# Patient Record
Sex: Female | Born: 1935 | Race: White | Hispanic: No | Smoking: Never smoker
Health system: Southern US, Community
[De-identification: ages and names within clinical notes are randomized; demographics above are authoritative.]

## PROBLEM LIST (undated history)

## (undated) DIAGNOSIS — T7840XA Allergy, unspecified, initial encounter: Secondary | ICD-10-CM

## (undated) DIAGNOSIS — Z789 Other specified health status: Secondary | ICD-10-CM

## (undated) DIAGNOSIS — D649 Anemia, unspecified: Secondary | ICD-10-CM

## (undated) DIAGNOSIS — I700 Atherosclerosis of aorta: Secondary | ICD-10-CM

## (undated) DIAGNOSIS — I4891 Unspecified atrial fibrillation: Secondary | ICD-10-CM

## (undated) DIAGNOSIS — M109 Gout, unspecified: Secondary | ICD-10-CM

## (undated) DIAGNOSIS — Z972 Presence of dental prosthetic device (complete) (partial): Secondary | ICD-10-CM

## (undated) DIAGNOSIS — I10 Essential (primary) hypertension: Secondary | ICD-10-CM

## (undated) DIAGNOSIS — H269 Unspecified cataract: Secondary | ICD-10-CM

## (undated) DIAGNOSIS — R0989 Other specified symptoms and signs involving the circulatory and respiratory systems: Secondary | ICD-10-CM

## (undated) DIAGNOSIS — Z7901 Long term (current) use of anticoagulants: Secondary | ICD-10-CM

## (undated) DIAGNOSIS — I779 Disorder of arteries and arterioles, unspecified: Secondary | ICD-10-CM

## (undated) DIAGNOSIS — E785 Hyperlipidemia, unspecified: Secondary | ICD-10-CM

## (undated) DIAGNOSIS — M810 Age-related osteoporosis without current pathological fracture: Secondary | ICD-10-CM

## (undated) DIAGNOSIS — N289 Disorder of kidney and ureter, unspecified: Secondary | ICD-10-CM

## (undated) DIAGNOSIS — N184 Chronic kidney disease, stage 4 (severe): Secondary | ICD-10-CM

## (undated) DIAGNOSIS — K449 Diaphragmatic hernia without obstruction or gangrene: Secondary | ICD-10-CM

## (undated) DIAGNOSIS — M48061 Spinal stenosis, lumbar region without neurogenic claudication: Secondary | ICD-10-CM

## (undated) DIAGNOSIS — K219 Gastro-esophageal reflux disease without esophagitis: Secondary | ICD-10-CM

## (undated) DIAGNOSIS — M1990 Unspecified osteoarthritis, unspecified site: Secondary | ICD-10-CM

## (undated) DIAGNOSIS — I2510 Atherosclerotic heart disease of native coronary artery without angina pectoris: Secondary | ICD-10-CM

## (undated) DIAGNOSIS — Z974 Presence of external hearing-aid: Secondary | ICD-10-CM

## (undated) DIAGNOSIS — K52832 Lymphocytic colitis: Secondary | ICD-10-CM

## (undated) DIAGNOSIS — N183 Chronic kidney disease, stage 3 unspecified: Secondary | ICD-10-CM

## (undated) DIAGNOSIS — G459 Transient cerebral ischemic attack, unspecified: Secondary | ICD-10-CM

## (undated) DIAGNOSIS — Z0282 Encounter for adoption services: Secondary | ICD-10-CM

## (undated) DIAGNOSIS — E739 Lactose intolerance, unspecified: Secondary | ICD-10-CM

## (undated) DIAGNOSIS — E538 Deficiency of other specified B group vitamins: Secondary | ICD-10-CM

## (undated) DIAGNOSIS — I209 Angina pectoris, unspecified: Secondary | ICD-10-CM

## (undated) HISTORY — PX: TONSILLECTOMY: SUR1361

## (undated) HISTORY — PX: TONSILLECTOMY AND ADENOIDECTOMY: SUR1326

## (undated) HISTORY — PX: TUBAL LIGATION: SHX77

## (undated) HISTORY — DX: Unspecified atrial fibrillation: I48.91

## (undated) HISTORY — DX: Deficiency of other specified B group vitamins: E53.8

## (undated) HISTORY — DX: Allergy, unspecified, initial encounter: T78.40XA

## (undated) HISTORY — PX: BUNIONECTOMY: SHX129

## (undated) HISTORY — PX: BREAST EXCISIONAL BIOPSY: SUR124

## (undated) HISTORY — PX: ABDOMINAL HYSTERECTOMY: SHX81

## (undated) HISTORY — PX: REPLACEMENT TOTAL KNEE: SUR1224

## (undated) HISTORY — DX: Hyperlipidemia, unspecified: E78.5

## (undated) HISTORY — PX: TOTAL ABDOMINAL HYSTERECTOMY W/ BILATERAL SALPINGOOPHORECTOMY: SHX83

---

## 2004-08-24 HISTORY — PX: KNEE ARTHROSCOPY: SUR90

## 2007-02-09 HISTORY — PX: LEFT HEART CATH AND CORONARY ANGIOGRAPHY: CATH118249

## 2008-02-09 DIAGNOSIS — I5189 Other ill-defined heart diseases: Secondary | ICD-10-CM

## 2008-02-09 HISTORY — PX: LEFT HEART CATH AND CORONARY ANGIOGRAPHY: CATH118249

## 2008-02-09 HISTORY — DX: Other ill-defined heart diseases: I51.89

## 2008-04-05 DIAGNOSIS — M48061 Spinal stenosis, lumbar region without neurogenic claudication: Secondary | ICD-10-CM | POA: Insufficient documentation

## 2008-07-02 HISTORY — PX: COLONOSCOPY: SHX174

## 2008-07-13 DIAGNOSIS — K52832 Lymphocytic colitis: Secondary | ICD-10-CM | POA: Insufficient documentation

## 2010-07-29 DIAGNOSIS — E538 Deficiency of other specified B group vitamins: Secondary | ICD-10-CM | POA: Insufficient documentation

## 2011-09-09 DIAGNOSIS — N184 Chronic kidney disease, stage 4 (severe): Secondary | ICD-10-CM | POA: Insufficient documentation

## 2011-11-23 DIAGNOSIS — Z789 Other specified health status: Secondary | ICD-10-CM | POA: Insufficient documentation

## 2011-12-28 DIAGNOSIS — I2510 Atherosclerotic heart disease of native coronary artery without angina pectoris: Secondary | ICD-10-CM | POA: Insufficient documentation

## 2012-01-06 DIAGNOSIS — E785 Hyperlipidemia, unspecified: Secondary | ICD-10-CM | POA: Insufficient documentation

## 2012-12-12 DIAGNOSIS — M19031 Primary osteoarthritis, right wrist: Secondary | ICD-10-CM | POA: Insufficient documentation

## 2013-09-11 DIAGNOSIS — R0989 Other specified symptoms and signs involving the circulatory and respiratory systems: Secondary | ICD-10-CM | POA: Insufficient documentation

## 2013-09-11 DIAGNOSIS — M79606 Pain in leg, unspecified: Secondary | ICD-10-CM | POA: Insufficient documentation

## 2014-03-07 DIAGNOSIS — I209 Angina pectoris, unspecified: Secondary | ICD-10-CM | POA: Insufficient documentation

## 2014-04-10 DIAGNOSIS — S62009A Unspecified fracture of navicular [scaphoid] bone of unspecified wrist, initial encounter for closed fracture: Secondary | ICD-10-CM | POA: Insufficient documentation

## 2014-04-26 DIAGNOSIS — M1710 Unilateral primary osteoarthritis, unspecified knee: Secondary | ICD-10-CM | POA: Insufficient documentation

## 2014-04-26 HISTORY — PX: TOTAL KNEE ARTHROPLASTY: SHX125

## 2014-04-27 DIAGNOSIS — Z96659 Presence of unspecified artificial knee joint: Secondary | ICD-10-CM

## 2014-05-15 DIAGNOSIS — M810 Age-related osteoporosis without current pathological fracture: Secondary | ICD-10-CM | POA: Insufficient documentation

## 2014-09-30 DIAGNOSIS — K2970 Gastritis, unspecified, without bleeding: Secondary | ICD-10-CM | POA: Insufficient documentation

## 2015-09-23 DIAGNOSIS — J0110 Acute frontal sinusitis, unspecified: Secondary | ICD-10-CM | POA: Insufficient documentation

## 2015-09-23 DIAGNOSIS — R059 Cough, unspecified: Secondary | ICD-10-CM | POA: Insufficient documentation

## 2016-02-28 DIAGNOSIS — I779 Disorder of arteries and arterioles, unspecified: Secondary | ICD-10-CM | POA: Insufficient documentation

## 2017-06-21 DIAGNOSIS — S62102D Fracture of unspecified carpal bone, left wrist, subsequent encounter for fracture with routine healing: Secondary | ICD-10-CM | POA: Insufficient documentation

## 2017-08-18 DIAGNOSIS — R21 Rash and other nonspecific skin eruption: Secondary | ICD-10-CM | POA: Insufficient documentation

## 2017-08-18 DIAGNOSIS — R490 Dysphonia: Secondary | ICD-10-CM | POA: Insufficient documentation

## 2018-04-28 DIAGNOSIS — I872 Venous insufficiency (chronic) (peripheral): Secondary | ICD-10-CM | POA: Insufficient documentation

## 2018-04-28 DIAGNOSIS — J309 Allergic rhinitis, unspecified: Secondary | ICD-10-CM | POA: Insufficient documentation

## 2018-04-28 DIAGNOSIS — K219 Gastro-esophageal reflux disease without esophagitis: Secondary | ICD-10-CM | POA: Insufficient documentation

## 2018-07-24 DIAGNOSIS — G459 Transient cerebral ischemic attack, unspecified: Secondary | ICD-10-CM

## 2018-07-24 HISTORY — DX: Transient cerebral ischemic attack, unspecified: G45.9

## 2018-08-19 ENCOUNTER — Encounter: Payer: Self-pay | Admitting: Intensive Care

## 2018-08-19 ENCOUNTER — Observation Stay: Payer: Medicare Other

## 2018-08-19 ENCOUNTER — Emergency Department: Payer: Medicare Other

## 2018-08-19 ENCOUNTER — Observation Stay
Admission: EM | Admit: 2018-08-19 | Discharge: 2018-08-20 | Disposition: A | Discharge status: Home / Self Care | Payer: Medicare Other | Attending: Internal Medicine | Admitting: Internal Medicine

## 2018-08-19 ENCOUNTER — Other Ambulatory Visit: Payer: Self-pay

## 2018-08-19 DIAGNOSIS — Z7951 Long term (current) use of inhaled steroids: Secondary | ICD-10-CM | POA: Diagnosis not present

## 2018-08-19 DIAGNOSIS — R262 Difficulty in walking, not elsewhere classified: Secondary | ICD-10-CM | POA: Insufficient documentation

## 2018-08-19 DIAGNOSIS — Z7982 Long term (current) use of aspirin: Secondary | ICD-10-CM | POA: Diagnosis not present

## 2018-08-19 DIAGNOSIS — Z66 Do not resuscitate: Secondary | ICD-10-CM | POA: Insufficient documentation

## 2018-08-19 DIAGNOSIS — Z79899 Other long term (current) drug therapy: Secondary | ICD-10-CM | POA: Diagnosis not present

## 2018-08-19 DIAGNOSIS — I10 Essential (primary) hypertension: Secondary | ICD-10-CM | POA: Diagnosis not present

## 2018-08-19 DIAGNOSIS — G459 Transient cerebral ischemic attack, unspecified: Secondary | ICD-10-CM | POA: Diagnosis not present

## 2018-08-19 DIAGNOSIS — N289 Disorder of kidney and ureter, unspecified: Secondary | ICD-10-CM | POA: Diagnosis not present

## 2018-08-19 DIAGNOSIS — I639 Cerebral infarction, unspecified: Secondary | ICD-10-CM

## 2018-08-19 DIAGNOSIS — M6281 Muscle weakness (generalized): Secondary | ICD-10-CM | POA: Insufficient documentation

## 2018-08-19 HISTORY — DX: Essential (primary) hypertension: I10

## 2018-08-19 HISTORY — DX: Disorder of kidney and ureter, unspecified: N28.9

## 2018-08-19 LAB — DIFFERENTIAL
Abs Immature Granulocytes: 0.02 10*3/uL (ref 0.00–0.07)
Basophils Absolute: 0.1 10*3/uL (ref 0.0–0.1)
Basophils Relative: 2 %
EOS ABS: 0.2 10*3/uL (ref 0.0–0.5)
Eosinophils Relative: 3 %
Immature Granulocytes: 0 %
Lymphocytes Relative: 17 %
Lymphs Abs: 1.1 10*3/uL (ref 0.7–4.0)
Monocytes Absolute: 0.6 10*3/uL (ref 0.1–1.0)
Monocytes Relative: 10 %
Neutro Abs: 4.4 10*3/uL (ref 1.7–7.7)
Neutrophils Relative %: 68 %

## 2018-08-19 LAB — COMPREHENSIVE METABOLIC PANEL
ALT: 10 U/L (ref 0–44)
AST: 19 U/L (ref 15–41)
Albumin: 4.1 g/dL (ref 3.5–5.0)
Alkaline Phosphatase: 62 U/L (ref 38–126)
Anion gap: 7 (ref 5–15)
BUN: 32 mg/dL — AB (ref 8–23)
CO2: 24 mmol/L (ref 22–32)
Calcium: 9.3 mg/dL (ref 8.9–10.3)
Chloride: 109 mmol/L (ref 98–111)
Creatinine, Ser: 1.28 mg/dL — ABNORMAL HIGH (ref 0.44–1.00)
GFR calc Af Amer: 44 mL/min — ABNORMAL LOW (ref >60)
GFR calc non Af Amer: 38 mL/min — ABNORMAL LOW (ref >60)
GLUCOSE: 103 mg/dL — AB (ref 70–99)
Potassium: 4.3 mmol/L (ref 3.5–5.1)
Sodium: 140 mmol/L (ref 135–145)
Total Bilirubin: 0.8 mg/dL (ref 0.3–1.2)
Total Protein: 6.7 g/dL (ref 6.5–8.1)

## 2018-08-19 LAB — CBC
HCT: 38.2 % (ref 36.0–46.0)
Hemoglobin: 12.2 g/dL (ref 12.0–15.0)
MCH: 31 pg (ref 26.0–34.0)
MCHC: 31.9 g/dL (ref 30.0–36.0)
MCV: 97.2 fL (ref 80.0–100.0)
PLATELETS: 295 10*3/uL (ref 150–400)
RBC: 3.93 MIL/uL (ref 3.87–5.11)
RDW: 12.9 % (ref 11.5–15.5)
WBC: 6.4 10*3/uL (ref 4.0–10.5)
nRBC: 0 % (ref 0.0–0.2)

## 2018-08-19 LAB — PROTIME-INR
INR: 1.01
Prothrombin Time: 13.2 seconds (ref 11.4–15.2)

## 2018-08-19 LAB — TROPONIN I

## 2018-08-19 LAB — APTT: aPTT: 30 seconds (ref 24–36)

## 2018-08-19 MED ORDER — ACETAMINOPHEN 160 MG/5ML PO SOLN
650.0000 mg | ORAL | Status: DC | PRN
  Filled 2018-08-19: qty 20.3

## 2018-08-19 MED ORDER — ACETAMINOPHEN 325 MG PO TABS
650.0000 mg | ORAL_TABLET | ORAL | Status: DC | PRN
  Administered 2018-08-19: 18:00:00 650 mg via ORAL
  Filled 2018-08-19: qty 2

## 2018-08-19 MED ORDER — ACETAMINOPHEN 325 MG PO TABS: 650.0000 mg | ORAL_TABLET | Freq: Once | ORAL | Status: DC

## 2018-08-19 MED ORDER — ACETAMINOPHEN 650 MG RE SUPP: 650.0000 mg | RECTAL | Status: DC | PRN

## 2018-08-19 MED ORDER — STROKE: EARLY STAGES OF RECOVERY BOOK
Freq: Once | Status: AC
  Administered 2018-08-19: 21:00:00

## 2018-08-19 MED ORDER — ASPIRIN 300 MG RE SUPP: 300.0000 mg | Freq: Every day | RECTAL | Status: DC

## 2018-08-19 MED ORDER — ASPIRIN 325 MG PO TABS
325.0000 mg | ORAL_TABLET | Freq: Every day | ORAL | Status: DC
  Administered 2018-08-19 – 2018-08-20 (×2): 325 mg via ORAL
  Filled 2018-08-19 (×2): qty 1

## 2018-08-19 MED ORDER — ENOXAPARIN SODIUM 30 MG/0.3ML ~~LOC~~ SOLN
30.0000 mg | SUBCUTANEOUS | Status: DC
  Administered 2018-08-19: 21:00:00 30 mg via SUBCUTANEOUS
  Filled 2018-08-19: qty 0.3

## 2018-08-19 NOTE — [facility] Notes (Signed)
Advanced care plan.  Purpose of the Encounter: CODE STATUS  Parties in Attendance: Patient and family  Patient's Decision Capacity: Good  Subjective/Patient's story: Presented to the emergency room because of difficulty getting words out   Objective/Medical story Needs stroke work-up Needs MRI brain, carotid ultrasound echocardiogram Needs neurology evaluation  Goals of care determination:  Advance care directives goals of care and treatment plan discussed Patient does not want CPR, intubation ventilator if the need arises   CODE STATUS: DNR   Time spent discussing advanced care planning: 16 minutes

## 2018-08-19 NOTE — ED Notes (Signed)
Pt up to toilet 

## 2018-08-19 NOTE — ED Provider Notes (Addendum)
[facility] Emergency Department Provider Note  Time seen: 12:00 PM  I have reviewed the triage vital signs and the nursing notes.   HISTORY  Chief Complaint Altered Mental Status    HPI Brittney [name] is a 84 y.o. female with a past medical history of hypertension, presents to the emergency department for difficulty speaking.  According to the patient and family at approximate 9:30 AM this morning patient had acute onset of difficulty speaking and developed a headache.  Patient states she was trying to talk but cannot get words out.  Patient's family states she was speaking incorrectly.  Was speaking very slowly and using the wrong words but the patient could not tell that she was using the wrong words.  Symptoms lasted approximately 30 to 45 minutes and then resolved.  No history of stroke or mini stroke in the past.  Patient takes a baby aspirin every morning as her only anticoagulation.  Patient states she feels back to normal now besides a very mild headache.   Past Medical History:  Diagnosis Date  . Hypertension   . Renal disorder     There are no active problems to display for this patient.   History reviewed. No pertinent surgical history.  Prior to Admission medications   Not on File    Allergies  Allergen Reactions  . Azithromycin   . Baclofen   . Iodine     CKD  . Lisinopril   . Sulfa Antibiotics     History reviewed. No pertinent family history.  Social History Social History   Tobacco Use  . Smoking status: Never Smoker  . Smokeless tobacco: Never Used  Substance Use Topics  . Alcohol use: Not Currently    Frequency: Never  . Drug use: Never    Review of Systems Constitutional: Negative for fever Cardiovascular: Negative for chest pain. Respiratory: Negative for shortness of breath. Gastrointestinal: Negative for abdominal pain, vomiting  Musculoskeletal: Negative for musculoskeletal complaints Skin: Negative for  skin complaints  Neurological: Mild headache All other ROS negative  ____________________________________________   PHYSICAL EXAM:  VITAL SIGNS: ED Triage Vitals  Enc Vitals Group     BP 08/19/18 1044 (!) 195/73     Pulse Rate 08/19/18 1044 63     Resp 08/19/18 1044 18     Temp 08/19/18 1044 (!) 97.4 F (36.3 C)     Temp Source 08/19/18 1044 Oral     SpO2 08/19/18 1044 97 %     Weight 08/19/18 1045 145 lb (65.8 kg)     Height 08/19/18 1045 5' (1.524 m)     Head Circumference --      Peak Flow --      Pain Score 08/19/18 1045 5     Pain Loc --      Pain Edu? --      Excl. in [place]? --    Constitutional: Alert and oriented. Well appearing and in no distress. Eyes: Normal exam ENT   Head: Normocephalic and atraumatic.   Mouth/Throat: Mucous membranes are moist. Cardiovascular: Normal rate, regular rhythm. Respiratory: Normal respiratory effort without tachypnea nor retractions. Breath sounds are clear  Gastrointestinal: Soft and nontender. No distention.   Musculoskeletal: Nontender with normal range of motion in all extremities. Neurologic:  Normal speech and language. No gross focal neurologic deficits.  Equal grip strength bilaterally.  No pronator drift.  5/5 motor in all extremities.  No lower extremity drift.  Clear speech.  Cranial nerves intact. Skin:  Skin is warm, dry and intact.  Psychiatric: Mood and affect are normal.   ____________________________________________    EKG  EKG viewed and interpreted by myself shows a normal sinus rhythm at 64 bpm with a slightly widened QRS, left axis deviation, largely normal intervals, nonspecific ST changes.  ____________________________________________    RADIOLOGY  CT scan of the head is negative  ____________________________________________   INITIAL IMPRESSION / ASSESSMENT AND PLAN / ED COURSE  Pertinent labs & imaging results that were available during my care of the patient were reviewed by me and  considered in my medical decision making (see chart for details).  Patient presents to the emergency department with acute onset of speech difficulty at 9:30 AM today.  Differential would include TIA, CVA, ICH, complex migraine.  Patient CT scan is negative, exam including neurological exam is reassuring.  Highly suspect transient ischemic attack.  NIH stroke scale of 0 currently.  Lab work is been nonrevealing.  Patient will be admitted to the hospital service for continued work-up.  Patient and family agreeable to plan of care.  ____________________________________________   FINAL CLINICAL IMPRESSION(S) / ED DIAGNOSES  TIA   Harvest Dark, MD 08/19/18 1203    Harvest Dark, MD 08/19/18 1204

## 2018-08-19 NOTE — ED Triage Notes (Signed)
Patient reports around 0930 having confusion, aphasia, and daughter reports some slurred speech. Patients only c/o now is slight headache. Speech is clear. No weakness. No facial droop. A&O x4 at this time. HX CKD

## 2018-08-19 NOTE — ED Notes (Signed)
Informed RN Jinny Blossom of being in code red surge and that patient would need to be transported by 1C. RN Jinny Blossom stated they will be down shorlty

## 2018-08-19 NOTE — H&P (Signed)
[place] [facility] NAME: Brittney [name]    MR#:  [id number]  DATE OF BIRTH:  [birth year]  DATE OF ADMISSION:  08/19/2018  PRIMARY CARE PHYSICIAN: Derinda Late, MD   REQUESTING/REFERRING PHYSICIAN:   CHIEF COMPLAINT:   Chief Complaint  Patient presents with  . Altered Mental Status    HISTORY OF PRESENT ILLNESS: Brittney [name]  is a 84 y.o. female with a known history of hypertension presented to the emergency room because of confusion this morning.  Patient around 9:30 AM was confused for a transient period of time.  At that.  Of time she was also not able to express words.  No complaints of any tingling numbness in any part of the body .  Was evaluated with CT head which showed no acute abnormality in the emergency room.  Hospitalist service was consulted for further care.  Mental status back to baseline in the emergency room.  PAST MEDICAL HISTORY:   Past Medical History:  Diagnosis Date  . Hypertension   . Renal disorder     PAST SURGICAL HISTORY: Knee replacement Hysterectomy  SOCIAL HISTORY:  Social History   Tobacco Use  . Smoking status: Never Smoker  . Smokeless tobacco: Never Used  Substance Use Topics  . Alcohol use: Not Currently    Frequency: Never    FAMILY HISTORY: Patient is adopted does not know family history  DRUG ALLERGIES:  Allergies  Allergen Reactions  . Azithromycin   . Baclofen   . Iodine     CKD  . Lisinopril   . Sulfa Antibiotics     REVIEW OF SYSTEMS:   CONSTITUTIONAL: No fever, fatigue or weakness.  EYES: No blurred or double vision.  EARS, NOSE, AND THROAT: No tinnitus or ear pain.  RESPIRATORY: No cough, shortness of breath, wheezing or hemoptysis.  CARDIOVASCULAR: No chest pain, orthopnea, edema.  GASTROINTESTINAL: No nausea, vomiting, diarrhea or abdominal pain.  GENITOURINARY: No dysuria, hematuria.  ENDOCRINE: No polyuria, nocturia,  HEMATOLOGY: No anemia, easy  bruising or bleeding SKIN: No rash or lesion. MUSCULOSKELETAL: No joint pain or arthritis.   NEUROLOGIC: No tingling, numbness, weakness. Had difficulty in expressing words this morning PSYCHIATRY: No anxiety or depression.   MEDICATIONS AT HOME:  Prior to Admission medications   Not on File      PHYSICAL EXAMINATION:   VITAL SIGNS: Blood pressure (!) 175/88, pulse 69, temperature (!) 97.4 F (36.3 C), temperature source Oral, resp. rate 17, height 5' (1.524 m), weight 65.8 kg, SpO2 96 %.  GENERAL:  84 y.o.-year-old patient lying in the bed with no acute distress.  EYES: Pupils equal, round, reactive to light and accommodation. No scleral icterus. Extraocular muscles intact.  HEENT: Head atraumatic, normocephalic. Oropharynx and nasopharynx clear.  NECK:  Supple, no jugular venous distention. No thyroid enlargement, no tenderness.  LUNGS: Normal breath sounds bilaterally, no wheezing, rales,rhonchi or crepitation. No use of accessory muscles of respiration.  CARDIOVASCULAR: S1, S2 normal. No murmurs, rubs, or gallops.  ABDOMEN: Soft, nontender, nondistended. Bowel sounds present. No organomegaly or mass.  EXTREMITIES: No pedal edema, cyanosis, or clubbing.  NEUROLOGIC: Cranial nerves II through XII are intact. Muscle strength 5/5 in all extremities. Sensation intact. Gait not checked.  PSYCHIATRIC: The patient is alert and oriented x 3.  SKIN: No obvious rash, lesion, or ulcer.   LABORATORY PANEL:   CBC Recent Labs  Lab 08/19/18 1057  WBC 6.4  HGB 12.2  HCT 38.2  PLT 295  MCV 97.2  MCH 31.0  MCHC 31.9  RDW 12.9  LYMPHSABS 1.1  MONOABS 0.6  EOSABS 0.2  BASOSABS 0.1   ------------------------------------------------------------------------------------------------------------------  Chemistries  Recent Labs  Lab 08/19/18 1057  NA 140  K 4.3  CL 109  CO2 24  GLUCOSE 103*  BUN 32*  CREATININE 1.28*  CALCIUM 9.3  AST 19  ALT 10  ALKPHOS 62  BILITOT 0.8    ------------------------------------------------------------------------------------------------------------------ estimated creatinine clearance is 26.7 mL/min (A) (by C-G formula based on SCr of 1.28 mg/dL (H)). ------------------------------------------------------------------------------------------------------------------ No results for input(s): TSH, T4TOTAL, T3FREE, THYROIDAB in the last 72 hours.  Invalid input(s): FREET3   Coagulation profile Recent Labs  Lab 08/19/18 1057  INR 1.01   ------------------------------------------------------------------------------------------------------------------- No results for input(s): DDIMER in the last 72 hours. -------------------------------------------------------------------------------------------------------------------  Cardiac Enzymes Recent Labs  Lab 08/19/18 1057  TROPONINI <0.03   ------------------------------------------------------------------------------------------------------------------ Invalid input(s): POCBNP  ---------------------------------------------------------------------------------------------------------------  Urinalysis No results found for: COLORURINE, APPEARANCEUR, LABSPEC, PHURINE, GLUCOSEU, HGBUR, BILIRUBINUR, KETONESUR, PROTEINUR, UROBILINOGEN, NITRITE, LEUKOCYTESUR   RADIOLOGY: Ct Head Wo Contrast  Result Date: 08/19/2018 CLINICAL DATA:  Headache.  Confusion this morning. EXAM: CT HEAD WITHOUT CONTRAST TECHNIQUE: Contiguous axial images were obtained from the base of the skull through the vertex without intravenous contrast. COMPARISON:  None. FINDINGS: Brain: No evidence of acute infarction, hemorrhage, hydrocephalus, extra-axial collection or mass lesion/mass effect. Mild periventricular white matter hypoattenuation is noted consistent with chronic microvascular ischemic change. Vascular: No hyperdense vessel or unexpected calcification. Skull: Normal. Negative for fracture or focal  lesion. Sinuses/Orbits: Globes and orbits are unremarkable. Visualized sinuses and mastoid air cells are clear. Other: None. IMPRESSION: 1. No acute intracranial abnormalities. 2. Mild chronic microvascular ischemic change. Electronically Signed   By: Lajean Manes M.D.   On: 08/19/2018 11:09    EKG: Orders placed or performed during the hospital encounter of 08/19/18  . ED EKG  . ED EKG    IMPRESSION AND PLAN:  84 year old elderly female patient with history of high blood pressure presented to the emergency room with difficulty expressing words for a transient period of time.  This happened this morning.  -Transient ischemic attack Admit patient to observation bed Check MRI brain, MRA brain Check carotid ultrasound echocardiogram Neurology consultation Oral aspirin  -Hypertension In view of stroke allow blood pressure to be around 401 systolic and 85 mmHg or diastolic Monitor blood pressure closely  -Headache PRN Tylenol  -DVT prophylaxis subcu Lovenox daily   All the records are reviewed and case discussed with ED provider. Management plans discussed with the patient, family and they are in agreement.  CODE STATUS:DNR    Code Status Orders  (From admission, onward)         Start     Ordered   08/19/18 1320  Do not attempt resuscitation (DNR)  Continuous    Question Answer Comment  In the event of cardiac or respiratory ARREST Do not call a "code blue"   In the event of cardiac or respiratory ARREST Do not perform Intubation, CPR, defibrillation or ACLS   In the event of cardiac or respiratory ARREST Use medication by any route, position, wound care, and other measures to relive pain and suffering. May use oxygen, suction and manual treatment of airway obstruction as needed for comfort.      08/19/18 1319        Code Status History    This patient has a current code status but no historical code status.  Advance Directive Documentation     Most Recent Value   Type of Advance Directive  Living will  Pre-existing out of facility DNR order (yellow form or pink MOST form)  -  "MOST" Form in Place?  -       TOTAL TIME TAKING CARE OF THIS PATIENT: 53 minutes.    Saundra Shelling M.D on 08/19/2018 at 1:26 PM  Between 7am to 6pm - Pager - [phone]  After 6pm go to [email or web address] - password [email or web address] [facility]  [place] [facility]  Office  [phone]  CC: Primary care physician; Derinda Late, MD

## 2018-08-19 NOTE — ED Notes (Signed)
Attempted to call report, per RN Caryl Pina and Charge the patient has not been assigned to a RN yet and the bed has not yet been approved. Call back in 5 minutes. Will inform RN Charge Levada Dy

## 2018-08-19 NOTE — Consult Note (Signed)
Referring Physician: Pyreddy    Chief Complaint: Difficulty with speech  HPI: Brittney [name] is an 84 y.o. female with a history of HTN who reports that this morning she had the acute onset of difficulty with speech.  She knew what she wanted to say but the correct words would not come out.  Patient developed a headache as well.  Symptoms lasted about 45 minutes and resolved leaving her with the headache.  Initial NIHSS of 0.  Date last known well: Date: 08/19/2018 Time last known well: Time: 09:30 tPA Given: No: Resolution of symptoms  Past Medical History:  Diagnosis Date  . Hypertension   . Renal disorder     History reviewed. No pertinent surgical history.  Family history: Both parents deceased from old age.  Social History:  reports that she has never smoked. She has never used smokeless tobacco. She reports previous alcohol use. She reports that she does not use drugs.  Allergies:  Allergies  Allergen Reactions  . Azithromycin   . Baclofen   . Iodine     CKD  . Lisinopril   . Sulfa Antibiotics     Medications: I have reviewed the patient's current medications. Prior to Admission:  Prior to Admission medications   Medication Sig Start Date End Date Taking? Authorizing Provider  aspirin EC 81 MG tablet Take 81 mg by mouth daily.    Yes [provider]  atenolol (TENORMIN) 25 MG tablet Take 25 mg by mouth every evening. 03/04/18  Yes [provider]  Cholecalciferol (VITAMIN D3) 25 MCG (1000 UT) CAPS Take 1,000 Units by mouth daily.   Yes [provider]  cyanocobalamin (,VITAMIN B-12,) 1000 MCG/ML injection Inject 1,000 mcg into the muscle every 30 (thirty) days.   Yes [provider]  fluticasone (FLONASE) 50 MCG/ACT nasal spray Place 2 sprays into the nose daily.   Yes [provider]  furosemide (LASIX) 20 MG tablet Take 20 mg by mouth daily. 03/04/18  Yes [provider]  loratadine (CLARITIN) 10 MG tablet Take  10 mg by mouth daily.   Yes [provider]  Olopatadine HCl 0.7 % SOLN Apply 1 drop to eye daily.   Yes [provider]  ranitidine (ZANTAC) 150 MG tablet Take 150 mg by mouth 2 (two) times daily.   Yes [provider]  rosuvastatin (CRESTOR) 20 MG tablet Take 20 mg by mouth every evening.  03/04/18  Yes [provider]   ROS: History obtained from the patient  General ROS: negative for - chills, fatigue, fever, night sweats, weight gain or weight loss Psychological ROS: negative for - behavioral disorder, hallucinations, memory difficulties, mood swings or suicidal ideation Ophthalmic ROS: negative for - blurry vision, double vision, eye pain or loss of vision ENT ROS: negative for - epistaxis, nasal discharge, oral lesions, sore throat, tinnitus or vertigo Allergy and Immunology ROS: negative for - hives or itchy/watery eyes Hematological and Lymphatic ROS: negative for - bleeding problems, bruising or swollen lymph nodes Endocrine ROS: negative for - galactorrhea, hair pattern changes, polydipsia/polyuria or temperature intolerance Respiratory ROS: negative for - cough, hemoptysis, shortness of breath or wheezing Cardiovascular ROS: negative for - chest pain, dyspnea on exertion, edema or irregular heartbeat Gastrointestinal ROS: negative for - abdominal pain, diarrhea, hematemesis, nausea/vomiting or stool incontinence Genito-Urinary ROS: negative for - dysuria, hematuria, incontinence or urinary frequency/urgency Musculoskeletal ROS: negative for - joint swelling or muscular weakness Neurological ROS: as noted in HPI Dermatological ROS: negative for rash  and skin lesion changes  Physical Examination: Blood pressure (!) 193/68, pulse (!) 56, temperature (!) 97.4 F (36.3 C), temperature source Oral, resp. rate 10, height 5' (1.524 m), weight 65.8 kg, SpO2 97 %.  HEENT-  Normocephalic, no lesions, without obvious abnormality.  Normal external eye and  conjunctiva.  Normal TM's bilaterally.  Normal auditory canals and external ears. Normal external nose, mucus membranes and septum.  Normal pharynx. Cardiovascular- S1, S2 normal, pulses palpable throughout   Lungs- chest clear, no wheezing, rales, normal symmetric air entry Abdomen- soft, non-tender; bowel sounds normal; no masses,  no organomegaly Extremities- no edema Lymph-no adenopathy palpable Musculoskeletal-no joint tenderness, deformity or swelling Skin-warm and dry, no hyperpigmentation, vitiligo, or suspicious lesions  Neurological Examination   Mental Status: Alert, oriented, thought content appropriate.  Speech fluent without evidence of aphasia.  Able to follow 3 step commands without difficulty. Cranial Nerves: II: Discs flat bilaterally; Visual fields grossly normal, pupils equal, round, reactive to light and accommodation III,IV, VI: ptosis not present, extra-ocular motions intact bilaterally V,VII: smile symmetric, facial light touch sensation normal bilaterally VIII: hearing normal bilaterally IX,X: gag reflex present XI: bilateral shoulder shrug XII: midline tongue extension Motor: Right : Upper extremity   5/5    Left:     Upper extremity   5/5  Lower extremity   5/5     Lower extremity   5/5 Tone and bulk:normal tone throughout; no atrophy noted Sensory: Pinprick and light touch intact throughout, bilaterally Deep Tendon Reflexes: 2+ and symmetric with 1+ AJ's bilaterally Plantars: Right: downgoing   Left: downgoing Cerebellar: Normal finger-to-nose and normal heel-to-shin testing bilaterally Gait: not tested due to safety concerns    Laboratory Studies:  Basic Metabolic Panel: Recent Labs  Lab 08/19/18 1057  NA 140  K 4.3  CL 109  CO2 24  GLUCOSE 103*  BUN 32*  CREATININE 1.28*  CALCIUM 9.3    Liver Function Tests: Recent Labs  Lab 08/19/18 1057  AST 19  ALT 10  ALKPHOS 62  BILITOT 0.8  PROT 6.7  ALBUMIN 4.1   No results for input(s):  LIPASE, AMYLASE in the last 168 hours. No results for input(s): AMMONIA in the last 168 hours.  CBC: Recent Labs  Lab 08/19/18 1057  WBC 6.4  NEUTROABS 4.4  HGB 12.2  HCT 38.2  MCV 97.2  PLT 295    Cardiac Enzymes: Recent Labs  Lab 08/19/18 1057  TROPONINI <0.03    BNP: Invalid input(s): POCBNP  CBG: No results for input(s): GLUCAP in the last 168 hours.  Microbiology: No results found for this or any previous visit.  Coagulation Studies: Recent Labs    08/19/18 1057  LABPROT 13.2  INR 1.01    Urinalysis: No results for input(s): COLORURINE, LABSPEC, PHURINE, GLUCOSEU, HGBUR, BILIRUBINUR, KETONESUR, PROTEINUR, UROBILINOGEN, NITRITE, LEUKOCYTESUR in the last 168 hours.  Invalid input(s): APPERANCEUR  Lipid Panel: No results found for: CHOL, TRIG, HDL, CHOLHDL, VLDL, LDLCALC  HgbA1C: No results found for: HGBA1C  Urine Drug Screen:  No results found for: LABOPIA, COCAINSCRNUR, LABBENZ, AMPHETMU, THCU, LABBARB  Alcohol Level: No results for input(s): ETH in the last 168 hours.  Other results: EKG: sinus rhythm at 64 bpm with premature atrial complexes.  Imaging: Ct Head Wo Contrast  Result Date: 08/19/2018 CLINICAL DATA:  Headache.  Confusion this morning. EXAM: CT HEAD WITHOUT CONTRAST TECHNIQUE: Contiguous axial images were obtained from the base of the skull through the vertex without intravenous contrast. COMPARISON:  None.  FINDINGS: Brain: No evidence of acute infarction, hemorrhage, hydrocephalus, extra-axial collection or mass lesion/mass effect. Mild periventricular white matter hypoattenuation is noted consistent with chronic microvascular ischemic change. Vascular: No hyperdense vessel or unexpected calcification. Skull: Normal. Negative for fracture or focal lesion. Sinuses/Orbits: Globes and orbits are unremarkable. Visualized sinuses and mastoid air cells are clear. Other: None. IMPRESSION: 1. No acute intracranial abnormalities. 2. Mild chronic  microvascular ischemic change. Electronically Signed   By: Lajean Manes M.D.   On: 08/19/2018 11:09    Assessment: 84 y.o. female with history of HTN presenting after an episode of difficulty with speech.  Patient now back to baseline.  On ASA daily.  Head CT reviewed and shows no acute changes.  TIA suspected.  Further work up recommended.    Stroke Risk Factors - hypertension  Plan: 1. HgbA1c, fasting lipid panel 2. MRI, MRA  of the brain without contrast 3. PT consult, OT consult, Speech consult 4. Echocardiogram 5. Carotid dopplers 6. Prophylactic therapy-ASA 81mg  and Plavix 75mg  daily 7. NPO until RN stroke swallow screen 8. Telemetry monitoring 9. Frequent neuro checks 10. Patient with elevated BP at this time.  Recommend permissive BP management at least for 24 hours.  Would attempt to treat HA with Tylenol and improvement in pain may help with BP   Alexis Goodell, MD Neurology [phone] 08/19/2018, 2:22 PM

## 2018-08-19 NOTE — [facility] Notes (Signed)
Anticoagulation monitoring(Lovenox):   84 yo female ordered Lovenox 40 mg Q24h  Filed Weights   08/19/18 1045  Weight: 145 lb (65.8 kg)   BMI    Lab Results  Component Value Date   CREATININE 1.28 (H) 08/19/2018   Estimated Creatinine Clearance: 26.7 mL/min (A) (by C-G formula based on SCr of 1.28 mg/dL (H)). Hemoglobin & Hematocrit     Component Value Date/Time   HGB 12.2 08/19/2018 1057   HCT 38.2 08/19/2018 1057     Per Protocol for Patient with estCrcl < 30 ml/min and BMI < 40, will transition to Lovenox 30 mg Q24h.

## 2018-08-19 NOTE — ED Notes (Signed)
First Nurse Note: Patient alert and oriented X 4, complaining of sudden onset of dizziness and confusion approx. 1 hr. Ago.  No arm drift noted.  Ambulatory without assistance.  Declines WC.

## 2018-08-19 NOTE — ED Notes (Signed)
Patient transported to CT 

## 2018-08-20 ENCOUNTER — Observation Stay
Admit: 2018-08-20 | Discharge: 2018-08-20 | Disposition: A | Discharge status: Home / Self Care | Payer: Medicare Other | Attending: Internal Medicine | Admitting: Internal Medicine

## 2018-08-20 DIAGNOSIS — G459 Transient cerebral ischemic attack, unspecified: Secondary | ICD-10-CM | POA: Diagnosis not present

## 2018-08-20 LAB — ECHOCARDIOGRAM COMPLETE
Height: 60 in
Weight: 2320 oz

## 2018-08-20 LAB — LIPID PANEL
Cholesterol: 129 mg/dL (ref 0–200)
HDL: 60 mg/dL (ref >40)
LDL CALC: 55 mg/dL (ref 0–99)
Total CHOL/HDL Ratio: 2.2 RATIO
Triglycerides: 68 mg/dL (ref <150)
VLDL: 14 mg/dL (ref 0–40)

## 2018-08-20 LAB — HEMOGLOBIN A1C
Hgb A1c MFr Bld: 5.4 % (ref 4.8–5.6)
Mean Plasma Glucose: 108.28 mg/dL

## 2018-08-20 MED ORDER — CLOPIDOGREL BISULFATE 75 MG PO TABS: 75.0000 mg | ORAL_TABLET | Freq: Every day | ORAL | 0 refills | Status: AC

## 2018-08-20 NOTE — Progress Notes (Signed)
Subjective: Patient at baseline.  No new neurological complaints.    Objective: Current vital signs: BP (!) 141/73 (BP Location: Right Arm)   Pulse (!) 59   Temp (!) 97.5 F (36.4 C) (Oral)   Resp 20   Ht 5' (1.524 m)   Wt 65.8 kg   SpO2 98%   BMI 28.32 kg/m  Vital signs in last 24 hours: Temp:  [97.4 F (36.3 C)-98.9 F (37.2 C)] 97.5 F (36.4 C) (12/28 8502) Pulse Rate:  [53-70] 59 (12/28 0923) Resp:  [10-20] 20 (12/28 0923) BP: (141-199)/(49-88) 141/73 (12/28 0923) SpO2:  [93 %-100 %] 98 % (12/28 0923) Weight:  [65.8 kg] 65.8 kg (12/27 1045)  Intake/Output from previous day: 12/27 0701 - 12/28 0700 In: -  Out: 1 [Urine:1] Intake/Output this shift: No intake/output data recorded. Nutritional status:  Diet Order            Diet Heart Room service appropriate? Yes; Fluid consistency: Thin  Diet effective now              Neurologic Exam: Mental Status: Alert, oriented, thought content appropriate.  Speech fluent without evidence of aphasia.  Able to follow 3 step commands without difficulty. Cranial Nerves: II: Discs flat bilaterally; Visual fields grossly normal, pupils equal, round, reactive to light and accommodation III,IV, VI: ptosis not present, extra-ocular motions intact bilaterally V,VII: smile symmetric, facial light touch sensation normal bilaterally VIII: hearing normal bilaterally IX,X: gag reflex present XI: bilateral shoulder shrug XII: midline tongue extension Motor: 5/5 throughout Sensory: Pinprick and light touch intact throughout, bilaterally Gait: normal gait and station   Lab Results: Basic Metabolic Panel: Recent Labs  Lab 08/19/18 1057  NA 140  K 4.3  CL 109  CO2 24  GLUCOSE 103*  BUN 32*  CREATININE 1.28*  CALCIUM 9.3    Liver Function Tests: Recent Labs  Lab 08/19/18 1057  AST 19  ALT 10  ALKPHOS 62  BILITOT 0.8  PROT 6.7  ALBUMIN 4.1   No results for input(s): LIPASE, AMYLASE in the last 168 hours. No  results for input(s): AMMONIA in the last 168 hours.  CBC: Recent Labs  Lab 08/19/18 1057  WBC 6.4  NEUTROABS 4.4  HGB 12.2  HCT 38.2  MCV 97.2  PLT 295    Cardiac Enzymes: Recent Labs  Lab 08/19/18 1057  TROPONINI <0.03    Lipid Panel: Recent Labs  Lab 08/20/18 0343  CHOL 129  TRIG 68  HDL 60  CHOLHDL 2.2  VLDL 14  LDLCALC 55    CBG: No results for input(s): GLUCAP in the last 168 hours.  Microbiology: No results found for this or any previous visit.  Coagulation Studies: Recent Labs    08/19/18 1057  LABPROT 13.2  INR 1.01    Imaging: Ct Head Wo Contrast  Result Date: 08/19/2018 CLINICAL DATA:  Headache.  Confusion this morning. EXAM: CT HEAD WITHOUT CONTRAST TECHNIQUE: Contiguous axial images were obtained from the base of the skull through the vertex without intravenous contrast. COMPARISON:  None. FINDINGS: Brain: No evidence of acute infarction, hemorrhage, hydrocephalus, extra-axial collection or mass lesion/mass effect. Mild periventricular white matter hypoattenuation is noted consistent with chronic microvascular ischemic change. Vascular: No hyperdense vessel or unexpected calcification. Skull: Normal. Negative for fracture or focal lesion. Sinuses/Orbits: Globes and orbits are unremarkable. Visualized sinuses and mastoid air cells are clear. Other: None. IMPRESSION: 1. No acute intracranial abnormalities. 2. Mild chronic microvascular ischemic change. Electronically Signed   By: Shanon Brow  Ormond M.D.   On: 08/19/2018 11:09   Mr Brain Wo Contrast  Result Date: 08/19/2018 CLINICAL DATA:  Initial evaluation for acute speech difficulty. EXAM: MRI HEAD WITHOUT CONTRAST MRA HEAD WITHOUT CONTRAST TECHNIQUE: Multiplanar, multiecho pulse sequences of the brain and surrounding structures were obtained without intravenous contrast. Angiographic images of the head were obtained using MRA technique without contrast. COMPARISON:  Prior CT from earlier the same day.  FINDINGS: MRI HEAD FINDINGS Brain: Age-appropriate cerebral atrophy. Patchy and confluent T2/FLAIR hyperintensity within the periventricular deep white matter both cerebral hemispheres most consistent with chronic microvascular ischemic disease, mild for age. No abnormal foci of restricted diffusion to suggest acute or subacute ischemia. Gray-white matter differentiation maintained. No encephalomalacia to suggest chronic cortical infarction. No evidence for acute or chronic intracranial hemorrhage. No mass lesion, midline shift or mass effect. No hydrocephalus. No extra-axial fluid collection. Pituitary gland normal. Vascular: Major intravascular flow voids maintained. Skull and upper cervical spine: Craniocervical junction normal. Upper cervical spine within normal limits. Bone marrow signal intensity normal. No scalp soft tissue abnormality. Sinuses/Orbits: Patient status post bilateral ocular lens replacement. Paranasal sinuses are clear. No mastoid effusion. Inner ear structures normal. Other: None. MRA HEAD FINDINGS ANTERIOR CIRCULATION: Distal cervical segments of the internal carotid arteries are patent with antegrade flow. Petrous, cavernous, and supraclinoid segments patent without hemodynamically significant stenosis. A1 segments widely patent bilaterally. Patent and normal anterior communicating artery. Anterior cerebral arteries patent to their distal aspects without flow-limiting stenosis. M1 segments patent bilaterally. Normal MCA bifurcations. No proximal M2 occlusion. Distal MCA branches well perfused and symmetric. POSTERIOR CIRCULATION: Vertebral arteries patent to the vertebrobasilar junction without stenosis. Posterior inferior cerebral arteries patent bilaterally. Basilar widely patent to its distal aspect. Superior cerebral arteries patent bilaterally. Both of the posterior cerebral arteries primarily supplied via the basilar and are well perfused to their distal aspects. IMPRESSION: MRI HEAD  IMPRESSION: 1. No acute intracranial infarct or other abnormality. 2. Mild chronic microvascular ischemic disease for age. MRA HEAD IMPRESSION: Negative intracranial MRA. No large vessel occlusion. No hemodynamically significant or correctable stenosis. Electronically Signed   By: Jeannine Boga M.D.   On: 08/19/2018 18:11   US Carotid Bilateral (at [facility] And [facility] Only)  Result Date: 08/19/2018 CLINICAL DATA:  Cerebrovascular accident. EXAM: BILATERAL CAROTID DUPLEX ULTRASOUND TECHNIQUE: Pearline Cables scale imaging, color Doppler and duplex ultrasound were performed of bilateral carotid and vertebral arteries in the neck. COMPARISON:  None. FINDINGS: Criteria: Quantification of carotid stenosis is based on velocity parameters that correlate the residual internal carotid diameter with NASCET-based stenosis levels, using the diameter of the distal internal carotid lumen as the denominator for stenosis measurement. The following velocity measurements were obtained: RIGHT ICA: 75/15 cm/sec CCA: 73/53 cm/sec SYSTOLIC ICA/CCA RATIO:  1.0 ECA: 69 cm/sec LEFT ICA: 137/28 cm/sec CCA: 29/92 cm/sec SYSTOLIC ICA/CCA RATIO:  1.9 ECA: 69 cm/sec RIGHT CAROTID ARTERY: Minimal plaque formation is noted in the right carotid bulb. RIGHT VERTEBRAL ARTERY:  Antegrade flow is noted. LEFT CAROTID ARTERY: Minimal plaque formation is noted in the proximal left internal carotid artery. LEFT VERTEBRAL ARTERY:  Antegrade flow is noted. IMPRESSION: No hemodynamically significant stenosis is noted in either cervical carotid artery. Electronically Signed   By: Marijo Conception, M.D.   On: 08/19/2018 20:51   Mr Jodene Nam Head/brain EQ Cm  Result Date: 08/19/2018 CLINICAL DATA:  Initial evaluation for acute speech difficulty. EXAM: MRI HEAD WITHOUT CONTRAST MRA HEAD WITHOUT CONTRAST TECHNIQUE: Multiplanar, multiecho pulse sequences of the brain and  surrounding structures were obtained without intravenous contrast. Angiographic images of the head were  obtained using MRA technique without contrast. COMPARISON:  Prior CT from earlier the same day. FINDINGS: MRI HEAD FINDINGS Brain: Age-appropriate cerebral atrophy. Patchy and confluent T2/FLAIR hyperintensity within the periventricular deep white matter both cerebral hemispheres most consistent with chronic microvascular ischemic disease, mild for age. No abnormal foci of restricted diffusion to suggest acute or subacute ischemia. Gray-white matter differentiation maintained. No encephalomalacia to suggest chronic cortical infarction. No evidence for acute or chronic intracranial hemorrhage. No mass lesion, midline shift or mass effect. No hydrocephalus. No extra-axial fluid collection. Pituitary gland normal. Vascular: Major intravascular flow voids maintained. Skull and upper cervical spine: Craniocervical junction normal. Upper cervical spine within normal limits. Bone marrow signal intensity normal. No scalp soft tissue abnormality. Sinuses/Orbits: Patient status post bilateral ocular lens replacement. Paranasal sinuses are clear. No mastoid effusion. Inner ear structures normal. Other: None. MRA HEAD FINDINGS ANTERIOR CIRCULATION: Distal cervical segments of the internal carotid arteries are patent with antegrade flow. Petrous, cavernous, and supraclinoid segments patent without hemodynamically significant stenosis. A1 segments widely patent bilaterally. Patent and normal anterior communicating artery. Anterior cerebral arteries patent to their distal aspects without flow-limiting stenosis. M1 segments patent bilaterally. Normal MCA bifurcations. No proximal M2 occlusion. Distal MCA branches well perfused and symmetric. POSTERIOR CIRCULATION: Vertebral arteries patent to the vertebrobasilar junction without stenosis. Posterior inferior cerebral arteries patent bilaterally. Basilar widely patent to its distal aspect. Superior cerebral arteries patent bilaterally. Both of the posterior cerebral arteries  primarily supplied via the basilar and are well perfused to their distal aspects. IMPRESSION: MRI HEAD IMPRESSION: 1. No acute intracranial infarct or other abnormality. 2. Mild chronic microvascular ischemic disease for age. MRA HEAD IMPRESSION: Negative intracranial MRA. No large vessel occlusion. No hemodynamically significant or correctable stenosis. Electronically Signed   By: Jeannine Boga M.D.   On: 08/19/2018 18:11    Medications:  I have reviewed the patient's current medications. Scheduled: . acetaminophen  650 mg Oral Once  . aspirin  300 mg Rectal Daily   Or  . aspirin  325 mg Oral Daily  . enoxaparin (LOVENOX) injection  30 mg Subcutaneous Q24H    Assessment/Plan: No new neurological complaints.  MRI of the brain reviewed and shows no acute changes.  Suspect TIA as etiology for presentation.   Carotid dopplers show no evidence of hemodynamically significant stenosis.  Echocardiogram pending.  A1c 5.4, LDL 55.  Patient on a statin at home.    Recommendations: 1.  ASA 81mg  and Plavix 75mg  daily. 2.  Patient to remain on statin 3.  If echocardiogram is unremarkable patient may follow up with neurology on an outpatient basis.    LOS: 0 days   Alexis Goodell, MD Neurology [phone] 08/20/2018  10:29 AM

## 2018-08-20 NOTE — Progress Notes (Signed)
Oral and written AVS instructions given with rx for plavix. ECHO completed. Ready for discharge home. Awaiting family to pick her up to take her home.

## 2018-08-20 NOTE — Progress Notes (Signed)
OT Screen  Patient Details Name: Brittney [name] MRN: [id number] DOB: 26-Nov-1929   Cancelled Treatment:    Reason Eval/Treat Not Completed: OT screened, no needs identified, will sign off  Pt report she is back to baseline - walked in hallway earlier with PT - no LOB and denies any vision changes, numbness and no pain.  No weakness or coordination issues in bilateral UE. Pt lives at [facility] and takes care of husband . Bathrooms are modified and handicapped -and she cooks.   Rosalyn Gess OTR/L,CLT 08/20/2018, 12:51 PM

## 2018-08-20 NOTE — Evaluation (Signed)
Physical Therapy Evaluation Patient Details Name: Brittney [name] MRN: [id number] DOB: [birth year] Today's Date: 08/20/2018   History of Present Illness  84 y/o female here with TIA work up, had ~45 minutes of word finding difficulty with apparent return to baseline, MRI negative.  Clinical Impression  Pt did well with mobility and ambulation, she showed good balance, safety and confidence and generally is back to her baseline and does foresee any issues with going home.  Overall pt did very well and does not show any issues that would make it difficult for her to get home safely.      Follow Up Recommendations No PT follow up    Equipment Recommendations  None recommended by PT    Recommendations for Other Services       Precautions / Restrictions Precautions Precautions: None Restrictions Weight Bearing Restrictions: No      Mobility  Bed Mobility Overal bed mobility: Independent                Transfers Overall transfer level: Independent Equipment used: None             General transfer comment: Pt was able to rise w/o hesitation, no safety issues  Ambulation/Gait Ambulation/Gait assistance: Independent Gait Distance (Feet): 250 Feet Assistive device: None       General Gait Details: PT walked with consistent speed and cadence, no fatigue or safety issues.    Stairs            Wheelchair Mobility    Modified Rankin (Stroke Patients Only)       Balance Overall balance assessment: Independent                                           Pertinent Vitals/Pain Pain Assessment: No/denies pain    Home Living Family/patient expects to be discharged to:: Private residence Living Arrangements: Spouse/significant other   Type of Home: Independent living facility([facility]) Home Access: Stairs to enter Entrance Stairs-Rails: Right Entrance Stairs-Number of Steps: 2(from garage)          Prior Function Level of  Independence: Independent         Comments: Pt able to be active, independent without AD, no h/o falls.     Hand Dominance        Extremity/Trunk Assessment   Upper Extremity Assessment Upper Extremity Assessment: Overall WFL for tasks assessed    Lower Extremity Assessment Lower Extremity Assessment: Overall WFL for tasks assessed       Communication   Communication: No difficulties  Cognition Arousal/Alertness: Awake/alert Behavior During Therapy: WFL for tasks assessed/performed Overall Cognitive Status: Within Functional Limits for tasks assessed                                        General Comments      Exercises     Assessment/Plan    PT Assessment Patent does not need any further PT services  PT Problem List         PT Treatment Interventions      PT Goals (Current goals can be found in the Care Plan section)  Acute Rehab PT Goals Patient Stated Goal: go home PT Goal Formulation: All assessment and education complete, DC therapy    Frequency  Barriers to discharge        Co-evaluation               AM-PAC PT "6 Clicks" Mobility  Outcome Measure Help needed turning from your back to your side while in a flat bed without using bedrails?: None Help needed moving from lying on your back to sitting on the side of a flat bed without using bedrails?: None Help needed moving to and from a bed to a chair (including a wheelchair)?: None Help needed standing up from a chair using your arms (e.g., wheelchair or bedside chair)?: None Help needed to walk in hospital room?: None Help needed climbing 3-5 steps with a railing? : None 6 Click Score: 24    End of Session Equipment Utilized During Treatment: Gait belt Activity Tolerance: Patient tolerated treatment well Patient left: in chair;with call bell/phone within reach Nurse Communication: Mobility status PT Visit Diagnosis: Muscle weakness (generalized)  (M62.81);Difficulty in walking, not elsewhere classified (R26.2)    Time: 8677-3736 PT Time Calculation (min) (ACUTE ONLY): 15 min   Charges:   PT Evaluation $PT Eval Low Complexity: 1 Low          Kreg Shropshire, DPT 08/20/2018, 12:55 PM

## 2018-08-20 NOTE — Discharge Summary (Signed)
[facility] [place] NAME: Brittney [name]    MR#:  [id number]  DATE OF BIRTH:  [birth year]  DATE OF ADMISSION:  08/19/2018   ADMITTING PHYSICIAN: Saundra Shelling, MD  DATE OF DISCHARGE: 08/20/2018  2:28 PM  PRIMARY CARE PHYSICIAN: Derinda Late, MD   ADMISSION DIAGNOSIS:  TIA (transient ischemic attack) [G45.9] DISCHARGE DIAGNOSIS:  Active Problems:   TIA (transient ischemic attack)  SECONDARY DIAGNOSIS:   Past Medical History:  Diagnosis Date  . Hypertension   . Renal disorder    HOSPITAL COURSE:   Brittney [name] is an 84 year old female who presented to the ED with difficulty speaking.  CT head was negative.  MRI was negative.  Carotid ultrasound without significant stenosis.  Echo was pending at the time of discharge. She was seen by neurology who recommended aspirin and Plavix for presumed TIA.  She was evaluated by physical therapy, who did not feel that she needed any additional PT.  Symptoms completely resolved at the time of discharge.  DISCHARGE CONDITIONS:  Hypertension TIA CONSULTS OBTAINED:  Treatment Team:  Catarina Hartshorn, MD DRUG ALLERGIES:   Allergies  Allergen Reactions  . Azithromycin   . Baclofen   . Iodine     CKD  . Lisinopril   . Sulfa Antibiotics    DISCHARGE MEDICATIONS:   Allergies as of 08/20/2018      Reactions   Azithromycin    Baclofen    Iodine    CKD   Lisinopril    Sulfa Antibiotics       Medication List    TAKE these medications   aspirin EC 81 MG tablet Take 81 mg by mouth daily.   atenolol 25 MG tablet Commonly known as:  TENORMIN Take 25 mg by mouth every evening.   clopidogrel 75 MG tablet Commonly known as:  PLAVIX Take 1 tablet (75 mg total) by mouth daily.   cyanocobalamin 1000 MCG/ML injection Commonly known as:  (VITAMIN B-12) Inject 1,000 mcg into the muscle every 30 (thirty) days.   fluticasone 50 MCG/ACT nasal spray Commonly known as:  FLONASE Place  2 sprays into the nose daily.   furosemide 20 MG tablet Commonly known as:  LASIX Take 20 mg by mouth daily.   loratadine 10 MG tablet Commonly known as:  CLARITIN Take 10 mg by mouth daily.   Olopatadine HCl 0.7 % Soln Apply 1 drop to eye daily.   ranitidine 150 MG tablet Commonly known as:  ZANTAC Take 150 mg by mouth 2 (two) times daily.   rosuvastatin 20 MG tablet Commonly known as:  CRESTOR Take 20 mg by mouth every evening.   Vitamin D3 25 MCG (1000 UT) Caps Take 1,000 Units by mouth daily.        DISCHARGE INSTRUCTIONS:  1.  Follow-up with PCP in 5 days 2.  Follow-up with neurology in 1 to 2 weeks 3.  Neuro recommended that patient take aspirin and Plavix at home 4.  Echo pending at the time of discharge DIET:  Cardiac diet DISCHARGE CONDITION:  Stable ACTIVITY:  Activity as tolerated OXYGEN:  Home Oxygen: No.  Oxygen Delivery: room air DISCHARGE LOCATION:  home   If you experience worsening of your admission symptoms, develop shortness of breath, life threatening emergency, suicidal or homicidal thoughts you must seek medical attention immediately by calling 911 or calling your MD immediately  if symptoms less severe.  You Must read complete instructions/literature along with all the possible adverse  reactions/side effects for all the Medicines you take and that have been prescribed to you. Take any new Medicines after you have completely understood and accpet all the possible adverse reactions/side effects.   Please note  You were cared for by a hospitalist during your hospital stay. If you have any questions about your discharge medications or the care you received while you were in the hospital after you are discharged, you can call the unit and asked to speak with the hospitalist on call if the hospitalist that took care of you is not available. Once you are discharged, your primary care physician will handle any further medical issues. Please note that  NO REFILLS for any discharge medications will be authorized once you are discharged, as it is imperative that you return to your primary care physician (or establish a relationship with a primary care physician if you do not have one) for your aftercare needs so that they can reassess your need for medications and monitor your lab values.    On the day of Discharge:  VITAL SIGNS:  Blood pressure (!) 141/73, pulse (!) 59, temperature (!) 97.5 F (36.4 C), temperature source Oral, resp. rate 20, height 5' (1.524 m), weight 65.8 kg, SpO2 98 %. PHYSICAL EXAMINATION:  GENERAL:  84 y.o.-year-old patient lying in the bed with no acute distress.  EYES: Pupils equal, round, reactive to light and accommodation. No scleral icterus. Extraocular muscles intact.  HEENT: Head atraumatic, normocephalic. Oropharynx and nasopharynx clear.  NECK:  Supple, no jugular venous distention. No thyroid enlargement, no tenderness.  LUNGS: Normal breath sounds bilaterally, no wheezing, rales,rhonchi or crepitation. No use of accessory muscles of respiration.  CARDIOVASCULAR: S1, S2 normal. No murmurs, rubs, or gallops.  ABDOMEN: Soft, non-tender, non-distended. Bowel sounds present. No organomegaly or mass.  EXTREMITIES: No pedal edema, cyanosis, or clubbing.  NEUROLOGIC: Cranial nerves II through XII are intact. Muscle strength 5/5 in all extremities. Sensation intact. Gait not checked.  PSYCHIATRIC: The patient is alert and oriented x 3.  SKIN: No obvious rash, lesion, or ulcer.  DATA REVIEW:   CBC Recent Labs  Lab 08/19/18 1057  WBC 6.4  HGB 12.2  HCT 38.2  PLT 295    Chemistries  Recent Labs  Lab 08/19/18 1057  NA 140  K 4.3  CL 109  CO2 24  GLUCOSE 103*  BUN 32*  CREATININE 1.28*  CALCIUM 9.3  AST 19  ALT 10  ALKPHOS 50  BILITOT 0.8     Microbiology Results  No results found for this or any previous visit.  RADIOLOGY:  Mr Brain Wo Contrast  Result Date: 08/19/2018 CLINICAL DATA:   Initial evaluation for acute speech difficulty. EXAM: MRI HEAD WITHOUT CONTRAST MRA HEAD WITHOUT CONTRAST TECHNIQUE: Multiplanar, multiecho pulse sequences of the brain and surrounding structures were obtained without intravenous contrast. Angiographic images of the head were obtained using MRA technique without contrast. COMPARISON:  Prior CT from earlier the same day. FINDINGS: MRI HEAD FINDINGS Brain: Age-appropriate cerebral atrophy. Patchy and confluent T2/FLAIR hyperintensity within the periventricular deep white matter both cerebral hemispheres most consistent with chronic microvascular ischemic disease, mild for age. No abnormal foci of restricted diffusion to suggest acute or subacute ischemia. Gray-white matter differentiation maintained. No encephalomalacia to suggest chronic cortical infarction. No evidence for acute or chronic intracranial hemorrhage. No mass lesion, midline shift or mass effect. No hydrocephalus. No extra-axial fluid collection. Pituitary gland normal. Vascular: Major intravascular flow voids maintained. Skull and upper cervical spine: Craniocervical  junction normal. Upper cervical spine within normal limits. Bone marrow signal intensity normal. No scalp soft tissue abnormality. Sinuses/Orbits: Patient status post bilateral ocular lens replacement. Paranasal sinuses are clear. No mastoid effusion. Inner ear structures normal. Other: None. MRA HEAD FINDINGS ANTERIOR CIRCULATION: Distal cervical segments of the internal carotid arteries are patent with antegrade flow. Petrous, cavernous, and supraclinoid segments patent without hemodynamically significant stenosis. A1 segments widely patent bilaterally. Patent and normal anterior communicating artery. Anterior cerebral arteries patent to their distal aspects without flow-limiting stenosis. M1 segments patent bilaterally. Normal MCA bifurcations. No proximal M2 occlusion. Distal MCA branches well perfused and symmetric. POSTERIOR  CIRCULATION: Vertebral arteries patent to the vertebrobasilar junction without stenosis. Posterior inferior cerebral arteries patent bilaterally. Basilar widely patent to its distal aspect. Superior cerebral arteries patent bilaterally. Both of the posterior cerebral arteries primarily supplied via the basilar and are well perfused to their distal aspects. IMPRESSION: MRI HEAD IMPRESSION: 1. No acute intracranial infarct or other abnormality. 2. Mild chronic microvascular ischemic disease for age. MRA HEAD IMPRESSION: Negative intracranial MRA. No large vessel occlusion. No hemodynamically significant or correctable stenosis. Electronically Signed   By: Jeannine Boga M.D.   On: 08/19/2018 18:11   US Carotid Bilateral (at [facility] Only)  Result Date: 08/19/2018 CLINICAL DATA:  Cerebrovascular accident. EXAM: BILATERAL CAROTID DUPLEX ULTRASOUND TECHNIQUE: Pearline Cables scale imaging, color Doppler and duplex ultrasound were performed of bilateral carotid and vertebral arteries in the neck. COMPARISON:  None. FINDINGS: Criteria: Quantification of carotid stenosis is based on velocity parameters that correlate the residual internal carotid diameter with NASCET-based stenosis levels, using the diameter of the distal internal carotid lumen as the denominator for stenosis measurement. The following velocity measurements were obtained: RIGHT ICA: 75/15 cm/sec CCA: 16/10 cm/sec SYSTOLIC ICA/CCA RATIO:  1.0 ECA: 69 cm/sec LEFT ICA: 137/28 cm/sec CCA: 96/04 cm/sec SYSTOLIC ICA/CCA RATIO:  1.9 ECA: 69 cm/sec RIGHT CAROTID ARTERY: Minimal plaque formation is noted in the right carotid bulb. RIGHT VERTEBRAL ARTERY:  Antegrade flow is noted. LEFT CAROTID ARTERY: Minimal plaque formation is noted in the proximal left internal carotid artery. LEFT VERTEBRAL ARTERY:  Antegrade flow is noted. IMPRESSION: No hemodynamically significant stenosis is noted in either cervical carotid artery. Electronically Signed   By: Marijo Conception, M.D.   On: 08/19/2018 20:51   Mr Jodene Nam Head/brain VW Cm  Result Date: 08/19/2018 CLINICAL DATA:  Initial evaluation for acute speech difficulty. EXAM: MRI HEAD WITHOUT CONTRAST MRA HEAD WITHOUT CONTRAST TECHNIQUE: Multiplanar, multiecho pulse sequences of the brain and surrounding structures were obtained without intravenous contrast. Angiographic images of the head were obtained using MRA technique without contrast. COMPARISON:  Prior CT from earlier the same day. FINDINGS: MRI HEAD FINDINGS Brain: Age-appropriate cerebral atrophy. Patchy and confluent T2/FLAIR hyperintensity within the periventricular deep white matter both cerebral hemispheres most consistent with chronic microvascular ischemic disease, mild for age. No abnormal foci of restricted diffusion to suggest acute or subacute ischemia. Gray-white matter differentiation maintained. No encephalomalacia to suggest chronic cortical infarction. No evidence for acute or chronic intracranial hemorrhage. No mass lesion, midline shift or mass effect. No hydrocephalus. No extra-axial fluid collection. Pituitary gland normal. Vascular: Major intravascular flow voids maintained. Skull and upper cervical spine: Craniocervical junction normal. Upper cervical spine within normal limits. Bone marrow signal intensity normal. No scalp soft tissue abnormality. Sinuses/Orbits: Patient status post bilateral ocular lens replacement. Paranasal sinuses are clear. No mastoid effusion. Inner ear structures normal. Other: None. MRA HEAD FINDINGS ANTERIOR CIRCULATION:  Distal cervical segments of the internal carotid arteries are patent with antegrade flow. Petrous, cavernous, and supraclinoid segments patent without hemodynamically significant stenosis. A1 segments widely patent bilaterally. Patent and normal anterior communicating artery. Anterior cerebral arteries patent to their distal aspects without flow-limiting stenosis. M1 segments patent bilaterally. Normal MCA  bifurcations. No proximal M2 occlusion. Distal MCA branches well perfused and symmetric. POSTERIOR CIRCULATION: Vertebral arteries patent to the vertebrobasilar junction without stenosis. Posterior inferior cerebral arteries patent bilaterally. Basilar widely patent to its distal aspect. Superior cerebral arteries patent bilaterally. Both of the posterior cerebral arteries primarily supplied via the basilar and are well perfused to their distal aspects. IMPRESSION: MRI HEAD IMPRESSION: 1. No acute intracranial infarct or other abnormality. 2. Mild chronic microvascular ischemic disease for age. MRA HEAD IMPRESSION: Negative intracranial MRA. No large vessel occlusion. No hemodynamically significant or correctable stenosis. Electronically Signed   By: Jeannine Boga M.D.   On: 08/19/2018 18:11     Management plans discussed with the patient, family and they are in agreement.  CODE STATUS: DNR   TOTAL TIME TAKING CARE OF THIS PATIENT: 40 minutes.    Berna Spare  M.D on 08/20/2018 at 5:28 PM  Between 7am to 6pm - Pager - [phone]  After 6pm go to [email or web address] - Proofreader  [facility]  Office  [phone]  CC: Primary care physician; Derinda Late, MD   Note: This dictation was prepared with Dragon dictation along with smaller phrase technology. Any transcriptional errors that result from this process are unintentional.

## 2018-08-20 NOTE — Progress Notes (Signed)
Discharge home to care of family; released to husband and son. Transported in transport chair to private vehicle. PT home referral has been sent.

## 2018-08-20 NOTE — Discharge Instructions (Signed)
It was so nice to meet you during this hospitalization!  You came in the hospital because you were having difficulty speaking. Your MRI did not show a stroke. You were seen by the neurologist, who recommended that you take aspirin 81mg  and plavix 75mg  every day.  Please call the neurology office to schedule a new patient appointment.  Take care, Dr. Brett Albino

## 2018-08-20 NOTE — Care Management Obs Status (Signed)
[facility] NOTIFICATION   Patient Details  Name: Prudy Candy MRN: [id number] Date of Birth: 1930/05/31   Medicare Observation Status Notification Given:  Yes    Emilene Roma A Shametra Cumberland, RN 08/20/2018, 10:34 AM

## 2018-10-31 DIAGNOSIS — I679 Cerebrovascular disease, unspecified: Secondary | ICD-10-CM | POA: Insufficient documentation

## 2019-07-14 ENCOUNTER — Emergency Department: Payer: Medicare Other

## 2019-07-14 ENCOUNTER — Inpatient Hospital Stay
Admission: EM | Admit: 2019-07-14 | Discharge: 2019-07-16 | DRG: 438 | Disposition: A | Discharge status: Home / Self Care | Payer: Medicare Other | Source: Ambulatory Visit | Attending: Internal Medicine | Admitting: Internal Medicine

## 2019-07-14 ENCOUNTER — Other Ambulatory Visit: Payer: Self-pay

## 2019-07-14 ENCOUNTER — Encounter: Payer: Self-pay | Admitting: Emergency Medicine

## 2019-07-14 DIAGNOSIS — Z79899 Other long term (current) drug therapy: Secondary | ICD-10-CM

## 2019-07-14 DIAGNOSIS — G459 Transient cerebral ischemic attack, unspecified: Secondary | ICD-10-CM | POA: Diagnosis present

## 2019-07-14 DIAGNOSIS — Z7982 Long term (current) use of aspirin: Secondary | ICD-10-CM | POA: Diagnosis not present

## 2019-07-14 DIAGNOSIS — Z20828 Contact with and (suspected) exposure to other viral communicable diseases: Secondary | ICD-10-CM | POA: Diagnosis present

## 2019-07-14 DIAGNOSIS — Z882 Allergy status to sulfonamides status: Secondary | ICD-10-CM | POA: Diagnosis not present

## 2019-07-14 DIAGNOSIS — Z7902 Long term (current) use of antithrombotics/antiplatelets: Secondary | ICD-10-CM | POA: Diagnosis not present

## 2019-07-14 DIAGNOSIS — R109 Unspecified abdominal pain: Secondary | ICD-10-CM | POA: Diagnosis not present

## 2019-07-14 DIAGNOSIS — Z881 Allergy status to other antibiotic agents status: Secondary | ICD-10-CM

## 2019-07-14 DIAGNOSIS — K859 Acute pancreatitis without necrosis or infection, unspecified: Secondary | ICD-10-CM | POA: Diagnosis not present

## 2019-07-14 DIAGNOSIS — I5023 Acute on chronic systolic (congestive) heart failure: Secondary | ICD-10-CM | POA: Diagnosis present

## 2019-07-14 DIAGNOSIS — I2510 Atherosclerotic heart disease of native coronary artery without angina pectoris: Secondary | ICD-10-CM | POA: Diagnosis present

## 2019-07-14 DIAGNOSIS — N183 Chronic kidney disease, stage 3 unspecified: Secondary | ICD-10-CM | POA: Diagnosis present

## 2019-07-14 DIAGNOSIS — Z66 Do not resuscitate: Secondary | ICD-10-CM | POA: Diagnosis present

## 2019-07-14 DIAGNOSIS — I130 Hypertensive heart and chronic kidney disease with heart failure and stage 1 through stage 4 chronic kidney disease, or unspecified chronic kidney disease: Secondary | ICD-10-CM | POA: Diagnosis present

## 2019-07-14 DIAGNOSIS — Z8673 Personal history of transient ischemic attack (TIA), and cerebral infarction without residual deficits: Secondary | ICD-10-CM

## 2019-07-14 DIAGNOSIS — N1831 Chronic kidney disease, stage 3a: Secondary | ICD-10-CM | POA: Diagnosis not present

## 2019-07-14 DIAGNOSIS — M109 Gout, unspecified: Secondary | ICD-10-CM | POA: Diagnosis present

## 2019-07-14 DIAGNOSIS — Z888 Allergy status to other drugs, medicaments and biological substances status: Secondary | ICD-10-CM

## 2019-07-14 DIAGNOSIS — K8500 Idiopathic acute pancreatitis without necrosis or infection: Principal | ICD-10-CM | POA: Diagnosis present

## 2019-07-14 DIAGNOSIS — I5021 Acute systolic (congestive) heart failure: Secondary | ICD-10-CM | POA: Clinically undetermined

## 2019-07-14 DIAGNOSIS — I10 Essential (primary) hypertension: Secondary | ICD-10-CM | POA: Diagnosis present

## 2019-07-14 DIAGNOSIS — Z90710 Acquired absence of both cervix and uterus: Secondary | ICD-10-CM | POA: Diagnosis not present

## 2019-07-14 DIAGNOSIS — K853 Drug induced acute pancreatitis without necrosis or infection: Secondary | ICD-10-CM | POA: Diagnosis not present

## 2019-07-14 HISTORY — DX: Transient cerebral ischemic attack, unspecified: G45.9

## 2019-07-14 LAB — LIPASE, BLOOD: Lipase: 342 U/L — ABNORMAL HIGH (ref 11–51)

## 2019-07-14 LAB — COMPREHENSIVE METABOLIC PANEL
ALT: 8 U/L (ref 0–44)
AST: 16 U/L (ref 15–41)
Albumin: 3.8 g/dL (ref 3.5–5.0)
Alkaline Phosphatase: 57 U/L (ref 38–126)
Anion gap: 10 (ref 5–15)
BUN: 34 mg/dL — ABNORMAL HIGH (ref 8–23)
CO2: 24 mmol/L (ref 22–32)
Calcium: 9.1 mg/dL (ref 8.9–10.3)
Chloride: 107 mmol/L (ref 98–111)
Creatinine, Ser: 1.41 mg/dL — ABNORMAL HIGH (ref 0.44–1.00)
GFR calc Af Amer: 39 mL/min — ABNORMAL LOW (ref >60)
GFR calc non Af Amer: 33 mL/min — ABNORMAL LOW (ref >60)
Glucose, Bld: 126 mg/dL — ABNORMAL HIGH (ref 70–99)
Potassium: 4.1 mmol/L (ref 3.5–5.1)
Sodium: 141 mmol/L (ref 135–145)
Total Bilirubin: 1.7 mg/dL — ABNORMAL HIGH (ref 0.3–1.2)
Total Protein: 6.7 g/dL (ref 6.5–8.1)

## 2019-07-14 LAB — URINALYSIS, COMPLETE (UACMP) WITH MICROSCOPIC
Bacteria, UA: NONE SEEN
Bilirubin Urine: NEGATIVE
Glucose, UA: NEGATIVE mg/dL
Hgb urine dipstick: NEGATIVE
Ketones, ur: NEGATIVE mg/dL
Nitrite: NEGATIVE
Protein, ur: 30 mg/dL — AB
Specific Gravity, Urine: 1.027 (ref 1.005–1.030)
pH: 5 (ref 5.0–8.0)

## 2019-07-14 LAB — CBC
HCT: 37.1 % (ref 36.0–46.0)
Hemoglobin: 12.5 g/dL (ref 12.0–15.0)
MCH: 31.3 pg (ref 26.0–34.0)
MCHC: 33.7 g/dL (ref 30.0–36.0)
MCV: 93 fL (ref 80.0–100.0)
Platelets: 325 10*3/uL (ref 150–400)
RBC: 3.99 MIL/uL (ref 3.87–5.11)
RDW: 12.9 % (ref 11.5–15.5)
WBC: 14.4 10*3/uL — ABNORMAL HIGH (ref 4.0–10.5)
nRBC: 0 % (ref 0.0–0.2)

## 2019-07-14 MED ORDER — SODIUM CHLORIDE 0.9% FLUSH: 3.0000 mL | Freq: Once | INTRAVENOUS | Status: DC

## 2019-07-14 MED ORDER — MORPHINE SULFATE (PF) 2 MG/ML IV SOLN: 2.0000 mg | Freq: Once | INTRAVENOUS | Status: DC

## 2019-07-14 MED ORDER — ONDANSETRON HCL 4 MG/2ML IJ SOLN: 4.0000 mg | Freq: Once | INTRAMUSCULAR | Status: DC

## 2019-07-14 MED ORDER — SODIUM CHLORIDE 0.9 % IV SOLN
Freq: Once | INTRAVENOUS | Status: AC
  Administered 2019-07-14: 21:00:00 via INTRAVENOUS

## 2019-07-14 NOTE — ED Notes (Signed)
Pt up to restroom. No complaints at this time, just wanted lights off

## 2019-07-14 NOTE — ED Notes (Signed)
Patient resting quietly with eyes closed in no acute distress.  

## 2019-07-14 NOTE — H&P (Signed)
History and Physical    Brittney [name] [id number] DOB: June 17, 1930 DOA: 07/14/2019  PCP: Derinda Late, MD  Patient coming from: home   Chief Complaint: abdominal pain  HPI: Brittney [name] is a 85 y.o. female with medical history significant for tia, htn, cad (non-obstructive), ckd 3, gout, who presents with above.  Symptoms developed relatively rapidly beginning yesterday. First time experiencing this problem. Epigastric abdominal pain radiating to the back. Moderate in intensity, worse with movement, improved with lying still. Associated w/ nausea and decreased appetite. No vomiting. Normal bowel movements, had one earlier today. No fevers. No cough or sob. No chest pain. No blood in stool. Denies history of alcohol or drug use. Denies history gallstones or pancreatitis. Endorses one episode of "colitis" several years ago, not currently followed by GI. No recent med changes. No known covid contacts.  ED Course: labs, fluids.  Review of Systems: As per HPI otherwise 10 point review of systems negative.    Past Medical History:  Diagnosis Date   Hypertension    Renal disorder    TIA (transient ischemic attack)     Past Surgical History:  Procedure Laterality Date   ABDOMINAL HYSTERECTOMY       reports that she has never smoked. She has never used smokeless tobacco. She reports previous alcohol use. She reports that she does not use drugs.  Allergies  Allergen Reactions   Azithromycin    Baclofen    Iodine     CKD   Lisinopril    Sulfa Antibiotics     No family history on file.  Prior to Admission medications   Medication Sig Start Date End Date Taking? Authorizing Provider  allopurinol (ZYLOPRIM) 100 MG tablet Take 100 mg by mouth daily.   Yes [provider]  clopidogrel (PLAVIX) 75 MG tablet Take 75 mg by mouth daily.   Yes [provider]  aspirin EC 81 MG tablet Take 81 mg by mouth daily.     [provider]  atenolol  (TENORMIN) 25 MG tablet Take 25 mg by mouth every evening. 03/04/18   [provider]  Cholecalciferol (VITAMIN D3) 25 MCG (1000 UT) CAPS Take 1,000 Units by mouth daily.    [provider]  cyanocobalamin (,VITAMIN B-12,) 1000 MCG/ML injection Inject 1,000 mcg into the muscle every 30 (thirty) days.    [provider]  fluticasone (FLONASE) 50 MCG/ACT nasal spray Place 2 sprays into the nose daily.    [provider]  furosemide (LASIX) 20 MG tablet Take 20 mg by mouth daily. 03/04/18   [provider]  loratadine (CLARITIN) 10 MG tablet Take 10 mg by mouth daily.    [provider]  Olopatadine HCl 0.7 % SOLN Apply 1 drop to eye daily.    [provider]  ranitidine (ZANTAC) 150 MG tablet Take 150 mg by mouth 2 (two) times daily.    [provider]  rosuvastatin (CRESTOR) 20 MG tablet Take 20 mg by mouth every evening.  03/04/18   [provider]    Physical Exam: Vitals:   07/14/19 1516 07/14/19 1517  BP: (!) 142/65   Pulse: 76   Resp: 16   Temp: 99.1 F (37.3 C)   TempSrc: Oral   SpO2: 99%   Weight:  70.8 kg  Height:  4\' 11"  (1.499 m)    Constitutional: No acute distress Head: Atraumatic Eyes: Conjunctiva clear ENM: Moist mucous membranes. Normal dentition.  Neck: Supple Respiratory: Clear to auscultation bilaterally, no  wheezing/rales/rhonchi. Normal respiratory effort. No accessory muscle use. . Cardiovascular: Regular rate and rhythm. No murmurs/rubs/gallops. Abdomen: soft, ttp upper quadrants greatest epigastrum, no rebound, equivocal guarding, normoactive bowel sounds Musculoskeletal: No joint deformity upper and lower extremities. Normal ROM, no contractures. Normal muscle tone.  Skin: No rashes, lesions, or ulcers.  Extremities: trace LE peripheral edema. Palpable peripheral pulses. Neurologic: Alert, moving all 4 extremities. Psychiatric: Normal insight and judgement.   Labs on Admission:  I have personally reviewed following labs and imaging studies  CBC: Recent Labs  Lab 07/14/19 1520  WBC 14.4*  HGB 12.5  HCT 37.1  MCV 93.0  PLT [id number]   Basic Metabolic Panel: Recent Labs  Lab 07/14/19 1520  NA 141  K 4.1  CL 107  CO2 24  GLUCOSE 126*  BUN 34*  CREATININE 1.41*  CALCIUM 9.1   GFR: Estimated Creatinine Clearance: 24.1 mL/min (A) (by C-G formula based on SCr of 1.41 mg/dL (H)). Liver Function Tests: Recent Labs  Lab 07/14/19 1520  AST 16  ALT 8  ALKPHOS 57  BILITOT 1.7*  PROT 6.7  ALBUMIN 3.8   Recent Labs  Lab 07/14/19 1520  LIPASE 342*   No results for input(s): AMMONIA in the last 168 hours. Coagulation Profile: No results for input(s): INR, PROTIME in the last 168 hours. Cardiac Enzymes: No results for input(s): CKTOTAL, CKMB, CKMBINDEX, TROPONINI in the last 168 hours. BNP (last 3 results) No results for input(s): PROBNP in the last 8760 hours. HbA1C: No results for input(s): HGBA1C in the last 72 hours. CBG: No results for input(s): GLUCAP in the last 168 hours. Lipid Profile: No results for input(s): CHOL, HDL, LDLCALC, TRIG, CHOLHDL, LDLDIRECT in the last 72 hours. Thyroid Function Tests: No results for input(s): TSH, T4TOTAL, FREET4, T3FREE, THYROIDAB in the last 72 hours. Anemia Panel: No results for input(s): VITAMINB12, FOLATE, FERRITIN, TIBC, IRON, RETICCTPCT in the last 72 hours. Urine analysis:    Component Value Date/Time   COLORURINE YELLOW (A) 07/14/2019 1520   APPEARANCEUR HAZY (A) 07/14/2019 1520   LABSPEC 1.027 07/14/2019 1520   PHURINE 5.0 07/14/2019 1520   GLUCOSEU NEGATIVE 07/14/2019 1520   HGBUR NEGATIVE 07/14/2019 [place] 07/14/2019 [place] 07/14/2019 1520   PROTEINUR 30 (A) 07/14/2019 1520   NITRITE NEGATIVE 07/14/2019 1520   LEUKOCYTESUR LARGE (A) 07/14/2019 1520    Radiological Exams on Admission: Ct Abdomen Pelvis Wo Contrast  Result Date:  07/14/2019 CLINICAL DATA:  Pt to ED via POV c/o "infection in my gut". Pt states that she has seen by her PCP who told her that her white blood count was elevated. Pt states that she is having pain in her abdomen and her back. EXAM: CT ABDOMEN AND PELVIS WITHOUT CONTRAST TECHNIQUE: Multidetector CT imaging of the abdomen and pelvis was performed following the standard protocol without IV contrast. COMPARISON:  Current right upper quadrant ultrasound. FINDINGS: Lower chest: Lung base opacities consistent with atelectasis and/or scarring. No acute findings. Hepatobiliary: 11 mm low-attenuation lesion at the dome of segment 7, consistent with a cyst. No other liver masses or lesions. Liver normal in size and overall attenuation. Normal gallbladder. No bile duct dilation. Pancreas: There are inflammatory changes adjacent to the pancreatic tail, which extend along the left anterior pararenal fascia. Remainder of the pancreas is unremarkable. No pancreatic masses. Spleen: Normal in size without focal abnormality. Adrenals/Urinary Tract: No adrenal masses. Bilateral low-attenuation renal masses. Largest arises from the posterior upper pole of  the left kidney, 6.8 cm in long axis. These are all consistent with cysts. Bilateral renal cortical thinning. No stones. No hydronephrosis. Normal ureters. Normal bladder. Stomach/Bowel: Moderate-sized hiatal hernia. Stomach otherwise unremarkable. Small bowel and colon are normal in caliber. No wall thickening. Inflammatory changes lie adjacent to the upper descending colon, but appear to originate from the pancreatic tail. There are scattered colonic diverticula without inflammation. Normal appendix visualized. Vascular/Lymphatic: Aortic atherosclerosis. No aneurysm. No enlarged lymph nodes. Reproductive: Status post hysterectomy. No adnexal masses. Other: No ascites.  No abdominal wall hernia. Musculoskeletal: Moderate compression deformity of T10 that appears chronic. No  convincing acute fracture. Grade 1 anterolisthesis of L4 on L5 and L5 on S1. There are no bone lesions. IMPRESSION: 1. Inflammatory changes adjacent to the pancreatic tail and along the left anterior pararenal fascia extending to the left pericolic gutter. Findings are consistent with acute pancreatitis. No collection is seen to suggest an abscess or pseudocyst. 2. No other acute abnormality within the abdomen or pelvis. 3. Colonic diverticula without evidence of diverticulitis. 4. Moderate hiatal hernia. 5. Aortic atherosclerosis. 6. Renal cortical thinning and renal cysts. Electronically Signed   By: Lajean Manes M.D.   On: 07/14/2019 21:26   US Abdomen Limited Ruq  Result Date: 07/14/2019 CLINICAL DATA:  Abdominal pain, prior hysterectomy EXAM: ULTRASOUND ABDOMEN LIMITED RIGHT UPPER QUADRANT COMPARISON:  None. FINDINGS: Gallbladder: No gallstones or wall thickening visualized. No sonographic Murphy sign noted by sonographer. Common bile duct: Diameter: 2.3 mm, nondilated Liver: No focal lesion identified. Within normal limits in parenchymal echogenicity. Portal vein is patent on color Doppler imaging with normal direction of blood flow towards the liver. Other: Incidentally noted 1.8 cm right anechoic renal cyst. Right renal echogenicity is diffusely increased as well compatible with patient's history of medical renal disease/CKD III. IMPRESSION: Normal gallbladder and liver. Incidentally noted right renal cyst and diffusely increased right renal echogenicity compatible with history of medical renal disease/CKD. Electronically Signed   By: Lovena Le M.D.   On: 07/14/2019 20:05    EKG: Independently reviewed. LAD  Assessment/Plan Principal Problem:   Pancreatitis Active Problems:   TIA (transient ischemic attack)   CKD (chronic kidney disease) stage 3, GFR 30-59 ml/min   Essential hypertension   Coronary artery disease   Gout   Acute pancreatitis   # Acute pancreatitis - at this time  symptoms mild. Lipase elevated to 342, with CT findings suggestive of acute pancreatitis. No signs abscess or pseudocyst on imaging. No signs gallstone on ct or u/s, and pt denies hx of gallstones, though noted that t bili elevated to 1.7. "never drunk a drop" of alcohol. In no pain at rest, hemodynamically stable. - LR @ 125 - npo except for sips w/ important meds - repeat cmp, lipase in AM, further w/u for retained stone prn - pt declines pain meds for now, would only want if severe pain - zofran prn  # HTN - cont home atenolol and furosemide, hold statin  # ckd 3 - cr 1.41, 1.28 previoiusly - fluids as above  # gout  - hold home allouprinol  # history TIA - hold home aspirin/plavix, question need for dapt  DVT prophylaxis: lovenox Code Status: dnr, confirmed w/ patient  Family Communication: husband fred  Disposition Plan: tbd  Consults called: none  Admission status: med/surg    Desma Maxim MD [facility] Pager [phone]  If 7PM-7AM, please contact night-coverage [email or web address] Password [email or web address]  07/14/2019, 11:03 PM

## 2019-07-14 NOTE — ED Provider Notes (Signed)
[facility] Emergency Department Provider Note  ____________________________________________   First MD Initiated Contact with Patient 07/14/19 2035     (approximate)  I have reviewed the triage vital signs and the nursing notes.   HISTORY  Chief Complaint Abdominal Pain    HPI Brittney [name] is a 85 y.o. female  Here with abdominal pain. Pt reports that over the last 2-3 days, she has had persistently worsening aching, gnawing, epigastric abd pain. Describes it as primarily epigastric but occasionally radiating to her back. She has had nausea but no vomting. No h/o similar pain. Denies any preceding triggers. Does not drink alcohol and denies any dietary changes or high fat meals. No h/o pancreatitis. No recent med changes. She went to her PCP today who sent her here after labs showed elevated WBC. No known fevers. No urinary sx.        Past Medical History:  Diagnosis Date   Hypertension    Renal disorder    TIA (transient ischemic attack)     Patient Active Problem List   Diagnosis Date Noted   TIA (transient ischemic attack) 08/19/2018    Past Surgical History:  Procedure Laterality Date   ABDOMINAL HYSTERECTOMY      Prior to Admission medications   Medication Sig Start Date End Date Taking? Authorizing Provider  aspirin EC 81 MG tablet Take 81 mg by mouth daily.     [provider]  atenolol (TENORMIN) 25 MG tablet Take 25 mg by mouth every evening. 03/04/18   [provider]  Cholecalciferol (VITAMIN D3) 25 MCG (1000 UT) CAPS Take 1,000 Units by mouth daily.    [provider]  cyanocobalamin (,VITAMIN B-12,) 1000 MCG/ML injection Inject 1,000 mcg into the muscle every 30 (thirty) days.    [provider]  fluticasone (FLONASE) 50 MCG/ACT nasal spray Place 2 sprays into the nose daily.    [provider]  furosemide (LASIX) 20 MG tablet Take 20 mg by mouth daily. 03/04/18   [provider]  loratadine (CLARITIN) 10 MG tablet Take 10 mg by mouth daily.    [provider]  Olopatadine HCl 0.7 % SOLN Apply 1 drop to eye daily.    [provider]  ranitidine (ZANTAC) 150 MG tablet Take 150 mg by mouth 2 (two) times daily.    [provider]  rosuvastatin (CRESTOR) 20 MG tablet Take 20 mg by mouth every evening.  03/04/18   [provider]    Allergies Azithromycin, Baclofen, Iodine, Lisinopril, and Sulfa antibiotics  No family history on file.  Social History Social History   Tobacco Use   Smoking status: Never Smoker   Smokeless tobacco: Never Used  Substance Use Topics   Alcohol use: Not Currently    Frequency: Never   Drug use: Never    Review of Systems  Review of Systems  Constitutional: Positive for fatigue. Negative for fever.  HENT: Negative for congestion and sore throat.   Eyes: Negative for visual disturbance.  Respiratory: Negative for cough and shortness of breath.   Cardiovascular: Negative for chest pain.  Gastrointestinal: Positive for abdominal pain and nausea. Negative for diarrhea and vomiting.  Genitourinary: Negative for flank pain.  Musculoskeletal: Negative for back pain and neck pain.  Skin: Negative for rash and wound.  Neurological: Positive for weakness.  All other systems reviewed and are negative.    ____________________________________________  PHYSICAL EXAM:      VITAL SIGNS: ED Triage Vitals  Enc Vitals Group     BP 07/14/19 1516 (!) 142/65     Pulse Rate 07/14/19 1516 76     Resp 07/14/19 1516 16     Temp 07/14/19 1516 99.1 F (37.3 C)     Temp Source 07/14/19 1516 Oral     SpO2 07/14/19 1516 99 %     Weight 07/14/19 1517 156 lb (70.8 kg)     Height 07/14/19 1517 4\' 11"  (1.499 m)     Head Circumference --      Peak Flow --      Pain Score 07/14/19 1516 5     Pain Loc --      Pain Edu? --      Excl. in [place]? --      Physical Exam Vitals signs and nursing note  reviewed.  Constitutional:      General: She is not in acute distress.    Appearance: She is well-developed.  HENT:     Head: Normocephalic and atraumatic.  Eyes:     Conjunctiva/sclera: Conjunctivae normal.  Neck:     Musculoskeletal: Neck supple.  Cardiovascular:     Rate and Rhythm: Normal rate and regular rhythm.     Heart sounds: Normal heart sounds. No murmur. No friction rub.  Pulmonary:     Effort: Pulmonary effort is normal. No respiratory distress.     Breath sounds: Normal breath sounds. No wheezing or rales.  Abdominal:     General: There is no distension.     Palpations: Abdomen is soft.     Tenderness: There is abdominal tenderness in the epigastric area and periumbilical area. There is no guarding or rebound.  Skin:    General: Skin is warm.     Capillary Refill: Capillary refill takes less than 2 seconds.  Neurological:     Mental Status: She is alert and oriented to person, place, and time.     Motor: No abnormal muscle tone.       ____________________________________________   LABS (all labs ordered are listed, but only abnormal results are displayed)  Labs Reviewed  LIPASE, BLOOD - Abnormal; Notable for the following components:      Result Value   Lipase 342 (*)    All other components within normal limits  COMPREHENSIVE METABOLIC PANEL - Abnormal; Notable for the following components:   Glucose, Bld 126 (*)    BUN 34 (*)    Creatinine, Ser 1.41 (*)    Total Bilirubin 1.7 (*)    GFR calc non Af Amer 33 (*)    GFR calc Af Amer 39 (*)    All other components within normal limits  CBC - Abnormal; Notable for the following components:   WBC 14.4 (*)    All other components within normal limits  URINALYSIS, COMPLETE (UACMP) WITH MICROSCOPIC - Abnormal; Notable for the following components:   Color, Urine YELLOW (*)    APPearance HAZY (*)    Protein, ur 30 (*)    Leukocytes,Ua LARGE (*)    Non Squamous Epithelial PRESENT (*)    All other  components within normal limits  SARS CORONAVIRUS 2 (TAT 6-24 HRS)    ____________________________________________  EKG: None ________________________________________  RADIOLOGY All imaging, including plain films, CT scans, and ultrasounds, independently reviewed by me, and interpretations confirmed via formal radiology reads.  ED MD interpretation:   RUQ U/S: Normal CT: Acute pancreatitis, no complications  Official radiology report(s): Ct Abdomen Pelvis Wo Contrast  Result Date: 07/14/2019  CLINICAL DATA:  Pt to ED via POV c/o "infection in my gut". Pt states that she has seen by her PCP who told her that her white blood count was elevated. Pt states that she is having pain in her abdomen and her back. EXAM: CT ABDOMEN AND PELVIS WITHOUT CONTRAST TECHNIQUE: Multidetector CT imaging of the abdomen and pelvis was performed following the standard protocol without IV contrast. COMPARISON:  Current right upper quadrant ultrasound. FINDINGS: Lower chest: Lung base opacities consistent with atelectasis and/or scarring. No acute findings. Hepatobiliary: 11 mm low-attenuation lesion at the dome of segment 7, consistent with a cyst. No other liver masses or lesions. Liver normal in size and overall attenuation. Normal gallbladder. No bile duct dilation. Pancreas: There are inflammatory changes adjacent to the pancreatic tail, which extend along the left anterior pararenal fascia. Remainder of the pancreas is unremarkable. No pancreatic masses. Spleen: Normal in size without focal abnormality. Adrenals/Urinary Tract: No adrenal masses. Bilateral low-attenuation renal masses. Largest arises from the posterior upper pole of the left kidney, 6.8 cm in long axis. These are all consistent with cysts. Bilateral renal cortical thinning. No stones. No hydronephrosis. Normal ureters. Normal bladder. Stomach/Bowel: Moderate-sized hiatal hernia. Stomach otherwise unremarkable. Small bowel and colon are normal in  caliber. No wall thickening. Inflammatory changes lie adjacent to the upper descending colon, but appear to originate from the pancreatic tail. There are scattered colonic diverticula without inflammation. Normal appendix visualized. Vascular/Lymphatic: Aortic atherosclerosis. No aneurysm. No enlarged lymph nodes. Reproductive: Status post hysterectomy. No adnexal masses. Other: No ascites.  No abdominal wall hernia. Musculoskeletal: Moderate compression deformity of T10 that appears chronic. No convincing acute fracture. Grade 1 anterolisthesis of L4 on L5 and L5 on S1. There are no bone lesions. IMPRESSION: 1. Inflammatory changes adjacent to the pancreatic tail and along the left anterior pararenal fascia extending to the left pericolic gutter. Findings are consistent with acute pancreatitis. No collection is seen to suggest an abscess or pseudocyst. 2. No other acute abnormality within the abdomen or pelvis. 3. Colonic diverticula without evidence of diverticulitis. 4. Moderate hiatal hernia. 5. Aortic atherosclerosis. 6. Renal cortical thinning and renal cysts. Electronically Signed   By: Lajean Manes M.D.   On: 07/14/2019 21:26   US Abdomen Limited Ruq  Result Date: 07/14/2019 CLINICAL DATA:  Abdominal pain, prior hysterectomy EXAM: ULTRASOUND ABDOMEN LIMITED RIGHT UPPER QUADRANT COMPARISON:  None. FINDINGS: Gallbladder: No gallstones or wall thickening visualized. No sonographic Murphy sign noted by sonographer. Common bile duct: Diameter: 2.3 mm, nondilated Liver: No focal lesion identified. Within normal limits in parenchymal echogenicity. Portal vein is patent on color Doppler imaging with normal direction of blood flow towards the liver. Other: Incidentally noted 1.8 cm right anechoic renal cyst. Right renal echogenicity is diffusely increased as well compatible with patient's history of medical renal disease/CKD III. IMPRESSION: Normal gallbladder and liver. Incidentally noted right renal cyst and  diffusely increased right renal echogenicity compatible with history of medical renal disease/CKD. Electronically Signed   By: Lovena Le M.D.   On: 07/14/2019 20:05    ____________________________________________  PROCEDURES   Procedure(s) performed (including Critical Care):  Procedures  ____________________________________________  INITIAL IMPRESSION / MDM / [place] / ED COURSE  As part of my medical decision making, I reviewed the following data within the [place] notes reviewed and incorporated, Old chart reviewed, Notes from prior ED visits, and [place] Controlled Substance Database       *Brittney [name] was  evaluated in Emergency Department on 07/14/2019 for the symptoms described in the history of present illness. She was evaluated in the context of the global COVID-19 pandemic, which necessitated consideration that the patient might be at risk for infection with the SARS-CoV-2 virus that causes COVID-19. Institutional protocols and algorithms that pertain to the evaluation of patients at risk for COVID-19 are in a state of rapid change based on information released by regulatory bodies including the CDC and federal and state organizations. These policies and algorithms were followed during the patient's care in the ED.  Some ED evaluations and interventions may be delayed as a result of limited staffing during the pandemic.*     Medical Decision Making:  85 yo F here with abdominal pain. Labs show mild likely reactive leukocytosis, and elevated lipase c/w pancreatitis. CT scan shows no complications but confirms acute pancreatitis. Unclear trigger - denies EtOH use, no gallstones. No high risk medications. She does report history of "colitis" for which she was on steroids in the past, raising question of autoimmune condition. Admit to medicine.   ____________________________________________  FINAL CLINICAL IMPRESSION(S) / ED  DIAGNOSES  Final diagnoses:  Idiopathic acute pancreatitis without infection or necrosis     MEDICATIONS GIVEN DURING THIS VISIT:  Medications  sodium chloride flush (NS) 0.9 % injection 3 mL (has no administration in time range)  ondansetron (ZOFRAN) injection 4 mg (has no administration in time range)  morphine 2 MG/ML injection 2 mg (has no administration in time range)  0.9 %  sodium chloride infusion ( Intravenous New Bag/Given 07/14/19 2104)     ED Discharge Orders    None       Note:  This document was prepared using Dragon voice recognition software and may include unintentional dictation errors.   Duffy Bruce, MD 07/14/19 2219

## 2019-07-14 NOTE — ED Triage Notes (Signed)
Pt to ED via POV c/o "infection in my gut". Pt states that she has seen by her PCP who told her that her white blood count was elevated. Pt states that she is having pain in her abdomen and her back. Pt is in NAD.

## 2019-07-15 DIAGNOSIS — K853 Drug induced acute pancreatitis without necrosis or infection: Secondary | ICD-10-CM

## 2019-07-15 DIAGNOSIS — I5021 Acute systolic (congestive) heart failure: Secondary | ICD-10-CM | POA: Clinically undetermined

## 2019-07-15 DIAGNOSIS — N1831 Chronic kidney disease, stage 3a: Secondary | ICD-10-CM

## 2019-07-15 DIAGNOSIS — I10 Essential (primary) hypertension: Secondary | ICD-10-CM

## 2019-07-15 DIAGNOSIS — I5022 Chronic systolic (congestive) heart failure: Secondary | ICD-10-CM | POA: Insufficient documentation

## 2019-07-15 LAB — CBC
HCT: 35 % — ABNORMAL LOW (ref 36.0–46.0)
Hemoglobin: 11.7 g/dL — ABNORMAL LOW (ref 12.0–15.0)
MCH: 31.1 pg (ref 26.0–34.0)
MCHC: 33.4 g/dL (ref 30.0–36.0)
MCV: 93.1 fL (ref 80.0–100.0)
Platelets: 285 10*3/uL (ref 150–400)
RBC: 3.76 MIL/uL — ABNORMAL LOW (ref 3.87–5.11)
RDW: 12.9 % (ref 11.5–15.5)
WBC: 13.6 10*3/uL — ABNORMAL HIGH (ref 4.0–10.5)
nRBC: 0 % (ref 0.0–0.2)

## 2019-07-15 LAB — BRAIN NATRIURETIC PEPTIDE: B Natriuretic Peptide: 252 pg/mL — ABNORMAL HIGH (ref 0.0–100.0)

## 2019-07-15 LAB — COMPREHENSIVE METABOLIC PANEL
ALT: 7 U/L (ref 0–44)
AST: 15 U/L (ref 15–41)
Albumin: 3.4 g/dL — ABNORMAL LOW (ref 3.5–5.0)
Alkaline Phosphatase: 56 U/L (ref 38–126)
Anion gap: 9 (ref 5–15)
BUN: 31 mg/dL — ABNORMAL HIGH (ref 8–23)
CO2: 23 mmol/L (ref 22–32)
Calcium: 8.7 mg/dL — ABNORMAL LOW (ref 8.9–10.3)
Chloride: 108 mmol/L (ref 98–111)
Creatinine, Ser: 1.18 mg/dL — ABNORMAL HIGH (ref 0.44–1.00)
GFR calc Af Amer: 48 mL/min — ABNORMAL LOW (ref >60)
GFR calc non Af Amer: 41 mL/min — ABNORMAL LOW (ref >60)
Glucose, Bld: 97 mg/dL (ref 70–99)
Potassium: 4.2 mmol/L (ref 3.5–5.1)
Sodium: 140 mmol/L (ref 135–145)
Total Bilirubin: 1.9 mg/dL — ABNORMAL HIGH (ref 0.3–1.2)
Total Protein: 6.2 g/dL — ABNORMAL LOW (ref 6.5–8.1)

## 2019-07-15 LAB — LIPASE, BLOOD: Lipase: 76 U/L — ABNORMAL HIGH (ref 11–51)

## 2019-07-15 LAB — MRSA PCR SCREENING: MRSA by PCR: NEGATIVE

## 2019-07-15 LAB — SARS CORONAVIRUS 2 (TAT 6-24 HRS): SARS Coronavirus 2: NEGATIVE

## 2019-07-15 MED ORDER — SODIUM CHLORIDE 0.9 % IV SOLN
INTRAVENOUS | Status: DC
  Administered 2019-07-15: 10:00:00 via INTRAVENOUS

## 2019-07-15 MED ORDER — FUROSEMIDE 20 MG PO TABS: 20.0000 mg | ORAL_TABLET | Freq: Every day | ORAL | Status: DC

## 2019-07-15 MED ORDER — FUROSEMIDE 10 MG/ML IJ SOLN
20.0000 mg | Freq: Once | INTRAMUSCULAR | Status: AC
  Administered 2019-07-15: 20 mg via INTRAVENOUS
  Filled 2019-07-15: qty 4

## 2019-07-15 MED ORDER — ONDANSETRON HCL 4 MG/2ML IJ SOLN: 4.0000 mg | Freq: Four times a day (QID) | INTRAMUSCULAR | Status: DC | PRN

## 2019-07-15 MED ORDER — ENOXAPARIN SODIUM 30 MG/0.3ML ~~LOC~~ SOLN: 30.0000 mg | SUBCUTANEOUS | Status: DC

## 2019-07-15 MED ORDER — ATENOLOL 25 MG PO TABS
25.0000 mg | ORAL_TABLET | Freq: Every evening | ORAL | Status: DC
  Administered 2019-07-15: 12.5 mg via ORAL
  Filled 2019-07-15 (×2): qty 1

## 2019-07-15 MED ORDER — LACTATED RINGERS IV SOLN
INTRAVENOUS | Status: DC
  Administered 2019-07-15: 06:00:00 via INTRAVENOUS

## 2019-07-15 NOTE — Progress Notes (Signed)
PROGRESS NOTE  Brittney [name] [id number] DOB: 1930/05/12 DOA: 07/14/2019 PCP: Derinda Late, MD  HPI/Recap of past 5 hours: 85 year old female past medical history of TIA, hypertension and stage III chronic kidney disease admitted on 11/20 with 1 day of midepigastric pain radiating to her back and found to have acute pancreatitis noted by elevated lipase and confirmed by CT.  Admitted to the hospitalist service and made n.p.o.  This morning, patient's lipase down to 74.  Started on clear liquids which she is tolerating.  She is feeling comfortable with minimal pain.  She is trying to avoid taking any pain medication.  Assessment/Plan: Principal Problem:   Acute pancreatitis: Suspect that this is secondary to medication.  Does not drink alcohol and gallbladder unremarkable.  No history of trauma.  She is on Crestor and Lasix, both of which can cause pancreatitis.  Mild case.  Advance to full liquids this evening and if tolerating, solid food in the morning.  Change her Lovenox to SCDs to decrease risk of hemorrhagic pancreatitis Active Problems:   TIA (transient ischemic attack): Resume Plavix since we are stopping her Lovenox  Essential hypertension: Elevated blood pressures likely secondary to fluid retention.  See below.    CKD (chronic kidney disease) stage 3, GFR 30-59 ml/min: Stable, continue to monitor renal function.  Improved with IV fluids.   Acute systolic heart failure: Patient's echocardiogram done 11 months ago noted mild decrease in ejection fraction of 45%.  BNP slightly elevated today.  Will stop IV fluids and treat with some mild Lasix.    Coronary artery disease   Gout   Code Status: DNR  Family Communication: Husband at the bedside  Disposition Plan: Potential discharge tomorrow if tolerating solid food   Consultants:  None  Procedures:  None  Antimicrobials:  None  DVT prophylaxis: Lovenox changed to SCDs   Objective: Vitals:   07/15/19 0600 07/15/19 1215  BP: (!) 170/58 (!) 150/58  Pulse: 78 71  Resp:  16  Temp:  98.4 F (36.9 C)  SpO2: 96% 94%    Intake/Output Summary (Last 24 hours) at 07/15/2019 1339 Last data filed at 07/15/2019 0950 Gross per 24 hour  Intake 476.61 ml  Output 150 ml  Net 326.61 ml   Filed Weights   07/14/19 1517  Weight: 70.8 kg   Body mass index is 31.51 kg/m.  Exam:   General: Alert and oriented x3, no acute distress  HEENT: Normocephalic atraumatic, mucous membranes are moist  Neck: Supple, no JVD  Cardiovascular: Regular rate and rhythm, S1-S2  Respiratory: Clear to auscultation bilaterally  Abdomen: Soft, minimal tenderness in the midepigastric area, normoactive bowel sounds  Musculoskeletal: No clubbing or cyanosis, trace pitting edema  Skin: No skin breaks, tears or lesions  Neuro: No focal deficits  Psychiatry: Appropriate, no evidence of psychoses   Data Reviewed: CBC: Recent Labs  Lab 07/14/19 1520 07/15/19 0622  WBC 14.4* 13.6*  HGB 12.5 11.7*  HCT 37.1 35.0*  MCV 93.0 93.1  PLT 325 [id number]   Basic Metabolic Panel: Recent Labs  Lab 07/14/19 1520 07/15/19 0622  NA 141 140  K 4.1 4.2  CL 107 108  CO2 24 23  GLUCOSE 126* 97  BUN 34* 31*  CREATININE 1.41* 1.18*  CALCIUM 9.1 8.7*   GFR: Estimated Creatinine Clearance: 28.7 mL/min (A) (by C-G formula based on SCr of 1.18 mg/dL (H)). Liver Function Tests: Recent Labs  Lab 07/14/19 1520 07/15/19 0622  AST 16 15  ALT 8 7  ALKPHOS 57 56  BILITOT 1.7* 1.9*  PROT 6.7 6.2*  ALBUMIN 3.8 3.4*   Recent Labs  Lab 07/14/19 1520 07/15/19 0622  LIPASE 342* 76*   No results for input(s): AMMONIA in the last 168 hours. Coagulation Profile: No results for input(s): INR, PROTIME in the last 168 hours. Cardiac Enzymes: No results for input(s): CKTOTAL, CKMB, CKMBINDEX, TROPONINI in the last 168 hours. BNP (last 3 results) No results for input(s): PROBNP in the last 8760  hours. HbA1C: No results for input(s): HGBA1C in the last 72 hours. CBG: No results for input(s): GLUCAP in the last 168 hours. Lipid Profile: No results for input(s): CHOL, HDL, LDLCALC, TRIG, CHOLHDL, LDLDIRECT in the last 72 hours. Thyroid Function Tests: No results for input(s): TSH, T4TOTAL, FREET4, T3FREE, THYROIDAB in the last 72 hours. Anemia Panel: No results for input(s): VITAMINB12, FOLATE, FERRITIN, TIBC, IRON, RETICCTPCT in the last 72 hours. Urine analysis:    Component Value Date/Time   COLORURINE YELLOW (A) 07/14/2019 1520   APPEARANCEUR HAZY (A) 07/14/2019 1520   LABSPEC 1.027 07/14/2019 1520   PHURINE 5.0 07/14/2019 1520   GLUCOSEU NEGATIVE 07/14/2019 1520   HGBUR NEGATIVE 07/14/2019 [place] 07/14/2019 1520   KETONESUR NEGATIVE 07/14/2019 1520   PROTEINUR 30 (A) 07/14/2019 1520   NITRITE NEGATIVE 07/14/2019 1520   LEUKOCYTESUR LARGE (A) 07/14/2019 1520   Sepsis Labs: @LABRCNTIP (procalcitonin:4,lacticidven:4)  ) Recent Results (from the past 240 hour(s))  SARS CORONAVIRUS 2 (TAT 6-24 HRS) Nasopharyngeal Nasopharyngeal Swab     Status: None   Collection Time: 07/14/19  9:30 PM   Specimen: Nasopharyngeal Swab  Result Value Ref Range Status   SARS Coronavirus 2 NEGATIVE NEGATIVE Final    Comment: (NOTE) SARS-CoV-2 target nucleic acids are NOT DETECTED. The SARS-CoV-2 RNA is generally detectable in upper and lower respiratory specimens during the acute phase of infection. Negative results do not preclude SARS-CoV-2 infection, do not rule out co-infections with other pathogens, and should not be used as the sole basis for treatment or other patient management decisions. Negative results must be combined with clinical observations, patient history, and epidemiological information. The expected result is Negative. Fact Sheet for Patients: SugarRoll.be Fact Sheet for Healthcare  Providers: https://www.woods-mathews.com/ This test is not yet approved or cleared by the [place] FDA and  has been authorized for detection and/or diagnosis of SARS-CoV-2 by FDA under an Emergency Use Authorization (EUA). This EUA will remain  in effect (meaning this test can be used) for the duration of the COVID-19 declaration under Section 56 4(b)(1) of the Act, 21 U.S.C. section 360bbb-3(b)(1), unless the authorization is terminated or revoked sooner. Performed at [facility], [place] [address], [place], [place] [address]   MRSA PCR Screening     Status: None   Collection Time: 07/15/19  7:31 AM   Specimen: Nasopharyngeal  Result Value Ref Range Status   MRSA by PCR NEGATIVE NEGATIVE Final    Comment:        The GeneXpert MRSA Assay (FDA approved for NASAL specimens only), is one component of a comprehensive MRSA colonization surveillance program. It is not intended to diagnose MRSA infection nor to guide or monitor treatment for MRSA infections. Performed at [facility], [address] [place],  [address]       Studies: Ct Abdomen Pelvis Wo Contrast  Result Date: 07/14/2019 CLINICAL DATA:  Pt to ED via POV c/o "infection in my gut". Pt states that she has seen by  her PCP who told her that her white blood count was elevated. Pt states that she is having pain in her abdomen and her back. EXAM: CT ABDOMEN AND PELVIS WITHOUT CONTRAST TECHNIQUE: Multidetector CT imaging of the abdomen and pelvis was performed following the standard protocol without IV contrast. COMPARISON:  Current right upper quadrant ultrasound. FINDINGS: Lower chest: Lung base opacities consistent with atelectasis and/or scarring. No acute findings. Hepatobiliary: 11 mm low-attenuation lesion at the dome of segment 7, consistent with a cyst. No other liver masses or lesions. Liver normal in size and overall attenuation. Normal gallbladder. No bile duct dilation.  Pancreas: There are inflammatory changes adjacent to the pancreatic tail, which extend along the left anterior pararenal fascia. Remainder of the pancreas is unremarkable. No pancreatic masses. Spleen: Normal in size without focal abnormality. Adrenals/Urinary Tract: No adrenal masses. Bilateral low-attenuation renal masses. Largest arises from the posterior upper pole of the left kidney, 6.8 cm in long axis. These are all consistent with cysts. Bilateral renal cortical thinning. No stones. No hydronephrosis. Normal ureters. Normal bladder. Stomach/Bowel: Moderate-sized hiatal hernia. Stomach otherwise unremarkable. Small bowel and colon are normal in caliber. No wall thickening. Inflammatory changes lie adjacent to the upper descending colon, but appear to originate from the pancreatic tail. There are scattered colonic diverticula without inflammation. Normal appendix visualized. Vascular/Lymphatic: Aortic atherosclerosis. No aneurysm. No enlarged lymph nodes. Reproductive: Status post hysterectomy. No adnexal masses. Other: No ascites.  No abdominal wall hernia. Musculoskeletal: Moderate compression deformity of T10 that appears chronic. No convincing acute fracture. Grade 1 anterolisthesis of L4 on L5 and L5 on S1. There are no bone lesions. IMPRESSION: 1. Inflammatory changes adjacent to the pancreatic tail and along the left anterior pararenal fascia extending to the left pericolic gutter. Findings are consistent with acute pancreatitis. No collection is seen to suggest an abscess or pseudocyst. 2. No other acute abnormality within the abdomen or pelvis. 3. Colonic diverticula without evidence of diverticulitis. 4. Moderate hiatal hernia. 5. Aortic atherosclerosis. 6. Renal cortical thinning and renal cysts. Electronically Signed   By: Lajean Manes M.D.   On: 07/14/2019 21:26   US Abdomen Limited Ruq  Result Date: 07/14/2019 CLINICAL DATA:  Abdominal pain, prior hysterectomy EXAM: ULTRASOUND ABDOMEN  LIMITED RIGHT UPPER QUADRANT COMPARISON:  None. FINDINGS: Gallbladder: No gallstones or wall thickening visualized. No sonographic Murphy sign noted by sonographer. Common bile duct: Diameter: 2.3 mm, nondilated Liver: No focal lesion identified. Within normal limits in parenchymal echogenicity. Portal vein is patent on color Doppler imaging with normal direction of blood flow towards the liver. Other: Incidentally noted 1.8 cm right anechoic renal cyst. Right renal echogenicity is diffusely increased as well compatible with patient's history of medical renal disease/CKD III. IMPRESSION: Normal gallbladder and liver. Incidentally noted right renal cyst and diffusely increased right renal echogenicity compatible with history of medical renal disease/CKD. Electronically Signed   By: Lovena Le M.D.   On: 07/14/2019 20:05    Scheduled Meds:  atenolol  25 mg Oral QPM   ondansetron (ZOFRAN) IV  4 mg Intravenous Once   sodium chloride flush  3 mL Intravenous Once    Continuous Infusions:  sodium chloride 50 mL/hr at 07/15/19 0950     LOS: 1 day     Annita Brod, MD [facility]  To reach me or the doctor on call, go to: [email or web address] Password [email or web address]  07/15/2019, 1:39 PM

## 2019-07-15 NOTE — Progress Notes (Signed)
Report given to floor RN. Patient transferred to floor via stretcher.

## 2019-07-15 NOTE — Plan of Care (Signed)
Patient doing well today.  Minimal pain when she gets up.  Tolerating clear liquid diet.  No significant changes.

## 2019-07-16 LAB — CBC
HCT: 35 % — ABNORMAL LOW (ref 36.0–46.0)
Hemoglobin: 11.2 g/dL — ABNORMAL LOW (ref 12.0–15.0)
MCH: 31 pg (ref 26.0–34.0)
MCHC: 32 g/dL (ref 30.0–36.0)
MCV: 97 fL (ref 80.0–100.0)
Platelets: 276 10*3/uL (ref 150–400)
RBC: 3.61 MIL/uL — ABNORMAL LOW (ref 3.87–5.11)
RDW: 13.1 % (ref 11.5–15.5)
WBC: 13.6 10*3/uL — ABNORMAL HIGH (ref 4.0–10.5)
nRBC: 0 % (ref 0.0–0.2)

## 2019-07-16 LAB — COMPREHENSIVE METABOLIC PANEL
ALT: 7 U/L (ref 0–44)
AST: 15 U/L (ref 15–41)
Albumin: 3 g/dL — ABNORMAL LOW (ref 3.5–5.0)
Alkaline Phosphatase: 55 U/L (ref 38–126)
Anion gap: 10 (ref 5–15)
BUN: 24 mg/dL — ABNORMAL HIGH (ref 8–23)
CO2: 22 mmol/L (ref 22–32)
Calcium: 8.4 mg/dL — ABNORMAL LOW (ref 8.9–10.3)
Chloride: 107 mmol/L (ref 98–111)
Creatinine, Ser: 1.18 mg/dL — ABNORMAL HIGH (ref 0.44–1.00)
GFR calc Af Amer: 48 mL/min — ABNORMAL LOW (ref >60)
GFR calc non Af Amer: 41 mL/min — ABNORMAL LOW (ref >60)
Glucose, Bld: 94 mg/dL (ref 70–99)
Potassium: 3.9 mmol/L (ref 3.5–5.1)
Sodium: 139 mmol/L (ref 135–145)
Total Bilirubin: 2.1 mg/dL — ABNORMAL HIGH (ref 0.3–1.2)
Total Protein: 5.7 g/dL — ABNORMAL LOW (ref 6.5–8.1)

## 2019-07-16 LAB — LIPASE, BLOOD: Lipase: 30 U/L (ref 11–51)

## 2019-07-16 MED ORDER — FUROSEMIDE 10 MG/ML IJ SOLN
20.0000 mg | Freq: Once | INTRAMUSCULAR | Status: AC
  Administered 2019-07-16: 20 mg via INTRAVENOUS
  Filled 2019-07-16: qty 4

## 2019-07-16 NOTE — Discharge Summary (Signed)
Discharge Summary  Brittney [name] [id number] DOB: [birth year]  PCP: Derinda Late, MD  Admit date: 07/14/2019 Discharge date: 07/16/2019  Time spent: 25 minutes  Recommendations for Outpatient Follow-up:  1. Medication change: Crestor will be put on hold 2. Patient will follow-up with her PCP in the next 1 week  Discharge Diagnoses:  Active Hospital Problems   Diagnosis Date Noted   Acute pancreatitis [id number]   Acute systolic CHF (congestive heart failure) ([place]) 07/15/2019   CKD (chronic kidney disease) stage 3, GFR 30-59 ml/min 07/14/2019   Essential hypertension 07/14/2019   Coronary artery disease 07/14/2019   TIA (transient ischemic attack) 08/19/2018    Resolved Hospital Problems  No resolved problems to display.    Discharge Condition: Improved, being discharged home  Diet recommendation: Heart healthy  Vitals:   07/15/19 2030 07/16/19 0439  BP: (!) 147/55 (!) 151/50  Pulse: 72 73  Resp: 16 20  Temp: 98.6 F (37 C) 98.2 F (36.8 C)  SpO2: 94% 95%    History of present illness:  85 year old female past medical history of TIA, hypertension and stage III chronic kidney disease admitted on 11/20 with 1 day of midepigastric pain radiating to her back and found to have acute pancreatitis noted by elevated lipase and confirmed by CT.  Admitted to the hospitalist service and made n.p.o.   Hospital Course:  Principal Problem:   Acute pancreatitis: Patient did well.  By following day, lipase only minimally elevated at 74.  Started on clear liquids which she tolerated well.  This was able to be advanced to full liquids by that evening and by the following morning, tolerating solid food with normal lipase levels.  Suspect that this is secondary to medication.  Does not drink alcohol and gallbladder unremarkable.  No history of trauma.  She is on Crestor and Lasix, both of which can cause pancreatitis.  Statins seem more likely to cause pancreatitis than  lasix & with her heart failure, am less inclined to stop her lasix.  Will try taking her off crestor.  Have her follow up with her PCP next week.  Active Problems:   TIA (transient ischemic attack): Stable, continued on Plavix.    CKD (chronic kidney disease) stage 3, GFR 30-59 ml/min: Stable.  Had some improvement with initial IV fluids and then when this was stopped and continued on Lasix, her creatinine has since been stable.    Essential hypertension: Elevated during hospitalization in part from fluid retention.  See below.  Improved after Lasix.    Coronary artery disease    Acute systolic CHF (congestive heart failure) ([place]): Patient noted to have persistently elevated blood pressures in the 170s.  Her echocardiogram done 11 months ago noted a mild decrease in ejection fraction of 45%.  BNP checked on 11/21 and mildly elevated at 252.  Patient received doses of IV Lasix.  I discussed this with her.  And she tells me that she does not take her Lasix that often, only about once every 3 days although it has been prescribed to her to take it once every day.  Advised her to go ahead and try to take her Lasix every day as well as weigh herself.  She will follow-up with her PCP.  There is also a possibility, that Lasix could have caused her pancreatitis, see above.   Procedures:  None  Consultations:  None  Discharge Exam: BP (!) 151/50 (BP Location: Right Arm)    Pulse 73    Temp 98.2  F (36.8 C) (Oral)    Resp 20    Ht 4\' 11"  (1.499 m)    Wt 74.5 kg    SpO2 95%    BMI 33.17 kg/m   General: Alert and oriented x3, no acute distress Cardiovascular: Regular rate and rhythm, S1-S2 Respiratory: Clear to auscultation bilaterally  Discharge Instructions You were cared for by a hospitalist during your hospital stay. If you have any questions about your discharge medications or the care you received while you were in the hospital after you are discharged, you can call the unit and asked to  speak with the hospitalist on call if the hospitalist that took care of you is not available. Once you are discharged, your primary care physician will handle any further medical issues. Please note that NO REFILLS for any discharge medications will be authorized once you are discharged, as it is imperative that you return to your primary care physician (or establish a relationship with a primary care physician if you do not have one) for your aftercare needs so that they can reassess your need for medications and monitor your lab values.  Discharge Instructions    Diet - low sodium heart healthy   Complete by: As directed    Increase activity slowly   Complete by: As directed      Allergies as of 07/16/2019      Reactions   Azithromycin    Baclofen    Iodine    CKD   Lisinopril    Sulfa Antibiotics       Medication List    STOP taking these medications   rosuvastatin 10 MG tablet Commonly known as: CRESTOR     TAKE these medications   allopurinol 100 MG tablet Commonly known as: ZYLOPRIM Take 100 mg by mouth daily.   aspirin EC 81 MG tablet Take 81 mg by mouth daily.   atenolol 25 MG tablet Commonly known as: TENORMIN Take 12.5 mg by mouth every evening.   clopidogrel 75 MG tablet Commonly known as: PLAVIX Take 75 mg by mouth daily.   fluticasone 50 MCG/ACT nasal spray Commonly known as: FLONASE Place 2 sprays into the nose daily.   furosemide 20 MG tablet Commonly known as: LASIX Take 20 mg by mouth daily.   loratadine 10 MG tablet Commonly known as: CLARITIN Take 10 mg by mouth daily.   Pazeo 0.7 % Soln Generic drug: Olopatadine HCl Place 1 drop into both eyes daily.   vitamin B-12 1000 MCG tablet Commonly known as: CYANOCOBALAMIN Take 1,000 mcg by mouth daily.   Vitamin D3 25 MCG (1000 UT) Caps Take 1,000 Units by mouth daily.      Allergies  Allergen Reactions   Azithromycin    Baclofen    Iodine     CKD   Lisinopril    Sulfa  Antibiotics    Follow-up Information    Derinda Late, MD Follow up in 1 week(s).   Specialty: Family Medicine Contact information: [address] [employer] [facility] [place] [address] [phone]            The results of significant diagnostics from this hospitalization (including imaging, microbiology, ancillary and laboratory) are listed below for reference.    Significant Diagnostic Studies: Ct Abdomen Pelvis Wo Contrast  Result Date: 07/14/2019 CLINICAL DATA:  Pt to ED via POV c/o "infection in my gut". Pt states that she has seen by her PCP who told her that her  white blood count was elevated. Pt states that she is having pain in her abdomen and her back. EXAM: CT ABDOMEN AND PELVIS WITHOUT CONTRAST TECHNIQUE: Multidetector CT imaging of the abdomen and pelvis was performed following the standard protocol without IV contrast. COMPARISON:  Current right upper quadrant ultrasound. FINDINGS: Lower chest: Lung base opacities consistent with atelectasis and/or scarring. No acute findings. Hepatobiliary: 11 mm low-attenuation lesion at the dome of segment 7, consistent with a cyst. No other liver masses or lesions. Liver normal in size and overall attenuation. Normal gallbladder. No bile duct dilation. Pancreas: There are inflammatory changes adjacent to the pancreatic tail, which extend along the left anterior pararenal fascia. Remainder of the pancreas is unremarkable. No pancreatic masses. Spleen: Normal in size without focal abnormality. Adrenals/Urinary Tract: No adrenal masses. Bilateral low-attenuation renal masses. Largest arises from the posterior upper pole of the left kidney, 6.8 cm in long axis. These are all consistent with cysts. Bilateral renal cortical thinning. No stones. No hydronephrosis. Normal ureters. Normal bladder. Stomach/Bowel: Moderate-sized hiatal hernia. Stomach otherwise unremarkable. Small bowel and colon are normal in  caliber. No wall thickening. Inflammatory changes lie adjacent to the upper descending colon, but appear to originate from the pancreatic tail. There are scattered colonic diverticula without inflammation. Normal appendix visualized. Vascular/Lymphatic: Aortic atherosclerosis. No aneurysm. No enlarged lymph nodes. Reproductive: Status post hysterectomy. No adnexal masses. Other: No ascites.  No abdominal wall hernia. Musculoskeletal: Moderate compression deformity of T10 that appears chronic. No convincing acute fracture. Grade 1 anterolisthesis of L4 on L5 and L5 on S1. There are no bone lesions. IMPRESSION: 1. Inflammatory changes adjacent to the pancreatic tail and along the left anterior pararenal fascia extending to the left pericolic gutter. Findings are consistent with acute pancreatitis. No collection is seen to suggest an abscess or pseudocyst. 2. No other acute abnormality within the abdomen or pelvis. 3. Colonic diverticula without evidence of diverticulitis. 4. Moderate hiatal hernia. 5. Aortic atherosclerosis. 6. Renal cortical thinning and renal cysts. Electronically Signed   By: Lajean Manes M.D.   On: 07/14/2019 21:26   US Abdomen Limited Ruq  Result Date: 07/14/2019 CLINICAL DATA:  Abdominal pain, prior hysterectomy EXAM: ULTRASOUND ABDOMEN LIMITED RIGHT UPPER QUADRANT COMPARISON:  None. FINDINGS: Gallbladder: No gallstones or wall thickening visualized. No sonographic Murphy sign noted by sonographer. Common bile duct: Diameter: 2.3 mm, nondilated Liver: No focal lesion identified. Within normal limits in parenchymal echogenicity. Portal vein is patent on color Doppler imaging with normal direction of blood flow towards the liver. Other: Incidentally noted 1.8 cm right anechoic renal cyst. Right renal echogenicity is diffusely increased as well compatible with patient's history of medical renal disease/CKD III. IMPRESSION: Normal gallbladder and liver. Incidentally noted right renal cyst and  diffusely increased right renal echogenicity compatible with history of medical renal disease/CKD. Electronically Signed   By: Lovena Le M.D.   On: 07/14/2019 20:05    Microbiology: Recent Results (from the past 240 hour(s))  SARS CORONAVIRUS 2 (TAT 6-24 HRS) Nasopharyngeal Nasopharyngeal Swab     Status: None   Collection Time: 07/14/19  9:30 PM   Specimen: Nasopharyngeal Swab  Result Value Ref Range Status   SARS Coronavirus 2 NEGATIVE NEGATIVE Final    Comment: (NOTE) SARS-CoV-2 target nucleic acids are NOT DETECTED. The SARS-CoV-2 RNA is generally detectable in upper and lower respiratory specimens during the acute phase of infection. Negative results do not preclude SARS-CoV-2 infection, do not rule out co-infections with other pathogens, and should not  be used as the sole basis for treatment or other patient management decisions. Negative results must be combined with clinical observations, patient history, and epidemiological information. The expected result is Negative. Fact Sheet for Patients: SugarRoll.be Fact Sheet for Healthcare Providers: https://www.woods-mathews.com/ This test is not yet approved or cleared by the [place] FDA and  has been authorized for detection and/or diagnosis of SARS-CoV-2 by FDA under an Emergency Use Authorization (EUA). This EUA will remain  in effect (meaning this test can be used) for the duration of the COVID-19 declaration under Section 56 4(b)(1) of the Act, 21 U.S.C. section 360bbb-3(b)(1), unless the authorization is terminated or revoked sooner. Performed at [facility], [place] [address], [place], [place] [address]   MRSA PCR Screening     Status: None   Collection Time: 07/15/19  7:31 AM   Specimen: Nasopharyngeal  Result Value Ref Range Status   MRSA by PCR NEGATIVE NEGATIVE Final    Comment:        The GeneXpert MRSA Assay (FDA approved for NASAL specimens only), is one  component of a comprehensive MRSA colonization surveillance program. It is not intended to diagnose MRSA infection nor to guide or monitor treatment for MRSA infections. Performed at [facility], [address] [place], [place] [address]      Labs: Basic Metabolic Panel: Recent Labs  Lab 07/14/19 1520 07/15/19 0622 07/16/19 0551  NA 141 140 139  K 4.1 4.2 3.9  CL 107 108 107  CO2 24 23 22   GLUCOSE 126* 97 94  BUN 34* 31* 24*  CREATININE 1.41* 1.18* 1.18*  CALCIUM 9.1 8.7* 8.4*   Liver Function Tests: Recent Labs  Lab 07/14/19 1520 07/15/19 0622 07/16/19 0551  AST 16 15 15   ALT 8 7 7   ALKPHOS 57 56 55  BILITOT 1.7* 1.9* 2.1*  PROT 6.7 6.2* 5.7*  ALBUMIN 3.8 3.4* 3.0*   Recent Labs  Lab 07/14/19 1520 07/15/19 0622 07/16/19 0551  LIPASE 342* 76* 30   No results for input(s): AMMONIA in the last 168 hours. CBC: Recent Labs  Lab 07/14/19 1520 07/15/19 0622 07/16/19 0551  WBC 14.4* 13.6* 13.6*  HGB 12.5 11.7* 11.2*  HCT 37.1 35.0* 35.0*  MCV 93.0 93.1 97.0  PLT 325 285 276   Cardiac Enzymes: No results for input(s): CKTOTAL, CKMB, CKMBINDEX, TROPONINI in the last 168 hours. BNP: BNP (last 3 results) Recent Labs    07/15/19 0622  BNP 252.0*    ProBNP (last 3 results) No results for input(s): PROBNP in the last 8760 hours.  CBG: No results for input(s): GLUCAP in the last 168 hours.     Signed:  Annita Brod, MD [facility] 07/16/2019, 10:41 AM

## 2019-07-16 NOTE — Progress Notes (Signed)
MD ordered patient to be discharged home.  Discharge instructions were reviewed with the patient and she voiced understanding. Instructions on making own follow-up appointment.  No prescriptions given to the patient.  IV was removed with catheter intact.  All patients questions were answered.  Patient left via wheelchair escorted by NT.

## 2019-07-16 NOTE — Discharge Instructions (Signed)
Acute Pancreatitis  Acute pancreatitis happens when the pancreas gets swollen. The pancreas is a large gland in the body that helps to control blood sugar. It also makes enzymes that help to digest food. This condition can last a few days and cause serious problems. The lungs, heart, and kidneys may stop working. What are the causes? Causes include:  Alcohol abuse.  Drug abuse.  Gallstones.  A tumor in the pancreas. Other causes include:  Some medicines.  Some chemicals.  Diabetes.  An infection.  Damage caused by an accident.  The poison (venom) from a scorpion bite.  Belly (abdominal) surgery.  The body's defense system (immune system) attacking the pancreas (autoimmune pancreatitis).  Genes that are passed from parent to child (inherited). In some cases, the cause is not known. What are the signs or symptoms?  Pain in the upper belly that may be felt in the back. The pain may be very bad.  Swelling of the belly.  Feeling sick to your stomach (nauseous) and throwing up (vomiting).  Fever. How is this treated? You will likely have to stay in the hospital. Treatment may include:  Pain medicine.  Fluid through an IV tube.  Placing a tube in the stomach to take out the stomach contents. This may help you stop throwing up.  Not eating for 3-4 days.  Antibiotic medicines, if you have an infection.  Treating any other problems that may be the cause.  Steroid medicines, if your problem is caused by your defense system attacking your body's own tissues.  Surgery. Follow these instructions at home: Eating and drinking   Follow instructions from your doctor about what to eat and drink.  Eat foods that do not have a lot of fat in them.  Eat small meals often. Do not eat big meals.  Drink enough fluid to keep your pee (urine) pale yellow.  Do not drink alcohol if it caused your condition. Medicines  Take over-the-counter and prescription medicines only  as told by your doctor.  Ask your doctor if the medicine prescribed to you: ? Requires you to avoid driving or using heavy machinery. ? Can cause trouble pooping (constipation). You may need to take steps to prevent or treat trouble pooping:  Take over-the-counter or prescription medicines.  Eat foods that are high in fiber. These include beans, whole grains, and fresh fruits and vegetables.  Limit foods that are high in fat and sugar. These include fried or sweet foods. General instructions  Do not use any products that contain nicotine or tobacco, such as cigarettes, e-cigarettes, and chewing tobacco. If you need help quitting, ask your doctor.  Get plenty of rest.  Check your blood sugar at home as told by your doctor.  Keep all follow-up visits as told by your doctor. This is important. Contact a doctor if:  You do not get better as quickly as expected.  You have new symptoms.  Your symptoms get worse.  You have pain or weakness that lasts a long time.  You keep feeling sick to your stomach.  You get better and then you have pain again.  You have a fever. Get help right away if:  You cannot eat or keep fluids down.  Your pain gets very bad.  Your skin or the white part of your eyes turns yellow.  You have sudden swelling in your belly.  You throw up.  You feel dizzy or you pass out (faint).  Your blood sugar is high (over 300  mg/dL). Summary  Acute pancreatitis happens when the pancreas gets swollen.  This condition is often caused by alcohol abuse, drug abuse, or gallstones.  You will likely have to stay in the hospital for treatment. This information is not intended to replace advice given to you by your health care provider. Make sure you discuss any questions you have with your health care provider. Document Released: 01/27/2008 Document Revised: 05/30/2018 Document Reviewed: 05/30/2018 Elsevier Patient Education  2020 [employer].  Heart  Failure, Self Care Heart failure is a serious condition. This sheet explains things you need to do to take care of yourself at home. To help you stay as healthy as possible, you may be asked to change your diet, take certain medicines, and make other changes in your life. Your doctor may also give you more specific instructions. If you have problems or questions, call your doctor. What are the risks? Having heart failure makes it more likely for you to have some problems. These problems can get worse if you do not take good care of yourself. Problems may include:  Blood clotting problems. This may cause a stroke.  Damage to the kidneys, liver, or lungs.  Abnormal heart rhythms. Supplies needed:  Scale for weighing yourself.  Blood pressure monitor.  Notebook.  Medicines. How to care for yourself when you have heart failure Medicines Take over-the-counter and prescription medicines only as told by your doctor. Take your medicines every day.  Do not stop taking your medicine unless your doctor tells you to do so.  Do not skip any medicines.  Get your prescriptions refilled before you run out of medicine. This is important. Eating and drinking   Eat heart-healthy foods. Talk with a diet specialist (dietitian) to create an eating plan.  Choose foods that: ? Have no trans fat. ? Are low in saturated fat and cholesterol.  Choose healthy foods, such as: ? Fresh or frozen fruits and vegetables. ? Fish. ? Low-fat (lean) meats. ? Legumes, such as beans, peas, and lentils. ? Fat-free or low-fat dairy products. ? Whole-grain foods. ? High-fiber foods.  Limit salt (sodium) if told by your doctor. Ask your diet specialist to tell you which seasonings are healthy for your heart.  Cook in healthy ways instead of frying. Healthy ways of cooking include roasting, grilling, broiling, baking, poaching, steaming, and stir-frying.  Limit how much fluid you drink, if told by your  doctor. Alcohol use  Do not drink alcohol if: ? Your doctor tells you not to drink. ? Your heart was damaged by alcohol, or you have very bad heart failure. ? You are pregnant, may be pregnant, or are planning to become pregnant.  If you drink alcohol: ? Limit how much you use to:  0-1 drink a day for women.  0-2 drinks a day for men. ? Be aware of how much alcohol is in your drink. In the [place] one drink equals one 12 oz bottle of beer (355 mL), one 5 oz glass of wine (148 mL), or one 1 oz glass of hard liquor (44 mL). Lifestyle   Do not use any products that contain nicotine or tobacco, such as cigarettes, e-cigarettes, and chewing tobacco. If you need help quitting, ask your doctor. ? Do not use nicotine gum or patches before talking to your doctor.  Do not use illegal drugs.  Lose weight if told by your doctor.  Do physical activity if told by your doctor. Talk to your doctor before you begin an  exercise if: ? You are an older adult. ? You have very bad heart failure.  Learn to manage stress. If you need help, ask your doctor.  Get rehab (rehabilitation) to help you stay independent and to help with your quality of life.  Plan time to rest when you get tired. Check weight and blood pressure   Weigh yourself every day. This will help you to know if fluid is building up in your body. ? Weigh yourself every morning after you pee (urinate) and before you eat breakfast. ? Wear the same amount of clothing each time. ? Write down your daily weight. Give your record to your doctor.  Check and write down your blood pressure as told by your doctor.  Check your pulse as told by your doctor. Dealing with very hot and very cold weather  If it is very hot: ? Avoid activities that take a lot of energy. ? Use air conditioning or fans, or find a cooler place. ? Avoid caffeine and alcohol. ? Wear clothing that is loose-fitting, lightweight, and light-colored.  If it is very  cold: ? Avoid activities that take a lot of energy. ? Layer your clothes. ? Wear mittens or gloves, a hat, and a scarf when you go outside. ? Avoid alcohol. Follow these instructions at home:  Stay up to date with shots (vaccines). Get pneumococcal and flu (influenza) shots.  Keep all follow-up visits as told by your doctor. This is important. Contact a doctor if:  You gain weight quickly.  You have increasing shortness of breath.  You cannot do your normal activities.  You get tired easily.  You cough a lot.  You don't feel like eating or feel like you may vomit (nauseous).  You become puffy (swell) in your hands, feet, ankles, or belly (abdomen).  You cannot sleep well because it is hard to breathe.  You feel like your heart is beating fast (palpitations).  You get dizzy when you stand up. Get help right away if:  You have trouble breathing.  You or someone else notices a change in your behavior, such as having trouble staying awake.  You have chest pain or discomfort.  You pass out (faint). These symptoms may be an emergency. Do not wait to see if the symptoms will go away. Get medical help right away. Call your local emergency services (911 in the [place] Do not drive yourself to the hospital. Summary  Heart failure is a serious condition. To care for yourself, you may have to change your diet, take medicines, and make other lifestyle changes.  Take your medicines every day. Do not stop taking them unless your doctor tells you to do so.  Eat heart-healthy foods, such as fresh or frozen fruits and vegetables, fish, lean meats, legumes, fat-free or low-fat dairy products, and whole-grain or high-fiber foods.  Ask your doctor if you can drink alcohol. You may have to stop alcohol use if you have very bad heart failure.  Contact your doctor if you gain weight quickly or feel that your heart is beating too fast. Get help right away if you pass out, or have chest pain  or trouble breathing. This information is not intended to replace advice given to you by your health care provider. Make sure you discuss any questions you have with your health care provider. Document Released: 11/23/2018 Document Revised: 11/22/2018 Document Reviewed: 11/23/2018 Elsevier Patient Education  2020 [employer].

## 2019-11-10 ENCOUNTER — Other Ambulatory Visit: Payer: Self-pay | Admitting: Family Medicine

## 2019-11-10 DIAGNOSIS — Z1231 Encounter for screening mammogram for malignant neoplasm of breast: Secondary | ICD-10-CM

## 2019-11-24 ENCOUNTER — Ambulatory Visit
Admission: RE | Admit: 2019-11-24 | Discharge: 2019-11-24 | Disposition: A | Discharge status: Home / Self Care | Payer: Medicare Other | Source: Ambulatory Visit | Attending: Family Medicine | Admitting: Family Medicine

## 2019-11-24 DIAGNOSIS — Z1231 Encounter for screening mammogram for malignant neoplasm of breast: Secondary | ICD-10-CM | POA: Insufficient documentation

## 2019-12-12 ENCOUNTER — Other Ambulatory Visit: Payer: Self-pay | Admitting: Family Medicine

## 2019-12-12 DIAGNOSIS — N6489 Other specified disorders of breast: Secondary | ICD-10-CM

## 2019-12-12 DIAGNOSIS — R921 Mammographic calcification found on diagnostic imaging of breast: Secondary | ICD-10-CM

## 2019-12-12 DIAGNOSIS — R928 Other abnormal and inconclusive findings on diagnostic imaging of breast: Secondary | ICD-10-CM

## 2019-12-21 ENCOUNTER — Ambulatory Visit
Admission: RE | Admit: 2019-12-21 | Discharge: 2019-12-21 | Disposition: A | Discharge status: Home / Self Care | Payer: Medicare Other | Source: Ambulatory Visit | Attending: Family Medicine | Admitting: Family Medicine

## 2019-12-21 DIAGNOSIS — N6489 Other specified disorders of breast: Secondary | ICD-10-CM

## 2019-12-21 DIAGNOSIS — R928 Other abnormal and inconclusive findings on diagnostic imaging of breast: Secondary | ICD-10-CM | POA: Diagnosis present

## 2019-12-21 DIAGNOSIS — R921 Mammographic calcification found on diagnostic imaging of breast: Secondary | ICD-10-CM

## 2020-01-29 IMAGING — MR MR MRA HEAD W/O CM
10 of 11 series · 33 of 48 positions shown · non-contrast
Comparison: Prior CT from earlier the same day.

CLINICAL DATA: Initial evaluation for acute speech difficulty.

EXAM:
MRI HEAD WITHOUT CONTRAST
MRA HEAD WITHOUT CONTRAST
TECHNIQUE: Multiplanar, multiecho pulse sequences of the brain and surrounding
structures were obtained without intravenous contrast. Angiographic
images of the head were obtained using MRA technique without
contrast.

[Series 2: ax dwi_tracew · axial · 3.0mm · 0.83mm/px · z∈[-81,+81]mm · 6 of 55 slices shown]
[im 1/55]
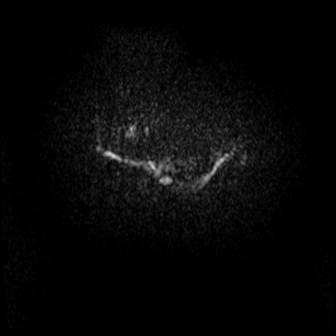
[im 11/55]
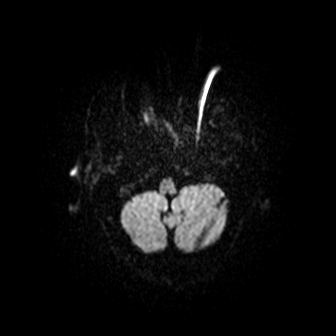
[im 22/55]
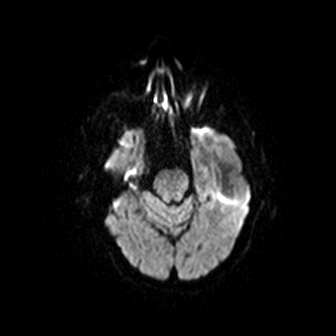
[im 33/55]
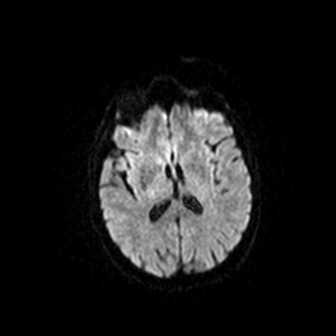
[im 44/55]
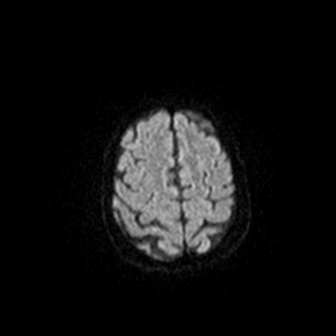
[im 55/55]
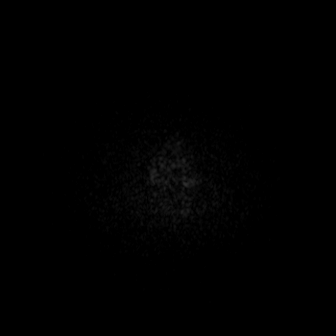

[Series 3: ax dwi_adc · axial · 3.0mm · 0.83mm/px · z∈[-81,+81]mm · 5 of 55 slices shown]
[im 1/55]
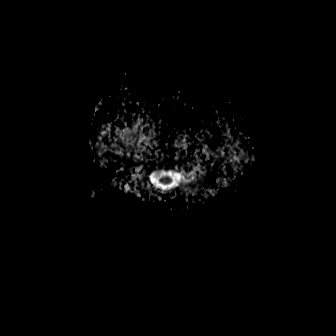
[im 14/55]
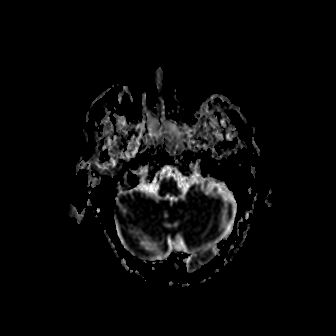
[im 28/55]
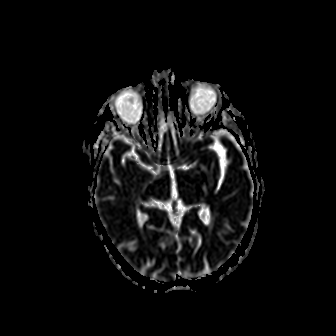
[im 41/55]
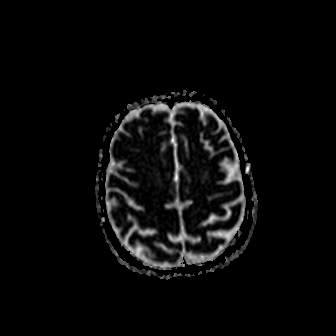
[im 55/55]
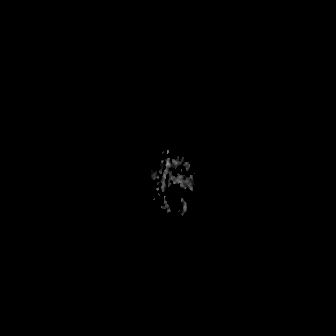

[Series 4: cor dwi_tracew · coronal · 5.0mm · 0.68mm/px · 3 of 37 slices shown]
[im 1/37]
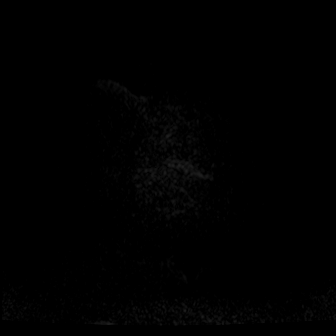
[im 19/37]
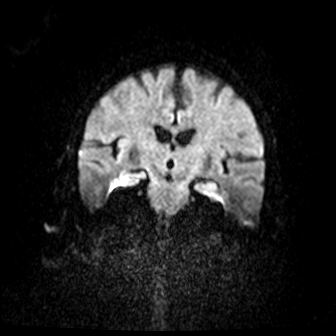
[im 37/37]
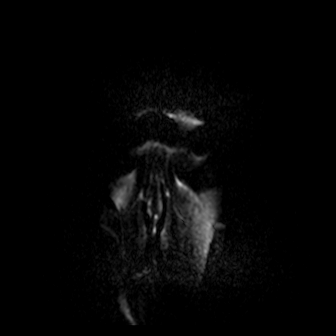

[Series 5: cor dwi_adc · coronal · 5.0mm · 0.68mm/px · 3 of 37 slices shown]
[im 1/37]
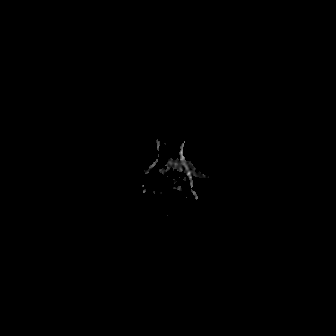
[im 19/37]
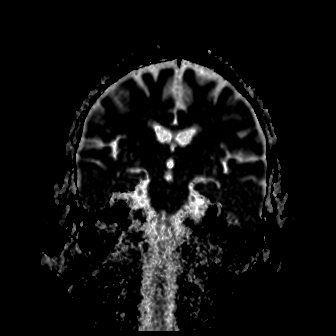
[im 37/37]
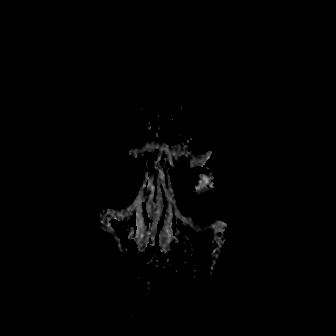

[Series 6: TOF · axial · 0.5mm · 0.41mm/px · z∈[-24,+28]mm · 5 of 205 slices shown]
[im 13/205]
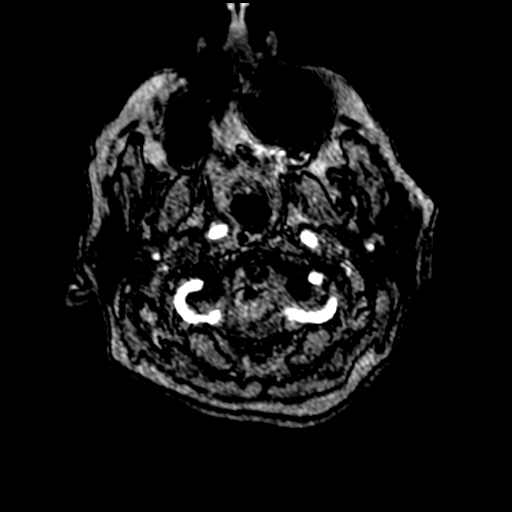
[im 37/205]
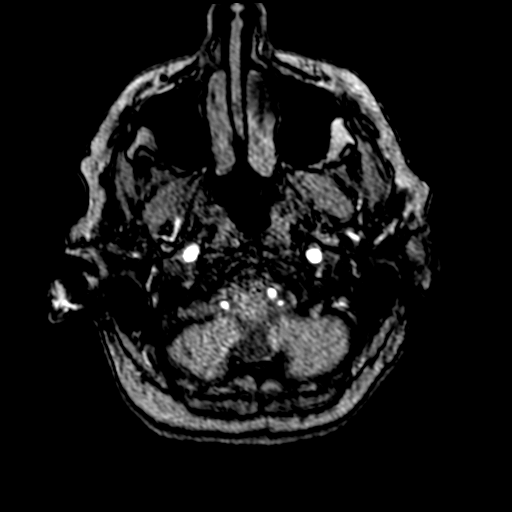
[im 61/205]
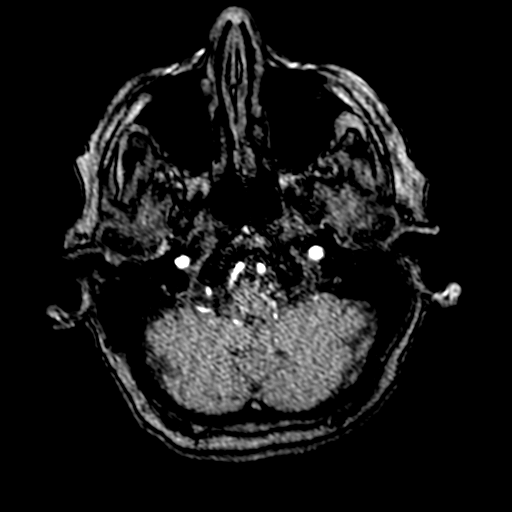
[im 85/205]
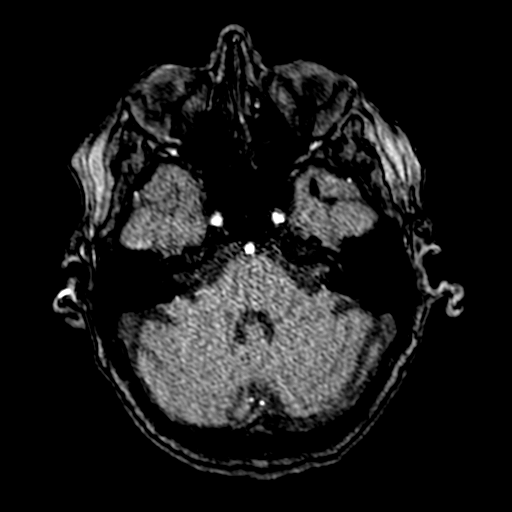
[im 121/205]
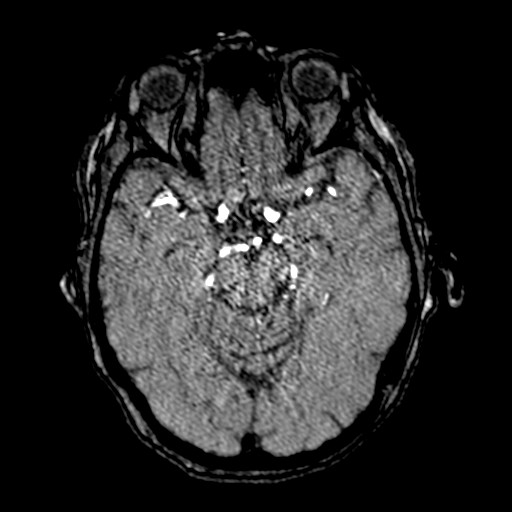

[Series 11: FLAIR · axial · 5.0mm · 0.90mm/px · z∈[-78,+78]mm · 2 of 27 slices shown]
[im 1/27]
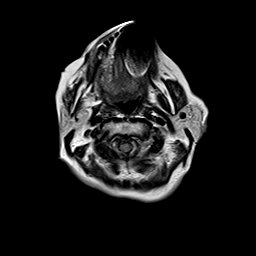
[im 27/27]
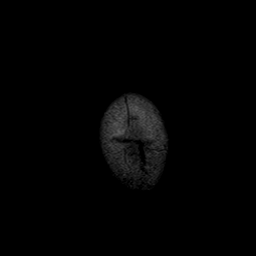

[Series 13: T2 · axial · 5.0mm · 0.45mm/px · z∈[-78,+78]mm · 2 of 26 slices shown (1 of 2)]
[im 1/26]
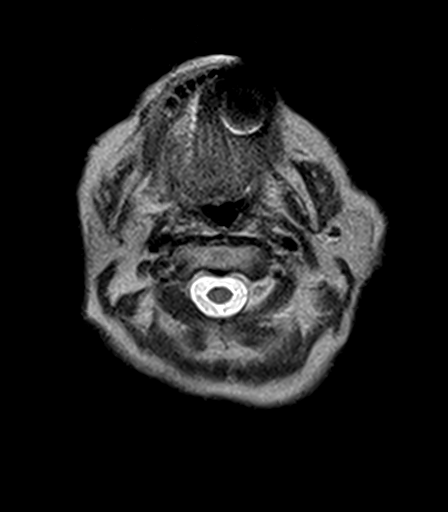
[im 26/26]
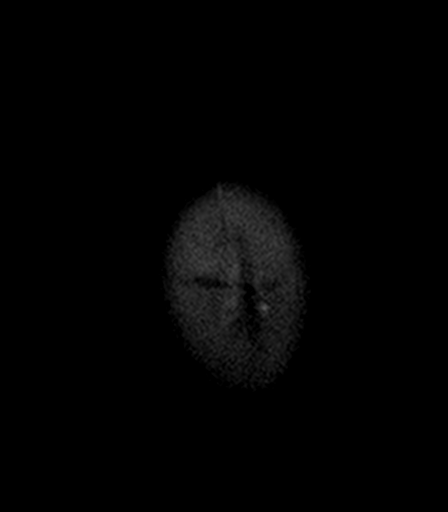

[Series 14: T1 · axial · 5.0mm · 0.90mm/px · z∈[-78,+78]mm · 2 of 27 slices shown (1 of 2)]
[im 1/27]
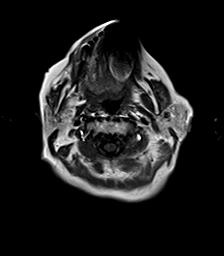
[im 27/27]
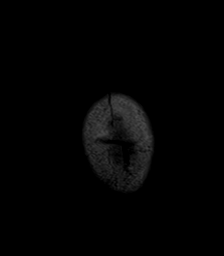

[Series 15: T1 · sagittal · 5.0mm · 0.94mm/px · 2 of 21 slices shown (2 of 2)]
[im 1/21]
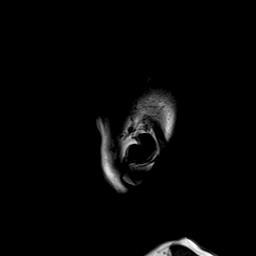
[im 21/21]
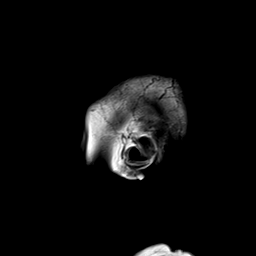

[Series 16: T2 · coronal · 5.0mm · 0.45mm/px · 3 of 31 slices shown (2 of 2)]
[im 1/31]
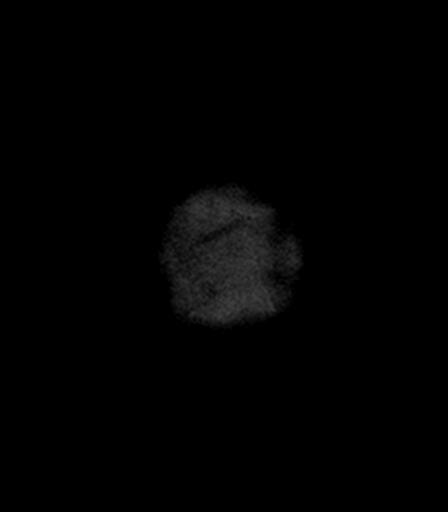
[im 16/31]
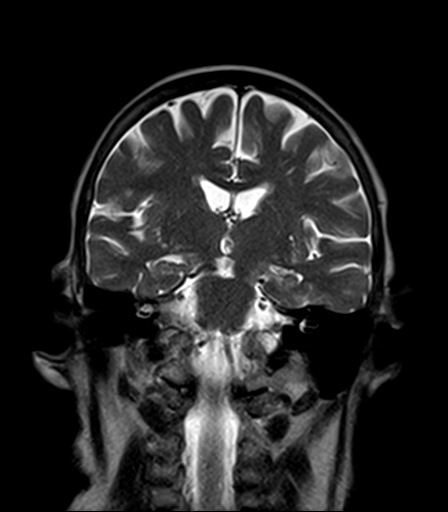
[im 31/31]
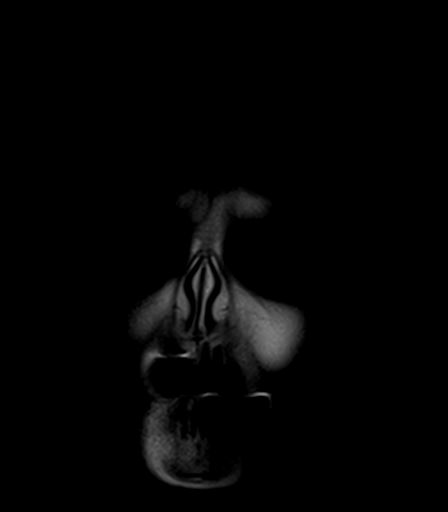

[33 of 48 positions shown; findings below may reference images not displayed]

FINDINGS: MRI HEAD FINDINGS

Brain: Age-appropriate cerebral atrophy. Patchy and confluent
T2/FLAIR hyperintensity within the periventricular deep white matter
both cerebral hemispheres most consistent with chronic microvascular
ischemic disease, mild for age. No abnormal foci of restricted
diffusion to suggest acute or subacute ischemia. Gray-white matter
differentiation maintained. No encephalomalacia to suggest chronic
cortical infarction. No evidence for acute or chronic intracranial
hemorrhage.

No mass lesion, midline shift or mass effect. No hydrocephalus. No
extra-axial fluid collection. Pituitary gland normal.

Vascular: Major intravascular flow voids maintained.

Skull and upper cervical spine: Craniocervical junction normal.
Upper cervical spine within normal limits. Bone marrow signal
intensity normal. No scalp soft tissue abnormality.

Sinuses/Orbits: Patient status post bilateral ocular lens
replacement. Paranasal sinuses are clear. No mastoid effusion. Inner
ear structures normal.

Other: None.

MRA HEAD FINDINGS

ANTERIOR CIRCULATION:

Distal cervical segments of the internal carotid arteries are patent
with antegrade flow. Petrous, cavernous, and supraclinoid segments
patent without hemodynamically significant stenosis. A1 segments
widely patent bilaterally. Patent and normal anterior communicating
artery. Anterior cerebral arteries patent to their distal aspects
without flow-limiting stenosis. M1 segments patent bilaterally.
Normal MCA bifurcations. No proximal M2 occlusion. Distal MCA
branches well perfused and symmetric.

POSTERIOR CIRCULATION:

Vertebral arteries patent to the vertebrobasilar junction without
stenosis. Posterior inferior cerebral arteries patent bilaterally.
Basilar widely patent to its distal aspect. Superior cerebral
arteries patent bilaterally. Both of the posterior cerebral arteries
primarily supplied via the basilar and are well perfused to their
distal aspects.
IMPRESSION: MRI HEAD IMPRESSION:

1. No acute intracranial infarct or other abnormality.
2. Mild chronic microvascular ischemic disease for age.

MRA HEAD IMPRESSION:

Negative intracranial MRA. No large vessel occlusion. No
hemodynamically significant or correctable stenosis.

## 2020-01-29 IMAGING — CT CT HEAD W/O CM
3 series · 15 of 46 positions shown, 18 images · non-contrast
Comparison: None.

CLINICAL DATA: Headache.  Confusion this morning.

EXAM:
CT HEAD WITHOUT CONTRAST
TECHNIQUE: Contiguous axial images were obtained from the base of the skull
through the vertex without intravenous contrast.

[Series 2: head wo · axial · 0.47mm/px · z∈[-121,-1]mm · 9 of 29 slices shown, 12 images]
[im 3/29  brain]
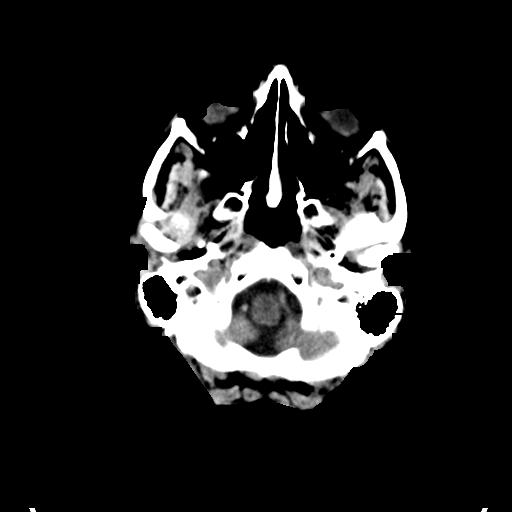
[im 3/29  bone]
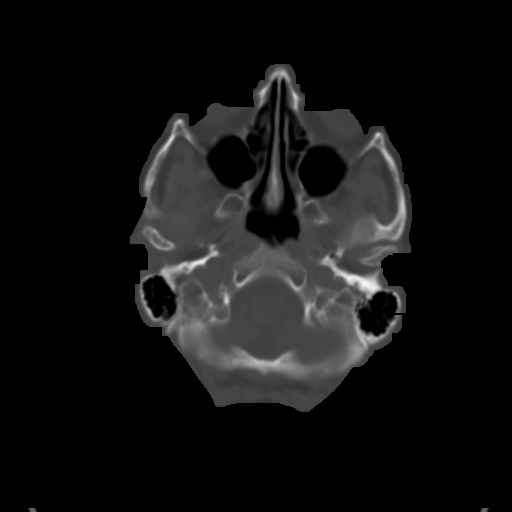
[im 6/29  brain]
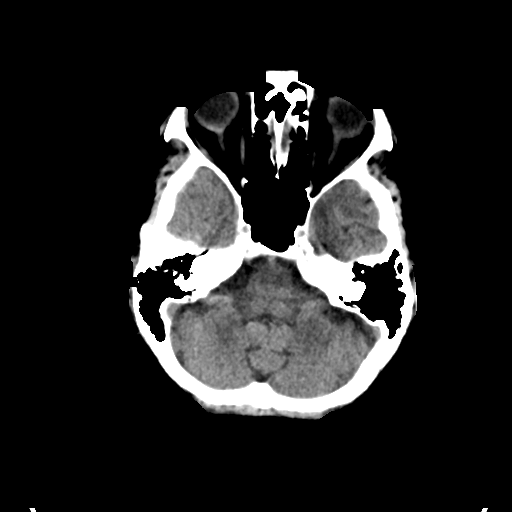
[im 9/29  brain]
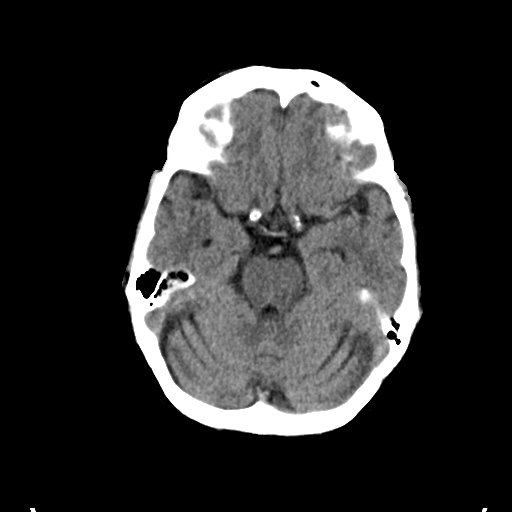
[im 12/29  brain]
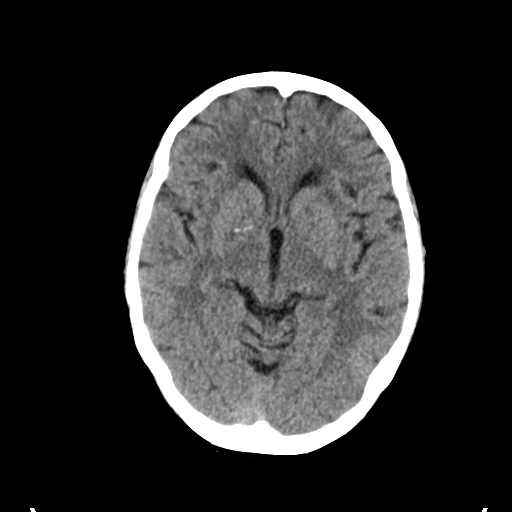
[im 15/29  brain]
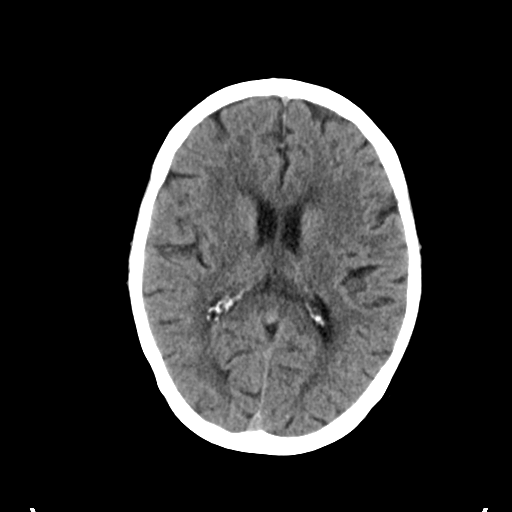
[im 15/29  bone]
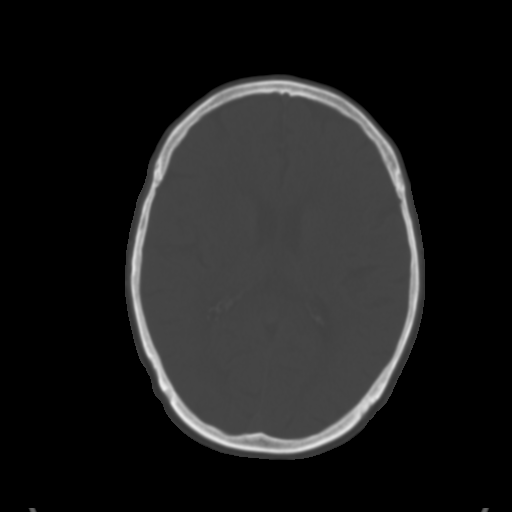
[im 18/29  brain]
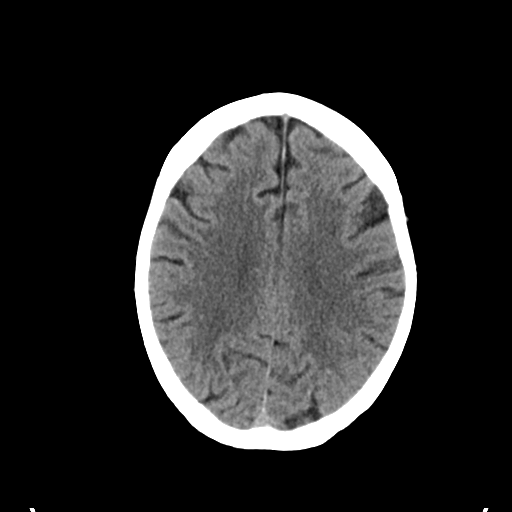
[im 21/29  brain]
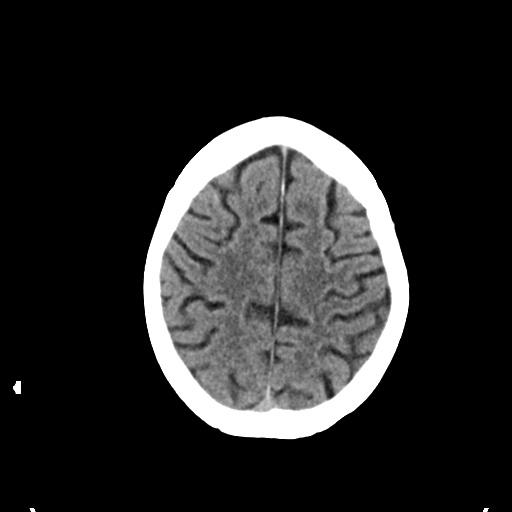
[im 24/29  brain]
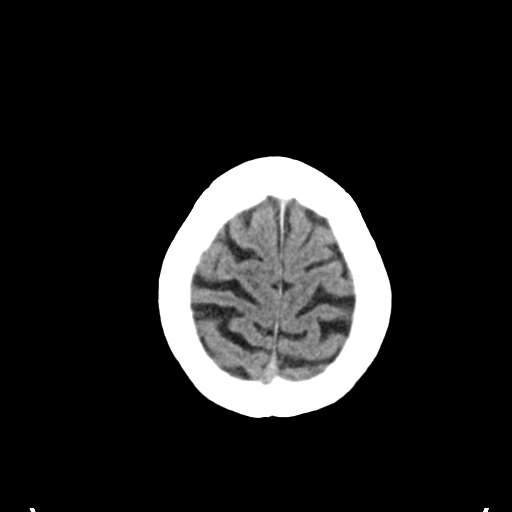
[im 27/29  brain]
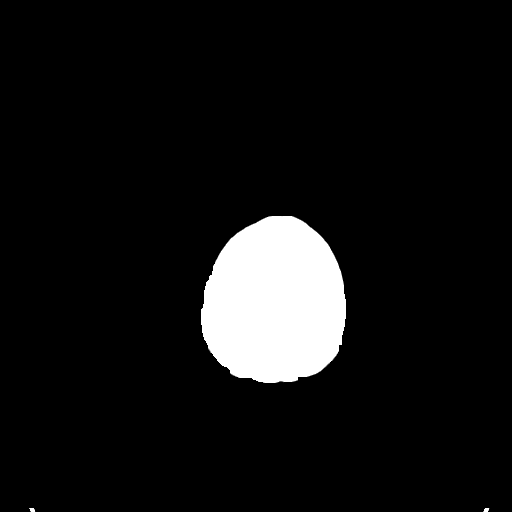
[im 27/29  bone]
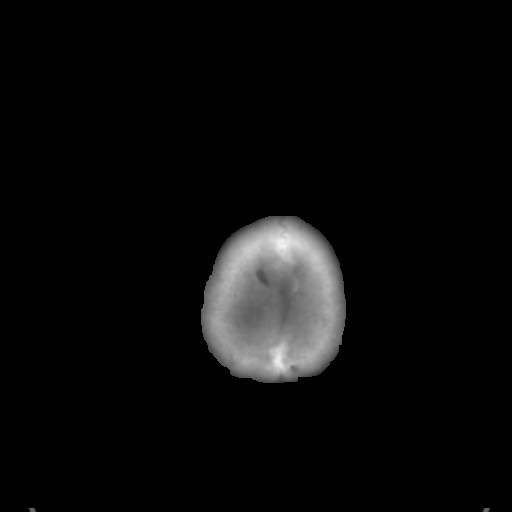

[Series 4: coronal soft tissue · coronal · 0.27mm/px · 3 of 63 slices shown]
[im 21/63  brain]
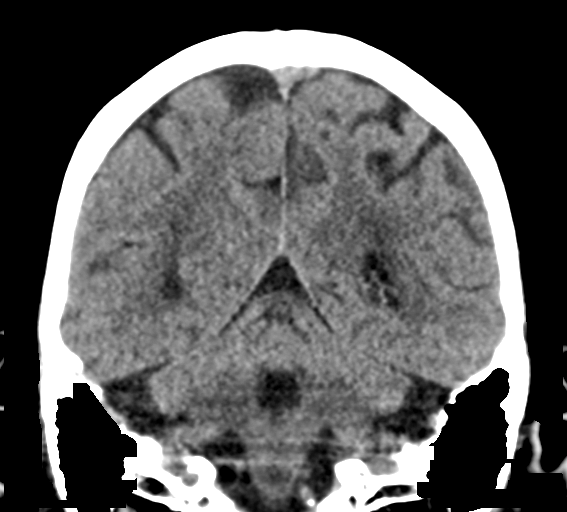
[im 28/63  brain]
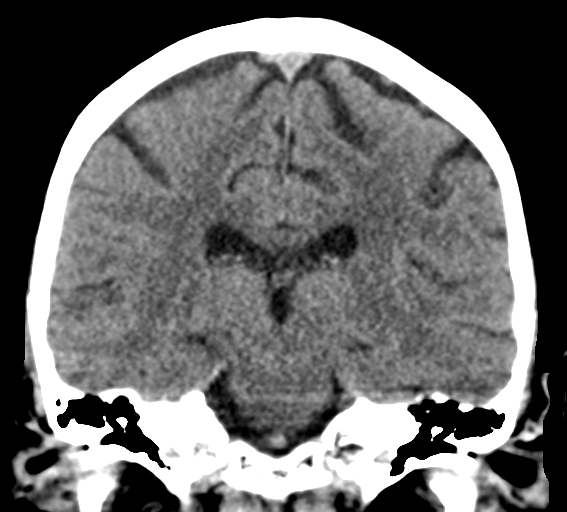
[im 35/63  brain]
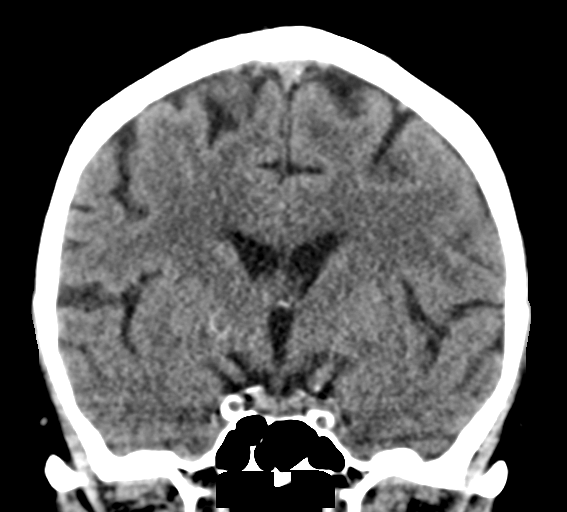

[Series 5: sagittal soft tissue · sagittal · 0.27mm/px · 3 of 51 slices shown]
[im 17/51  brain]
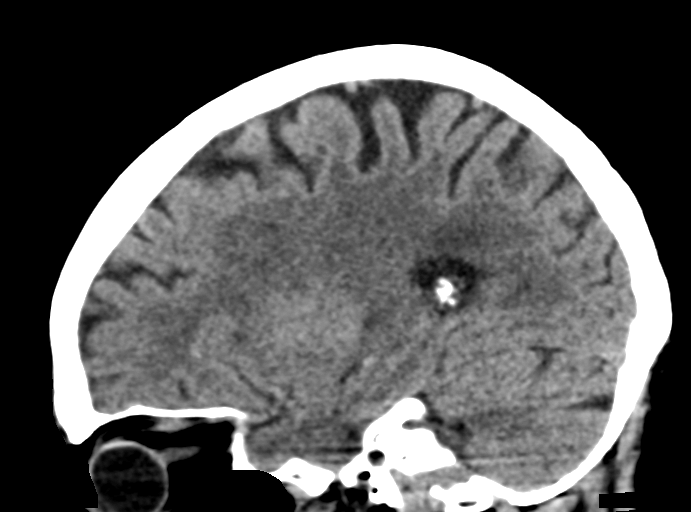
[im 26/51  brain]
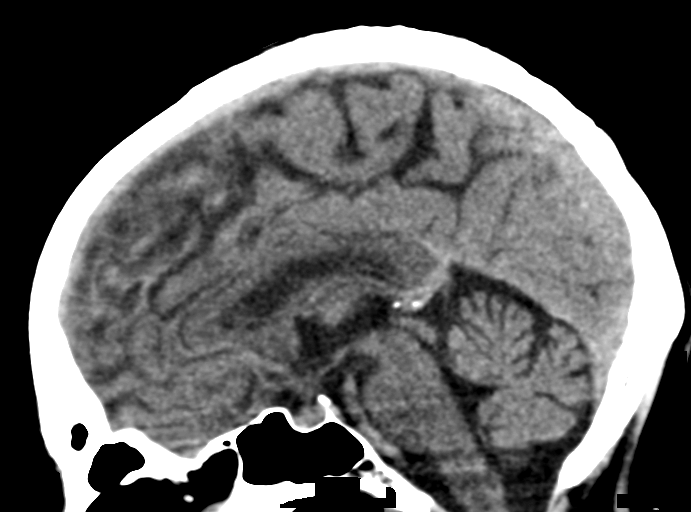
[im 34/51  brain]
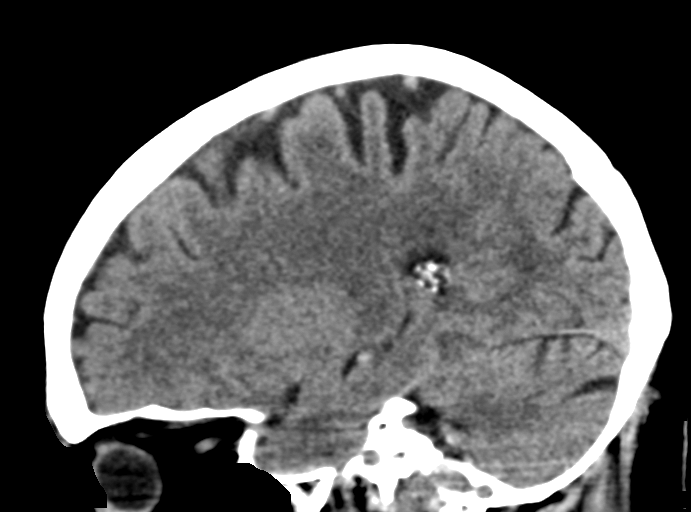

[15 of 46 positions shown; findings below may reference images not displayed]

FINDINGS: Brain: No evidence of acute infarction, hemorrhage, hydrocephalus,
extra-axial collection or mass lesion/mass effect.

Mild periventricular white matter hypoattenuation is noted
consistent with chronic microvascular ischemic change.

Vascular: No hyperdense vessel or unexpected calcification.

Skull: Normal. Negative for fracture or focal lesion.

Sinuses/Orbits: Globes and orbits are unremarkable. Visualized
sinuses and mastoid air cells are clear.

Other: None.
IMPRESSION: 1. No acute intracranial abnormalities.
2. Mild chronic microvascular ischemic change.

## 2020-07-10 DIAGNOSIS — I4891 Unspecified atrial fibrillation: Secondary | ICD-10-CM | POA: Insufficient documentation

## 2020-11-11 LAB — BASIC METABOLIC PANEL
BUN: 32 — AB (ref 4–21)
CO2: 29 — AB (ref 13–22)
Chloride: 108 (ref 99–108)
Creatinine: 1.4 — AB (ref 0.5–1.1)
Glucose: 92
Potassium: 4.8 (ref 3.4–5.3)
Sodium: 142 (ref 137–147)

## 2020-11-11 LAB — CBC AND DIFFERENTIAL
HCT: 40 (ref 36–46)
Hemoglobin: 12.6 (ref 12.0–16.0)
Neutrophils Absolute: 4.8
Platelets: 405 — AB (ref 150–399)
WBC: 7.6

## 2020-11-11 LAB — LIPID PANEL
Cholesterol: 124 (ref 0–200)
HDL: 54 (ref 35–70)
LDL Cholesterol: 52
LDl/HDL Ratio: 2.3
Triglycerides: 87 (ref 40–160)

## 2020-11-11 LAB — HEPATIC FUNCTION PANEL
ALT: 8 (ref 7–35)
AST: 16 (ref 13–35)
Alkaline Phosphatase: 76 (ref 25–125)
Bilirubin, Total: 0.8

## 2020-11-11 LAB — COMPREHENSIVE METABOLIC PANEL
Albumin: 4.1 (ref 3.5–5.0)
Calcium: 9.6 (ref 8.7–10.7)

## 2020-11-11 LAB — CBC: RBC: 4.1 (ref 3.87–5.11)

## 2021-01-23 ENCOUNTER — Ambulatory Visit: Payer: Medicare Other | Admitting: Nurse Practitioner

## 2021-01-23 ENCOUNTER — Encounter: Payer: Self-pay | Admitting: Nurse Practitioner

## 2021-01-23 ENCOUNTER — Other Ambulatory Visit: Payer: Self-pay

## 2021-01-23 VITALS — BP 110/80 | HR 82 | Temp 98.4°F | Ht 59.0 in | Wt 147.5 lb

## 2021-01-23 DIAGNOSIS — L309 Dermatitis, unspecified: Secondary | ICD-10-CM

## 2021-01-23 NOTE — Progress Notes (Signed)
Careteam: Patient Care Team: Derinda Late, MD as PCP - General (Family Medicine)  Advanced Directive information    Allergies  Allergen Reactions  . Azithromycin   . Baclofen   . Iodine     CKD  . Lisinopril   . Sulfa Antibiotics     Chief Complaint  Patient presents with  . Acute Visit    Patient has tick bite that is swelling. Tick bite back of left leg.It was about 8 weeks ago. Patient thinks she got whole tick out. Patient has itching in area.     HPI: Patient is a 87 y.o. female seen in today at the [facility] for evaluation of redness and swelling to back of leg.  Reports she had (what she thought was) a tick that had attached to the back of her leg 8 weeks ago. Reported it was a small spot that she had to brush off. Area was fine after it was removed but then noticed itching occurred. Redness has been there for 6-7 weeks. Has not worsened. No pain. Area slightly raised She has lost track of it. She has been taking care of husband with dementia.  No fevers or chills. Reports she does have pain in her low back and into legs thinks this is due to lifting her husband.  Area is very itchy and scratching frequently  She applied hydrocortisone cream last night which helped the itching.    Review of Systems:  Review of Systems  Constitutional: Negative for chills, fever and malaise/fatigue.  Cardiovascular: Negative for leg swelling.  Skin: Positive for itching and rash.    Past Medical History:  Diagnosis Date  . Hypertension   . Renal disorder   . TIA (transient ischemic attack)    Past Surgical History:  Procedure Laterality Date  . ABDOMINAL HYSTERECTOMY    . BREAST EXCISIONAL BIOPSY Right 36 years ago   neg   Social History:   reports that she has never smoked. She has never used smokeless tobacco. She reports previous alcohol use. She reports that she does not use drugs.  Family History  Adopted: Yes    Medications: Patient's Medications   New Prescriptions   No medications on file  Previous Medications   APIXABAN (ELIQUIS) 5 MG TABS TABLET    Take 5 mg by mouth 2 (two) times daily.   ATENOLOL (TENORMIN) 25 MG TABLET    Take 12.5 mg by mouth every evening.    CHOLECALCIFEROL (VITAMIN D3) 25 MCG (1000 UT) CAPS    Take 1,000 Units by mouth daily.   FLUTICASONE (FLONASE) 50 MCG/ACT NASAL SPRAY    Place 2 sprays into the nose daily.   FUROSEMIDE (LASIX) 20 MG TABLET    Take 20 mg by mouth daily.   LORATADINE (CLARITIN) 10 MG TABLET    Take 10 mg by mouth daily.   OLOPATADINE HCL (PAZEO) 0.7 % SOLN    Place 1 drop into both eyes daily.   VITAMIN B-12 (CYANOCOBALAMIN) 1000 MCG TABLET    Take 1,000 mcg by mouth daily.  Modified Medications   No medications on file  Discontinued Medications   ALLOPURINOL (ZYLOPRIM) 100 MG TABLET    Take 100 mg by mouth daily.   ASPIRIN EC 81 MG TABLET    Take 81 mg by mouth daily.    CLOPIDOGREL (PLAVIX) 75 MG TABLET    Take 75 mg by mouth daily.    Physical Exam:  Vitals:   01/23/21 1447  BP: 110/80  Pulse: 82  Temp: 98.4 F (36.9 C)  TempSrc: Temporal  SpO2: 95%  Weight: 147 lb 8 oz (66.9 kg)  Height: '4\' 11"'$  (1.499 m)   Body mass index is 29.79 kg/m. Wt Readings from Last 3 Encounters:  01/23/21 147 lb 8 oz (66.9 kg)  07/16/19 164 lb 3.9 oz (74.5 kg)  08/19/18 145 lb (65.8 kg)    Physical Exam Constitutional:      Appearance: Normal appearance.  HENT:     Head: Normocephalic and atraumatic.  Skin:    General: Skin is warm and dry.     Findings: Rash present.     Comments: erythematous papule noted to left calf with areas of excoriation.    Neurological:     Mental Status: She is alert.     Labs reviewed: Basic Metabolic Panel: No results for input(s): NA, K, CL, CO2, GLUCOSE, BUN, CREATININE, CALCIUM, MG, PHOS, TSH in the last 8760 hours. Liver Function Tests: No results for input(s): AST, ALT, ALKPHOS, BILITOT, PROT, ALBUMIN in the last 8760 hours. No results for  input(s): LIPASE, AMYLASE in the last 8760 hours. No results for input(s): AMMONIA in the last 8760 hours. CBC: No results for input(s): WBC, NEUTROABS, HGB, HCT, MCV, PLT in the last 8760 hours. Lipid Panel: No results for input(s): CHOL, HDL, LDLCALC, TRIG, CHOLHDL, LDLDIRECT in the last 8760 hours. TSH: No results for input(s): TSH in the last 8760 hours. A1C: Lab Results  Component Value Date   HGBA1C 5.4 08/20/2018     Assessment/Plan 1. Dermatitis Consistent with atopic dermatitis. She reports improvement in itching with OTC hydrocortisone cream. Instructed to continue to use 2-3 times daily. To follow up if area worsens or fails to improve.   Carlos American. [place], [facility] [phone]

## 2021-02-05 ENCOUNTER — Emergency Department
Admission: EM | Admit: 2021-02-05 | Discharge: 2021-02-05 | Disposition: A | Discharge status: Home / Self Care | Payer: Medicare Other | Attending: Emergency Medicine | Admitting: Emergency Medicine

## 2021-02-05 ENCOUNTER — Encounter: Payer: Self-pay | Admitting: *Deleted

## 2021-02-05 ENCOUNTER — Other Ambulatory Visit: Payer: Self-pay

## 2021-02-05 ENCOUNTER — Emergency Department: Payer: Medicare Other

## 2021-02-05 DIAGNOSIS — I2510 Atherosclerotic heart disease of native coronary artery without angina pectoris: Secondary | ICD-10-CM | POA: Insufficient documentation

## 2021-02-05 DIAGNOSIS — I130 Hypertensive heart and chronic kidney disease with heart failure and stage 1 through stage 4 chronic kidney disease, or unspecified chronic kidney disease: Secondary | ICD-10-CM | POA: Diagnosis not present

## 2021-02-05 DIAGNOSIS — N183 Chronic kidney disease, stage 3 unspecified: Secondary | ICD-10-CM | POA: Diagnosis not present

## 2021-02-05 DIAGNOSIS — Y9301 Activity, walking, marching and hiking: Secondary | ICD-10-CM | POA: Insufficient documentation

## 2021-02-05 DIAGNOSIS — Z7901 Long term (current) use of anticoagulants: Secondary | ICD-10-CM | POA: Insufficient documentation

## 2021-02-05 DIAGNOSIS — I5023 Acute on chronic systolic (congestive) heart failure: Secondary | ICD-10-CM | POA: Diagnosis not present

## 2021-02-05 DIAGNOSIS — W01198A Fall on same level from slipping, tripping and stumbling with subsequent striking against other object, initial encounter: Secondary | ICD-10-CM | POA: Diagnosis not present

## 2021-02-05 DIAGNOSIS — S52601A Unspecified fracture of lower end of right ulna, initial encounter for closed fracture: Secondary | ICD-10-CM | POA: Diagnosis not present

## 2021-02-05 DIAGNOSIS — S52551A Other extraarticular fracture of lower end of right radius, initial encounter for closed fracture: Secondary | ICD-10-CM | POA: Diagnosis not present

## 2021-02-05 DIAGNOSIS — S52691A Other fracture of lower end of right ulna, initial encounter for closed fracture: Secondary | ICD-10-CM

## 2021-02-05 DIAGNOSIS — Z79899 Other long term (current) drug therapy: Secondary | ICD-10-CM | POA: Diagnosis not present

## 2021-02-05 DIAGNOSIS — S6991XA Unspecified injury of right wrist, hand and finger(s), initial encounter: Secondary | ICD-10-CM | POA: Diagnosis present

## 2021-02-05 MED ORDER — ACETAMINOPHEN 500 MG PO TABS
1000.0000 mg | ORAL_TABLET | Freq: Once | ORAL | Status: AC
  Administered 2021-02-05: 1000 mg via ORAL
  Filled 2021-02-05: qty 2

## 2021-02-05 NOTE — ED Provider Notes (Signed)
[facility] EMERGENCY DEPARTMENT Provider Note   CSN: [id number] Arrival date & time: 02/05/21  2100     History Chief Complaint  Patient presents with   Wrist Pain    Brittney [name] is a 87 y.o. female presents to the emergency department for evaluation of right wrist pain.  Patient states she was walking just prior to arrival, tripped over her shoe and fell onto her right outstretched hand on the coffee table.  She hyperextended the wrist and felt pain along the dorsal aspect of the wrist.  She denies any numbness or tingling.  No other injury to her body.  She denies any head injury, LOC, nausea vomiting or neck pain.  No numbness tingling or radicular symptoms in the upper or lower extremities.  She has been ambulatory.  Pain is moderate she has not any medications for pain except for a muscle relaxer 6 hours ago.  HPI     Past Medical History:  Diagnosis Date   Hypertension    Renal disorder    TIA (transient ischemic attack)     Patient Active Problem List   Diagnosis Date Noted   Acute systolic CHF (congestive heart failure) ([place]) [id number]   Chronic systolic CHF (congestive heart failure) ([place]) 07/15/2019   CKD (chronic kidney disease) stage 3, GFR 30-59 ml/min ([place]) 07/14/2019   Essential hypertension 07/14/2019   Coronary artery disease 07/14/2019   Gout 07/14/2019   Acute pancreatitis 07/14/2019   TIA (transient ischemic attack) 08/19/2018    Past Surgical History:  Procedure Laterality Date   ABDOMINAL HYSTERECTOMY     BREAST EXCISIONAL BIOPSY Right 36 years ago   neg     OB History   No obstetric history on file.     Family History  Adopted: Yes    Social History   Tobacco Use   Smoking status: Never   Smokeless tobacco: Never  Substance Use Topics   Alcohol use: Not Currently   Drug use: Never    Home Medications Prior to Admission medications   Medication Sig Start Date End Date Taking? Authorizing Provider   apixaban (ELIQUIS) 5 MG TABS tablet Take 5 mg by mouth 2 (two) times daily.    [provider]  atenolol (TENORMIN) 25 MG tablet Take 12.5 mg by mouth every evening.     [provider]  Cholecalciferol (VITAMIN D3) 25 MCG (1000 UT) CAPS Take 1,000 Units by mouth daily.    [provider]  fluticasone (FLONASE) 50 MCG/ACT nasal spray Place 2 sprays into the nose daily.    [provider]  furosemide (LASIX) 20 MG tablet Take 20 mg by mouth daily. 03/04/18   [provider]  loratadine (CLARITIN) 10 MG tablet Take 10 mg by mouth daily.    [provider]  Olopatadine HCl (PAZEO) 0.7 % SOLN Place 1 drop into both eyes daily.    [provider]  vitamin B-12 (CYANOCOBALAMIN) 1000 MCG tablet Take 1,000 mcg by mouth daily.    [provider]    Allergies    Azithromycin, Baclofen, Iodine, Lisinopril, and Sulfa antibiotics  Review of Systems   Review of Systems  Constitutional:  Negative for chills and fever.  Respiratory:  Negative for shortness of breath.   Cardiovascular:  Negative for chest pain.  Gastrointestinal:  Negative for abdominal pain, nausea and vomiting.  Musculoskeletal:  Positive for arthralgias and joint swelling. Negative for myalgias, neck pain and neck stiffness.  Skin:  Negative for  rash and wound.  Neurological:  Negative for dizziness, light-headedness, numbness and headaches.   Physical Exam Updated Vital Signs BP (!) 149/112   Pulse 100   Temp 98.4 F (36.9 C) (Oral)   Resp 18   Ht '4\' 11"'$  (1.499 m)   Wt 65.8 kg   BMI 29.29 kg/m   Physical Exam Constitutional:      Appearance: She is well-developed.  HENT:     Head: Normocephalic and atraumatic.     Right Ear: External ear normal.     Left Ear: External ear normal.     Nose: Nose normal.  Eyes:     Conjunctiva/sclera: Conjunctivae normal.     Pupils: Pupils are equal, round, and reactive to light.  Cardiovascular:     Rate and  Rhythm: Normal rate.  Pulmonary:     Effort: Pulmonary effort is normal. No respiratory distress.     Breath sounds: Normal breath sounds.  Abdominal:     Palpations: Abdomen is soft.     Tenderness: There is no abdominal tenderness.  Musculoskeletal:        General: No deformity. Normal range of motion.     Cervical back: Normal range of motion.     Comments: Right wrist with mild soft tissue swelling along the dorsal aspect of the wrist with no significant deformity.  She has full composite fist.  Mild arthritic changes throughout the right ring PIP joint.  She has no tendon deficits, skin breakdown noted.  She is tender along the distal radial metaphysis with no ulnar styloid tenderness.  No tenderness throughout the mid to proximal forearm, elbow, shoulder, clavicle, cervical spine.  Both hips and knees move well with no discomfort.  Skin:    General: Skin is warm and dry.     Findings: No rash.  Neurological:     Mental Status: She is alert and oriented to person, place, and time.     Cranial Nerves: No cranial nerve deficit.     Coordination: Coordination normal.  Psychiatric:        Behavior: Behavior normal.    ED Results / Procedures / Treatments   Labs (all labs ordered are listed, but only abnormal results are displayed) Labs Reviewed - No data to display  EKG None  Radiology DG Wrist Complete Right  Result Date: 02/05/2021 CLINICAL DATA:  Wrist pain, fall EXAM: RIGHT WRIST - COMPLETE 3+ VIEW COMPARISON:  None. FINDINGS: Comminuted fractures noted through the distal right radius and ulna. Mild posterior displacement and angulation. No subluxation or dislocation. Diffuse soft tissue swelling. IMPRESSION: Comminuted mildly displaced and angulated distal right radial and ulnar fractures. Electronically Signed   By: Rolm Baptise M.D.   On: 02/05/2021 21:33    Procedures .Ortho Injury Treatment  Date/Time: 02/05/2021 10:25 PM Performed by: Duanne Guess,  PA-C Authorized by: Duanne Guess, PA-C   Consent:    Consent obtained:  Verbal   Consent given by:  Patient   Risks discussed:  FractureInjury location: wrist Location details: right wrist Injury type: fracture Fracture type: distal radius and distal radius and ulnar styloid Pre-procedure neurovascular assessment: neurovascularly intact Pre-procedure distal perfusion: normal Pre-procedure neurological function: normal Pre-procedure range of motion: reduced Manipulation performed: no Immobilization: splint Splint type: sugar tong Splint Applied by: ED Provider Supplies used: cotton padding, elastic bandage and Ortho-Glass Post-procedure neurovascular assessment: post-procedure neurovascularly intact Post-procedure distal perfusion: normal Post-procedure neurological function: normal Post-procedure range of motion: unchanged     Medications Ordered  in ED Medications  acetaminophen (TYLENOL) tablet 1,000 mg (1,000 mg Oral Given 02/05/21 2122)    ED Course  I have reviewed the triage vital signs and the nursing notes.  Pertinent labs & imaging results that were available during my care of the patient were reviewed by me and considered in my medical decision making (see chart for details).    MDM Rules/Calculators/A&P                          87 year old female with slightly impacted and dorsally displaced distal radial metaphyseal fracture with nondisplaced distal ulna fracture.  Patient is neurovascular intact.  She is right-hand dominant.  Vital signs are stable.  No other injury to her body during the accident.  Placed into a sugar-tong splint and sling.  She will call orthopedic office tomorrow to schedule follow-up appointment. Final Clinical Impression(s) / ED Diagnoses Final diagnoses:  Other closed extra-articular fracture of distal end of right radius, initial encounter  Other closed fracture of distal end of right ulna, initial encounter    Rx / DC Orders ED  Discharge Orders     None        Renata Caprice 02/05/21 2226    Vladimir Crofts, MD 02/05/21 2322

## 2021-02-05 NOTE — ED Triage Notes (Signed)
Pt tripped and fell and hit right hand/wrist on a coffee table.  Swelling noted.  Pt alert

## 2021-02-05 NOTE — Discharge Instructions (Addendum)
Please use sling as needed for comfort.  Keep splint clean and dry at all times.  Do not remove splint until you follow-up with orthopedics.  Take Tylenol every 6 hours as needed for pain.  Call orthopedic office tomorrow to to schedule follow-up appointment.

## 2021-02-18 ENCOUNTER — Other Ambulatory Visit: Payer: Self-pay

## 2021-02-18 ENCOUNTER — Ambulatory Visit: Payer: Medicare Other | Admitting: Nurse Practitioner

## 2021-02-18 ENCOUNTER — Encounter: Payer: Self-pay | Admitting: Nurse Practitioner

## 2021-02-18 VITALS — BP 118/80 | HR 76 | Temp 98.3°F | Ht 59.0 in | Wt 146.5 lb

## 2021-02-18 DIAGNOSIS — L03116 Cellulitis of left lower limb: Secondary | ICD-10-CM

## 2021-02-18 DIAGNOSIS — L259 Unspecified contact dermatitis, unspecified cause: Secondary | ICD-10-CM

## 2021-02-18 MED ORDER — DOXYCYCLINE HYCLATE 100 MG PO TABS: 100.0000 mg | ORAL_TABLET | Freq: Two times a day (BID) | ORAL | 0 refills | Status: DC

## 2021-02-18 NOTE — Patient Instructions (Addendum)
  Doxycycline 100 mg by mouth twice daily for 7 days   to start probiotic twice daily with antibiotic- take twice daily- lactulose free    Can use some hydrocortisone cream to hairline as needed for itching.   Stop treatment shampoo.

## 2021-02-18 NOTE — Progress Notes (Signed)
Careteam: Patient Care Team: Derinda Late, MD as PCP - General (Family Medicine)  Advanced Directive information    Allergies  Allergen Reactions   Azithromycin    Baclofen    Iodine     CKD   Lisinopril    Sulfa Antibiotics     Chief Complaint  Patient presents with   Acute Visit    Area left lower leg that is red and warm to touch. Patient had tick bite in area about 2 months ago. Some swelling. Patient would also like to discuss itching along back of hairline.     HPI: Patient is a 87 y.o. female seen in today at the [facility] for to follow up rash on back of left leg. Reports area gets really itchy and is bumpy. She has been itching area but tires not to. Has been applying ice.  Area slightly enlarged over the last few days and now warm.  No fever or chills.   Increase itching to back of her head/hairline. She has been using some blue treatment so hair does not turn yellow. Reports her hairline has been itchy for sometime but now is getting worse.    Review of Systems:  Review of Systems  Constitutional:  Negative for fever.  Gastrointestinal:  Negative for diarrhea.  Skin:  Positive for itching and rash.   Past Medical History:  Diagnosis Date   Hypertension    Renal disorder    TIA (transient ischemic attack)    Past Surgical History:  Procedure Laterality Date   ABDOMINAL HYSTERECTOMY     BREAST EXCISIONAL BIOPSY Right 36 years ago   neg   Social History:   reports that she has never smoked. She has never used smokeless tobacco. She reports previous alcohol use. She reports that she does not use drugs.  Family History  Adopted: Yes    Medications: Patient's Medications  New Prescriptions   DOXYCYCLINE (VIBRA-TABS) 100 MG TABLET    Take 1 tablet (100 mg total) by mouth 2 (two) times daily.  Previous Medications   APIXABAN (ELIQUIS) 5 MG TABS TABLET    Take 5 mg by mouth 2 (two) times daily.   ATENOLOL (TENORMIN) 25 MG TABLET    Take  12.5 mg by mouth every evening.    CHOLECALCIFEROL (VITAMIN D3) 25 MCG (1000 UT) CAPS    Take 1,000 Units by mouth daily.   FLUTICASONE (FLONASE) 50 MCG/ACT NASAL SPRAY    Place 2 sprays into the nose daily.   FUROSEMIDE (LASIX) 20 MG TABLET    Take 20 mg by mouth daily.   LORATADINE (CLARITIN) 10 MG TABLET    Take 10 mg by mouth daily.   OLOPATADINE HCL (PAZEO) 0.7 % SOLN    Place 1 drop into both eyes daily as needed.   VITAMIN B-12 (CYANOCOBALAMIN) 1000 MCG TABLET    Take 1,000 mcg by mouth daily.  Modified Medications   No medications on file  Discontinued Medications   No medications on file    Physical Exam:  Vitals:   02/18/21 1102  BP: 118/80  Pulse: 76  Temp: 98.3 F (36.8 C)  TempSrc: Oral  SpO2: 97%  Weight: 146 lb 8 oz (66.5 kg)  Height: '4\' 11"'$  (1.499 m)   Body mass index is 29.59 kg/m. Wt Readings from Last 3 Encounters:  02/18/21 146 lb 8 oz (66.5 kg)  02/05/21 145 lb (65.8 kg)  01/23/21 147 lb 8 oz (66.9 kg)  Physical Exam Constitutional:      General: She is not in acute distress.    Appearance: She is well-developed. She is not diaphoretic.  HENT:     Head: Normocephalic and atraumatic.     Mouth/Throat:     Pharynx: No oropharyngeal exudate.  Eyes:     Conjunctiva/sclera: Conjunctivae normal.     Pupils: Pupils are equal, round, and reactive to light.  Abdominal:     General: Bowel sounds are normal.     Palpations: Abdomen is soft.  Musculoskeletal:     Cervical back: Normal range of motion and neck supple.     Right lower leg: No edema.     Left lower leg: No edema.  Skin:    General: Skin is warm and dry.     Findings: Erythema present.     Comments: Erythema noted to hairline  Erythema with warm raised area noted to left lower leg.   Neurological:     Mental Status: She is alert.  Psychiatric:        Mood and Affect: Mood normal.    Labs reviewed: Basic Metabolic Panel: No results for input(s): NA, K, CL, CO2, GLUCOSE, BUN,  CREATININE, CALCIUM, MG, PHOS, TSH in the last 8760 hours. Liver Function Tests: No results for input(s): AST, ALT, ALKPHOS, BILITOT, PROT, ALBUMIN in the last 8760 hours. No results for input(s): LIPASE, AMYLASE in the last 8760 hours. No results for input(s): AMMONIA in the last 8760 hours. CBC: No results for input(s): WBC, NEUTROABS, HGB, HCT, MCV, PLT in the last 8760 hours. Lipid Panel: No results for input(s): CHOL, HDL, LDLCALC, TRIG, CHOLHDL, LDLDIRECT in the last 8760 hours. TSH: No results for input(s): TSH in the last 8760 hours. A1C: Lab Results  Component Value Date   HGBA1C 5.4 08/20/2018     Assessment/Plan 1. Cellulitis of left lower extremity -redness and warmth now noted to lower leg. Educated not to scratch area. - doxycycline (VIBRA-TABS) 100 MG tablet; Take 1 tablet (100 mg total) by mouth 2 (two) times daily.  Dispense: 14 tablet; Refill: 0 -to use probiotic while on antibiotic   2. Contact dermatitis and eczema -on scalp. She reports head has been itching for quite sometime. No flaking noted. Suspect contact dermatitis from shampoo. To stop shampoo, can use hydrocortisone cream as needed for itching at this time. To notify if symptoms worsen.    Carlos American. [place], [facility] [phone]

## 2021-02-20 ENCOUNTER — Other Ambulatory Visit: Payer: Self-pay

## 2021-02-20 ENCOUNTER — Ambulatory Visit: Payer: Medicare Other | Admitting: Nurse Practitioner

## 2021-02-20 VITALS — BP 103/70 | HR 84 | Temp 98.3°F | Ht 59.0 in | Wt 146.0 lb

## 2021-02-20 DIAGNOSIS — W57XXXD Bitten or stung by nonvenomous insect and other nonvenomous arthropods, subsequent encounter: Secondary | ICD-10-CM

## 2021-02-20 DIAGNOSIS — S80862D Insect bite (nonvenomous), left lower leg, subsequent encounter: Secondary | ICD-10-CM | POA: Diagnosis not present

## 2021-02-20 DIAGNOSIS — L03116 Cellulitis of left lower limb: Secondary | ICD-10-CM | POA: Diagnosis not present

## 2021-02-20 DIAGNOSIS — L299 Pruritus, unspecified: Secondary | ICD-10-CM | POA: Diagnosis not present

## 2021-02-20 NOTE — Progress Notes (Signed)
Careteam: Patient Care Team: Derinda Late, MD as PCP - General (Family Medicine)  Advanced Directive information    Allergies  Allergen Reactions   Azithromycin    Baclofen    Iodine     CKD   Lisinopril    Sulfa Antibiotics     Chief Complaint  Patient presents with   Acute Visit    Patient has rash and itching on torso and upper chest area. Patient would like to know if it is the antibiotic making her itch. Patient still concerned about area on lower left leg.Patient states she is having pain behind right knee.      HPI: Patient is a 87 y.o. female seen in today at the [facility] for rash on her chest. She reports it started right before she went to bed which was right after she took dose of doxycyline. She took another dose this morning but that did not seem to make the rash worse.  She thought she saw a rash this morning- none now but she is becoming more itching.  She has had reactions to antibiotics in the past so concerned over this.   She is having increase in pain to right knee, no injury or fall.  Reports a lot of stress.  She seeing a back doctor this week- never had any issues until she was picking her husband up and down from the chair.   No other joint pain. 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   Does not wish to be on any additional medication.     Review of Systems:  Review of Systems  Constitutional:  Positive for malaise/fatigue. Negative for chills and fever.  Musculoskeletal:  Positive for joint pain (to left knee).  Skin:  Positive for itching and rash (to lower leg).   Past Medical History:  Diagnosis Date   Hypertension    Renal disorder    TIA (transient ischemic attack)    Past Surgical History:  Procedure Laterality Date   ABDOMINAL HYSTERECTOMY     BREAST EXCISIONAL BIOPSY Right 36 years ago   neg   Social History:   reports that she has never smoked. She has never used smokeless tobacco. She reports previous alcohol  use. She reports that she does not use drugs.  Family History  Adopted: Yes    Medications: Patient's Medications  New Prescriptions   No medications on file  Previous Medications   APIXABAN (ELIQUIS) 5 MG TABS TABLET    Take 5 mg by mouth 2 (two) times daily.   ATENOLOL (TENORMIN) 25 MG TABLET    Take 12.5 mg by mouth every evening.    CHOLECALCIFEROL (VITAMIN D3) 25 MCG (1000 UT) CAPS    Take 1,000 Units by mouth daily.   DOXYCYCLINE (VIBRA-TABS) 100 MG TABLET    Take 1 tablet (100 mg total) by mouth 2 (two) times daily.   FLUTICASONE (FLONASE) 50 MCG/ACT NASAL SPRAY    Place 2 sprays into the nose daily.   FUROSEMIDE (LASIX) 20 MG TABLET    Take 20 mg by mouth daily.   LORATADINE (CLARITIN) 10 MG TABLET    Take 10 mg by mouth daily.   OLOPATADINE HCL (PAZEO) 0.7 % SOLN    Place 1 drop into both eyes daily as needed.   VITAMIN B-12 (CYANOCOBALAMIN) 1000 MCG TABLET    Take 1,000 mcg by mouth daily.  Modified Medications   No medications on file  Discontinued Medications   No medications  on file    Physical Exam:  Vitals:   02/20/21 1327  BP: 103/70  Pulse: 84  Temp: 98.3 F (36.8 C)  TempSrc: Oral  SpO2: 97%  Weight: 146 lb (66.2 kg)  Height: '4\' 11"'$  (1.499 m)   Body mass index is 29.49 kg/m. Wt Readings from Last 3 Encounters:  02/20/21 146 lb (66.2 kg)  02/18/21 146 lb 8 oz (66.5 kg)  02/05/21 145 lb (65.8 kg)    Physical Exam Constitutional:      General: She is not in acute distress.    Appearance: She is well-developed. She is not diaphoretic.  HENT:     Head: Normocephalic and atraumatic.     Mouth/Throat:     Pharynx: No oropharyngeal exudate.  Eyes:     Conjunctiva/sclera: Conjunctivae normal.     Pupils: Pupils are equal, round, and reactive to light.  Pulmonary:     Effort: Pulmonary effort is normal.     Breath sounds: Normal breath sounds.  Abdominal:     General: Bowel sounds are normal.     Palpations: Abdomen is soft.  Musculoskeletal:      Cervical back: Normal range of motion and neck supple.     Right lower leg: No edema.     Left lower leg: No edema.  Skin:    General: Skin is warm and dry.     Findings: Rash (to left leg appears unchanged in size, no longer hot to touch, no drainage or tenderness.) present.     Comments: No rash to chest or back.   Neurological:     Mental Status: She is alert.  Psychiatric:        Mood and Affect: Mood normal.    Labs reviewed: Basic Metabolic Panel: No results for input(s): NA, K, CL, CO2, GLUCOSE, BUN, CREATININE, CALCIUM, MG, PHOS, TSH in the last 8760 hours. Liver Function Tests: No results for input(s): AST, ALT, ALKPHOS, BILITOT, PROT, ALBUMIN in the last 8760 hours. No results for input(s): LIPASE, AMYLASE in the last 8760 hours. No results for input(s): AMMONIA in the last 8760 hours. CBC: No results for input(s): WBC, NEUTROABS, HGB, HCT, MCV, PLT in the last 8760 hours. Lipid Panel: No results for input(s): CHOL, HDL, LDLCALC, TRIG, CHOLHDL, LDLDIRECT in the last 8760 hours. TSH: No results for input(s): TSH in the last 8760 hours. A1C: Lab Results  Component Value Date   HGBA1C 5.4 08/20/2018     Assessment/Plan 1. Pruritus Worse last night, not as bad today. Currently without rash. Will have her continue to monitor at this time.  2. Tick bite of left lower leg, subsequent encounter -concern of lymes disease. She does have increase fatigue which could be related to stress however would like to rule out lymes disease, will get igg and igm blood work. Also will get CBC  3. Cellulitis of left lower extremity Improved heat to area. To continue to monitor and make sure redness improves and does not spread. Will continue on doxycycline for now but if pruritus worsens or rash on body occurs to notify.   Carlos American. [place], [facility] [phone]

## 2021-03-03 DIAGNOSIS — S52501A Unspecified fracture of the lower end of right radius, initial encounter for closed fracture: Secondary | ICD-10-CM | POA: Insufficient documentation

## 2021-03-04 ENCOUNTER — Encounter: Payer: Self-pay | Admitting: Nurse Practitioner

## 2021-03-04 ENCOUNTER — Other Ambulatory Visit: Payer: Self-pay

## 2021-03-04 ENCOUNTER — Ambulatory Visit: Payer: Medicare Other | Admitting: Nurse Practitioner

## 2021-03-04 VITALS — BP 128/78 | HR 85 | Resp 18 | Ht 60.0 in | Wt 147.0 lb

## 2021-03-04 DIAGNOSIS — I10 Essential (primary) hypertension: Secondary | ICD-10-CM

## 2021-03-04 DIAGNOSIS — E538 Deficiency of other specified B group vitamins: Secondary | ICD-10-CM | POA: Diagnosis not present

## 2021-03-04 DIAGNOSIS — J302 Other seasonal allergic rhinitis: Secondary | ICD-10-CM

## 2021-03-04 DIAGNOSIS — M48062 Spinal stenosis, lumbar region with neurogenic claudication: Secondary | ICD-10-CM

## 2021-03-04 DIAGNOSIS — E782 Mixed hyperlipidemia: Secondary | ICD-10-CM

## 2021-03-04 DIAGNOSIS — L03116 Cellulitis of left lower limb: Secondary | ICD-10-CM | POA: Diagnosis not present

## 2021-03-04 DIAGNOSIS — N1831 Chronic kidney disease, stage 3a: Secondary | ICD-10-CM | POA: Diagnosis not present

## 2021-03-04 DIAGNOSIS — I4821 Permanent atrial fibrillation: Secondary | ICD-10-CM

## 2021-03-04 NOTE — Progress Notes (Signed)
Careteam: Patient Care Team: Lauree Chandler, NP as PCP - General (Geriatric Medicine)  Advanced Directive information Does Patient Have a Medical Advance Directive?: No, Would patient like information on creating a medical advance directive?: No - Patient declined  Allergies  Allergen Reactions   Lisinopril Swelling   Azithromycin Diarrhea   Baclofen Diarrhea   Iodine     CKD   Sulfa Antibiotics Rash    Chief Complaint  Patient presents with   Establish Care    Patient presents to establish care.     HPI: Patient is a 87 y.o. female seen in today at the [facility] to establish care.  Husband died since she was here last.  Last AWV march 28   A fib- on eliquis for anticoagulation. Has metoprolol and atenolol 12.5 mg on medication list. Unsure which she is taking.  Vit d def- on supplement.   Vit b12 def- on b12 supplement.   Seasonal allergies- on fluticasone allergy spray PRN and Claritin 10 mg daily as needed- taking now.   Bilateral LE edema- has lasix 20 mg on file but not using.   Hyperlipidemia- on crestor 5 mg daily   CKD -last labs in march  Chronic back pain with sciatica- could not tolerate muscle relaxer or gabapenin- had to lift her husband a lot prior to his passing. Ongoing pain. Has prescription for PT that she is planning on starting.   Had blood work last week.   Leg is better after doxycycline.   Review of Systems:  Review of Systems  Constitutional:  Negative for chills, fever and weight loss.  HENT:  Positive for hearing loss. Negative for tinnitus.   Respiratory:  Negative for cough, sputum production and shortness of breath.   Cardiovascular:  Negative for chest pain, palpitations and leg swelling.  Gastrointestinal:  Negative for abdominal pain, constipation, diarrhea and heartburn.  Genitourinary:  Negative for dysuria, frequency and urgency.  Musculoskeletal:  Positive for back pain. Negative for falls, joint pain and  myalgias.  Skin:  Positive for rash (improved).  Neurological:  Negative for dizziness and headaches.  Psychiatric/Behavioral:  Negative for depression and memory loss. The patient does not have insomnia.   Past Medical History:  Diagnosis Date   Allergy    Atrial fibrillation ([place])    Hyperlipidemia    Hypertension    Renal disorder    TIA (transient ischemic attack)    Vitamin B12 deficiency    Past Surgical History:  Procedure Laterality Date   ABDOMINAL HYSTERECTOMY     BREAST EXCISIONAL BIOPSY Right 36 years ago   neg   TUBAL LIGATION     Social History:   reports that she has never smoked. She has never used smokeless tobacco. She reports previous alcohol use. She reports that she does not use drugs.  Family History  Adopted: Yes    Medications: Patient's Medications  New Prescriptions   No medications on file  Previous Medications   APIXABAN (ELIQUIS) 2.5 MG TABS TABLET    Take 2.5 mg by mouth 2 (two) times daily.   CHOLECALCIFEROL (VITAMIN D3) 25 MCG (1000 UT) CAPS    Take 1,000 Units by mouth daily.   FLUTICASONE (FLONASE) 50 MCG/ACT NASAL SPRAY    Place 2 sprays into the nose daily.   LORATADINE (CLARITIN) 10 MG TABLET    Take 10 mg by mouth daily.   METOPROLOL SUCCINATE (TOPROL-XL) 25 MG 24 HR TABLET    metoprolol succinate  ER 25 mg tablet,extended release 24 hr   OLOPATADINE HCL (PAZEO) 0.7 % SOLN    Place 1 drop into both eyes daily as needed.   ROSUVASTATIN (CRESTOR) 10 MG TABLET    Take 5 mg by mouth daily.   VITAMIN B-12 (CYANOCOBALAMIN) 1000 MCG TABLET    Take 1,000 mcg by mouth daily.  Modified Medications   No medications on file  Discontinued Medications   APIXABAN (ELIQUIS) 5 MG TABS TABLET    Take 5 mg by mouth 2 (two) times daily.   ATENOLOL (TENORMIN) 25 MG TABLET    Take 12.5 mg by mouth every evening.    DOXYCYCLINE (VIBRA-TABS) 100 MG TABLET    Take 1 tablet (100 mg total) by mouth 2 (two) times daily.   FUROSEMIDE (LASIX) 20 MG TABLET    Take  20 mg by mouth daily.   TIZANIDINE (ZANAFLEX) 2 MG TABLET    Take 2 mg by mouth every 6 (six) hours.    Physical Exam:  Vitals:   03/04/21 1517  BP: 128/78  Pulse: 85  Resp: 18  SpO2: 95%  Weight: 147 lb (66.7 kg)  Height: 5' (1.524 m)   Body mass index is 28.71 kg/m. Wt Readings from Last 3 Encounters:  03/04/21 147 lb (66.7 kg)  02/20/21 146 lb (66.2 kg)  02/18/21 146 lb 8 oz (66.5 kg)    Physical Exam Constitutional:      General: She is not in acute distress.    Appearance: She is well-developed. She is not diaphoretic.  HENT:     Head: Normocephalic and atraumatic.     Mouth/Throat:     Pharynx: No oropharyngeal exudate.  Eyes:     Conjunctiva/sclera: Conjunctivae normal.     Pupils: Pupils are equal, round, and reactive to light.  Cardiovascular:     Rate and Rhythm: Normal rate. Rhythm irregular.     Heart sounds: Normal heart sounds.  Pulmonary:     Effort: Pulmonary effort is normal.     Breath sounds: Normal breath sounds.  Abdominal:     General: Bowel sounds are normal.     Palpations: Abdomen is soft.  Musculoskeletal:     Cervical back: Normal range of motion and neck supple.     Right lower leg: No edema.     Left lower leg: No edema.  Skin:    General: Skin is warm and dry.  Neurological:     Mental Status: She is alert.  Psychiatric:        Mood and Affect: Mood normal.    Labs reviewed: Basic Metabolic Panel: Recent Labs    11/11/20 0000  NA 142  K 4.8  CL 108  CO2 29*  BUN 32*  CREATININE 1.4*  CALCIUM 9.6   Liver Function Tests: Recent Labs    11/11/20 0000  AST 16  ALT 8  ALKPHOS 76  ALBUMIN 4.1   No results for input(s): LIPASE, AMYLASE in the last 8760 hours. No results for input(s): AMMONIA in the last 8760 hours. CBC: Recent Labs    11/11/20 0000  WBC 7.6  NEUTROABS 4.80  HGB 12.6  HCT 40  PLT 405*   Lipid Panel: Recent Labs    11/11/20 0000  CHOL 124  HDL 54  LDLCALC 52  TRIG 87   TSH: No  results for input(s): TSH in the last 8760 hours. A1C: Lab Results  Component Value Date   HGBA1C 5.4 08/20/2018     Assessment/Plan  1. Cellulitis of left lower extremity -improved on doxycycline.   2. Essential hypertension -well controlled on metoprolol   3. Stage 3a chronic kidney disease ([place]) -Encourage proper hydration and to avoid NSAIDS (Aleve, Advil, Motrin, Ibuprofen)   4. Vitamin B12 deficiency -continue on B12 supplement.   5. Spinal stenosis of lumbar region with neurogenic claudication Worse since she had to lift her husband. Plans to start PT  6. Permanent atrial fibrillation (HCC) Rate controlled on metoprolol. Continues on eliquis for anticoagulation and metoprolol for rate control   7. Seasonal allergies Stable on Flonase and Claritin.  8.hyperlipidemia Continues on crestor 5 mg daily    Next appt: 3 months, labs prior to appt.  Carlos American. [place], [facility] [phone]

## 2021-03-05 LAB — CBC AND DIFFERENTIAL
HCT: 38 (ref 36–46)
Hemoglobin: 12.9 (ref 12.0–16.0)
Neutrophils Absolute: 4425
Platelets: 409 — AB (ref 150–399)
WBC: 7.4

## 2021-03-05 LAB — CBC: RBC: 4.08 (ref 3.87–5.11)

## 2021-03-06 ENCOUNTER — Telehealth: Payer: Self-pay | Admitting: Nurse Practitioner

## 2021-03-06 NOTE — Telephone Encounter (Signed)
Lymes test was negative. Blood counts stable

## 2021-03-11 NOTE — Telephone Encounter (Signed)
Called and discussed lab results with patient. She verbalized her understanding. Patient requested that lab results be emailed to her at [email or web address]. Patient states that she still has spot on the back of her leg and is scheduled to see dermatologist next week.

## 2021-03-12 NOTE — Telephone Encounter (Signed)
Labs mailed to patient. Sherrie Mustache, NP reviewed labs. Dinah Ngetich signed off on labs since Sherrie Mustache, NP out of office. Labs were sent for scanning.

## 2021-03-14 ENCOUNTER — Other Ambulatory Visit: Payer: Self-pay | Admitting: Nurse Practitioner

## 2021-03-14 NOTE — Telephone Encounter (Signed)
Patient has request refill on medication "Eliquis". Patient hasn't had this medication filled by PCP Lauree Chandler, NP . Medication pend and sent to Marlowe Sax, NP due to PCP Dewaine Oats Carlos American, NP being out of office.

## 2021-04-14 DIAGNOSIS — M25821 Other specified joint disorders, right elbow: Secondary | ICD-10-CM | POA: Insufficient documentation

## 2021-05-02 ENCOUNTER — Telehealth: Payer: Self-pay

## 2021-05-02 DIAGNOSIS — Z91030 Bee allergy status: Secondary | ICD-10-CM

## 2021-05-02 MED ORDER — EPINEPHRINE 0.3 MG/0.3ML IJ SOAJ: 0.3000 mg | INTRAMUSCULAR | 3 refills | Status: DC | PRN

## 2021-05-02 NOTE — Telephone Encounter (Signed)
This encounter was created in error - please disregard.

## 2021-05-02 NOTE — Telephone Encounter (Signed)
Epi pen ordered and send to pharmacy.

## 2021-05-02 NOTE — Telephone Encounter (Addendum)
Patient would like to know if she can get a prescription for an epi pen. She is going out of town and the area she will be in has a large amount of bees and she is highly allergic. Medication is not on medication lisr.  Message routed to Marlowe Sax, NP, covering provider for Sherrie Mustache, NP

## 2021-05-13 ENCOUNTER — Telehealth: Payer: Self-pay

## 2021-05-13 MED ORDER — VITAMIN B-12 1000 MCG PO TABS: 1000.0000 ug | ORAL_TABLET | Freq: Every day | ORAL | 1 refills | Status: DC

## 2021-05-13 MED ORDER — METOPROLOL SUCCINATE ER 25 MG PO TB24: ORAL_TABLET | ORAL | 1 refills | Status: DC

## 2021-05-13 MED ORDER — METOPROLOL SUCCINATE ER 25 MG PO TB24: 25.0000 mg | ORAL_TABLET | Freq: Every day | ORAL | 1 refills | Status: DC

## 2021-05-13 NOTE — Addendum Note (Signed)
Addended by: Lauree Chandler on: 05/13/2021 03:21 PM   Modules accepted: Orders

## 2021-05-13 NOTE — Telephone Encounter (Signed)
[facility] in [place] called and stated that they received a Rx for Metoprolol but there is no Directions.   Needs updated Rx with Directions faxed back to them.

## 2021-05-13 NOTE — Telephone Encounter (Signed)
Patient request refill on medications

## 2021-06-02 ENCOUNTER — Other Ambulatory Visit: Payer: Medicare Other

## 2021-06-03 ENCOUNTER — Ambulatory Visit: Payer: Medicare Other | Admitting: Nurse Practitioner

## 2021-06-04 LAB — LIPID PANEL
Cholesterol: 121 (ref 0–200)
HDL: 56 (ref 35–70)
LDL Cholesterol: 46
Triglycerides: 100 (ref 40–160)

## 2021-06-04 LAB — HEPATIC FUNCTION PANEL
ALT: 8 (ref 7–35)
AST: 19 (ref 13–35)
Alkaline Phosphatase: 57 (ref 25–125)
Bilirubin, Total: 0.9

## 2021-06-04 LAB — BASIC METABOLIC PANEL
BUN: 34 — AB (ref 4–21)
CO2: 25 — AB (ref 13–22)
Chloride: 104 (ref 99–108)
Creatinine: 1.6 — AB (ref 0.5–1.1)
Glucose: 93
Potassium: 4.8 (ref 3.4–5.3)
Sodium: 137 (ref 137–147)

## 2021-06-04 LAB — COMPREHENSIVE METABOLIC PANEL
Albumin: 4 (ref 3.5–5.0)
Calcium: 9.9 (ref 8.7–10.7)
Globulin: 2.3

## 2021-06-04 LAB — VITAMIN B12: Vitamin B-12: 1738

## 2021-06-04 LAB — CBC AND DIFFERENTIAL
HCT: 41 (ref 36–46)
Hemoglobin: 13.6 (ref 12.0–16.0)
Platelets: 378 (ref 150–399)
WBC: 7

## 2021-06-04 LAB — CBC: RBC: 4.2 (ref 3.87–5.11)

## 2021-06-05 ENCOUNTER — Other Ambulatory Visit: Payer: Self-pay

## 2021-06-05 ENCOUNTER — Ambulatory Visit: Payer: Medicare Other | Admitting: Nurse Practitioner

## 2021-06-05 ENCOUNTER — Encounter: Payer: Self-pay | Admitting: Nurse Practitioner

## 2021-06-05 VITALS — BP 100/80 | HR 74 | Temp 98.4°F | Ht 60.0 in | Wt 143.0 lb

## 2021-06-05 DIAGNOSIS — I10 Essential (primary) hypertension: Secondary | ICD-10-CM | POA: Diagnosis not present

## 2021-06-05 DIAGNOSIS — E538 Deficiency of other specified B group vitamins: Secondary | ICD-10-CM

## 2021-06-05 DIAGNOSIS — M48062 Spinal stenosis, lumbar region with neurogenic claudication: Secondary | ICD-10-CM

## 2021-06-05 DIAGNOSIS — E782 Mixed hyperlipidemia: Secondary | ICD-10-CM | POA: Diagnosis not present

## 2021-06-05 DIAGNOSIS — H6121 Impacted cerumen, right ear: Secondary | ICD-10-CM

## 2021-06-05 DIAGNOSIS — I4821 Permanent atrial fibrillation: Secondary | ICD-10-CM

## 2021-06-05 DIAGNOSIS — N1831 Chronic kidney disease, stage 3a: Secondary | ICD-10-CM

## 2021-06-05 MED ORDER — ROSUVASTATIN CALCIUM 10 MG PO TABS: 5.0000 mg | ORAL_TABLET | Freq: Every day | ORAL | 1 refills | Status: DC

## 2021-06-05 NOTE — Progress Notes (Signed)
Careteam: Patient Care Team: Lauree Chandler, NP as PCP - General (Geriatric Medicine)  Advanced Directive information Does Patient Have a Medical Advance Directive?: Yes, Type of Advance Directive: [place];Living will;Out of facility DNR (pink MOST or yellow form), Does patient want to make changes to medical advance directive?: No - Patient declined  Allergies  Allergen Reactions   Lisinopril Swelling   Azithromycin Diarrhea   Baclofen Diarrhea   Iodine     CKD   Morphine And Related Nausea And Vomiting   Sulfa Antibiotics Rash    Chief Complaint  Patient presents with   Medical Management of Chronic Issues    3 month follow up . Patient would like to know about kidney function.Patient is getting flu vaccine this afternoon   Health Maintenance    Zoster,dexa scan, COVID booster, Flu vaccine     HPI: Patient is a 87 y.o. female seen in today at the [facility] for routine follow up.   A fib- has not seen cardiologist since she has been at [facility]. She is rate controlled on metoprolol and uses eliquis for antiocaugation.  No abnormal bruising or bleeding.  She was visiting with family in [place] and felt more short of breath during this time. Has improved now that she is back.   CKD- had blood drawn yesterday but results not in yet.   Trying to lose some weight.  Using a cream  for her leg.   Review of Systems:  Review of Systems  Constitutional:  Negative for chills, fever and weight loss.  HENT:  Positive for hearing loss. Negative for tinnitus.   Respiratory:  Negative for cough, sputum production and shortness of breath.   Cardiovascular:  Negative for chest pain, palpitations and leg swelling.  Gastrointestinal:  Negative for abdominal pain, constipation, diarrhea and heartburn.  Genitourinary:  Negative for dysuria, frequency and urgency.  Musculoskeletal:  Positive for back pain and myalgias. Negative for falls and joint  pain.  Skin: Negative.   Neurological:  Negative for dizziness and headaches.  Psychiatric/Behavioral:  Negative for depression and memory loss. The patient does not have insomnia.    Past Medical History:  Diagnosis Date   Allergy    Atrial fibrillation ([place])    Hyperlipidemia    Hypertension    Renal disorder    TIA (transient ischemic attack)    Vitamin B12 deficiency    Past Surgical History:  Procedure Laterality Date   ABDOMINAL HYSTERECTOMY     BREAST EXCISIONAL BIOPSY Right 36 years ago   neg   REPLACEMENT TOTAL KNEE Left    TONSILLECTOMY     TUBAL LIGATION     Social History:   reports that she has never smoked. She has never used smokeless tobacco. She reports that she does not currently use alcohol. She reports that she does not use drugs.  Family History  Adopted: Yes    Medications: Patient's Medications  New Prescriptions   No medications on file  Previous Medications   AUGMENTED BETAMETHASONE DIPROPIONATE (DIPROLENE-AF) 0.05 % OINTMENT    Apply topically 2 (two) times daily.   CHOLECALCIFEROL (VITAMIN D3) 25 MCG (1000 UT) CAPS    Take 1,000 Units by mouth daily.   ELIQUIS 2.5 MG TABS TABLET    TAKE 1 TABLET BY MOUTH EVERY 12 HOURS.   EPINEPHRINE 0.3 MG/0.3 ML IJ SOAJ INJECTION    Inject 0.3 mg into the muscle as needed for anaphylaxis.   FLUTICASONE (  FLONASE) 50 MCG/ACT NASAL SPRAY    Place 2 sprays into the nose as needed.   LORATADINE (CLARITIN) 10 MG TABLET    Take 10 mg by mouth daily.   METOPROLOL SUCCINATE (TOPROL-XL) 25 MG 24 HR TABLET    Take 1 tablet (25 mg total) by mouth daily. metoprolol succinate ER 25 mg tablet,extended release 24 hr   OLOPATADINE HCL (PAZEO) 0.7 % SOLN    Place 1 drop into both eyes daily as needed.   ROSUVASTATIN (CRESTOR) 10 MG TABLET    Take 5 mg by mouth daily.   TRIAMCINOLONE CREAM (KENALOG) 0.1 %    triamcinolone acetonide 0.1 % topical cream   VITAMIN B-12 (CYANOCOBALAMIN) 1000 MCG TABLET    Take 1 tablet (1,000 mcg  total) by mouth daily.  Modified Medications   No medications on file  Discontinued Medications   No medications on file    Physical Exam:  Vitals:   06/05/21 1043  BP: 100/80  Pulse: 74  Temp: 98.4 F (36.9 C)  TempSrc: Oral  SpO2: 97%  Weight: 143 lb (64.9 kg)  Height: 5' (1.524 m)   Body mass index is 27.93 kg/m. Wt Readings from Last 3 Encounters:  06/05/21 143 lb (64.9 kg)  03/04/21 147 lb (66.7 kg)  02/20/21 146 lb (66.2 kg)    Physical Exam Constitutional:      General: She is not in acute distress.    Appearance: She is well-developed. She is not diaphoretic.  HENT:     Head: Normocephalic and atraumatic.     Right Ear: There is impacted cerumen.     Left Ear: There is no impacted cerumen.     Mouth/Throat:     Pharynx: No oropharyngeal exudate.  Eyes:     Conjunctiva/sclera: Conjunctivae normal.     Pupils: Pupils are equal, round, and reactive to light.  Cardiovascular:     Rate and Rhythm: Normal rate and regular rhythm.     Heart sounds: Normal heart sounds.  Pulmonary:     Effort: Pulmonary effort is normal.     Breath sounds: Normal breath sounds.  Abdominal:     General: Bowel sounds are normal.     Palpations: Abdomen is soft.  Musculoskeletal:     Cervical back: Normal range of motion and neck supple.     Right lower leg: No edema.     Left lower leg: No edema.  Skin:    General: Skin is warm and dry.  Neurological:     Mental Status: She is alert and oriented to person, place, and time. Mental status is at baseline.  Psychiatric:        Mood and Affect: Mood normal.    Labs reviewed: Basic Metabolic Panel: Recent Labs    11/11/20 0000  NA 142  K 4.8  CL 108  CO2 29*  BUN 32*  CREATININE 1.4*  CALCIUM 9.6   Liver Function Tests: Recent Labs    11/11/20 0000  AST 16  ALT 8  ALKPHOS 76  ALBUMIN 4.1   No results for input(s): LIPASE, AMYLASE in the last 8760 hours. No results for input(s): AMMONIA in the last 8760  hours. CBC: Recent Labs    11/11/20 0000 02/27/21 0000  WBC 7.6 7.4  NEUTROABS 4.80 4,425.00  HGB 12.6 12.9  HCT 40 38  PLT 405* 409*   Lipid Panel: Recent Labs    11/11/20 0000  CHOL 124  HDL 54  LDLCALC 52  TRIG  87   TSH: No results for input(s): TSH in the last 8760 hours. A1C: Lab Results  Component Value Date   HGBA1C 5.4 08/20/2018     Assessment/Plan 1. Permanent atrial fibrillation ([place]) -would like referral to cardiology due to her a fib. She is rate controlled on metoprolol and on eliquis BID for anticoagulation.  - Ambulatory referral to Cardiology  2. Essential hypertension -well controlled on metoprolol. Continue dietary modifications.   3. Mixed hyperlipidemia -LDL at goal on recent labs.  - rosuvastatin (CRESTOR) 10 MG tablet; Take 0.5 tablets (5 mg total) by mouth daily.  Dispense: 45 tablet; Refill: 1  4. Stage 3a chronic kidney disease (HCC) Chronic and stable Encourage proper hydration Follow metabolic panel Avoid nephrotoxic meds (NSAIDS)  5. Vitamin B12 deficiency Continues on supplement.   6. Spinal stenosis of lumbar region with neurogenic claudication -followed by ortho. She is using tylenol to control pain but worse after her trip. Would like PT referral for ongoing evaluation and treatment. - Ambulatory referral to Physical Therapy  7. Impacted cerumen of right ear -lavage completed and tolerated well.   Next appt: 4 months.  Carlos American. [place], [facility] [phone]

## 2021-06-10 ENCOUNTER — Encounter: Payer: Self-pay | Admitting: Nurse Practitioner

## 2021-06-10 ENCOUNTER — Telehealth: Payer: Self-pay | Admitting: Nurse Practitioner

## 2021-06-10 NOTE — Telephone Encounter (Signed)
Labs abstracted. Awaiting return call from patient. If no return call in 24 hours a clinical team member will need to call patient again

## 2021-06-10 NOTE — Telephone Encounter (Signed)
Called pt about lab results - in media tab.  She did not answer Kidney disease has slightly worsened. Encourage proper hydration and to avoid NSAIDS (Aleve, Advil, Motrin, Ibuprofen)  B12 level is high (because she is on supplement) can decrease to 500 mcg daily  Cholesterol is at goal.  Blood counts are normal.   Please abstract labs into chart.

## 2021-06-10 NOTE — Telephone Encounter (Signed)
Patient notified and agreed.  Requested copy of labs to be mailed to her home address. Mailed.

## 2021-06-16 ENCOUNTER — Other Ambulatory Visit: Payer: Self-pay | Admitting: Nurse Practitioner

## 2021-06-18 ENCOUNTER — Telehealth: Payer: Self-pay

## 2021-06-18 NOTE — Telephone Encounter (Signed)
Patient is having surgery on elbow on November 10th and would like to know if it is ok for her to stop blood thinner 5 days prior to surgery and if she needs an appointment before surgery. Please advise.  Message routed to Sherrie Mustache, NP

## 2021-06-19 NOTE — Telephone Encounter (Signed)
Brittney [name] will complete in office

## 2021-06-19 NOTE — Telephone Encounter (Signed)
Called patient and left message for her to call office regarding whether or not she has a new form to be filled out.  I have the previous form she had. It's an excision of tumor, soft tissue of upper arm/elbow,subcutaneous.Right elbow. According to the form.

## 2021-06-19 NOTE — Telephone Encounter (Signed)
What procedure is she having? Generally there is a form that needs to be completed.

## 2021-06-30 ENCOUNTER — Encounter: Payer: Self-pay | Admitting: Cardiology

## 2021-06-30 ENCOUNTER — Ambulatory Visit (INDEPENDENT_AMBULATORY_CARE_PROVIDER_SITE_OTHER): Payer: Medicare Other | Admitting: Cardiology

## 2021-06-30 ENCOUNTER — Other Ambulatory Visit: Payer: Self-pay

## 2021-06-30 VITALS — BP 144/82 | HR 85 | Ht 60.0 in | Wt 147.0 lb

## 2021-06-30 DIAGNOSIS — I10 Essential (primary) hypertension: Secondary | ICD-10-CM

## 2021-06-30 DIAGNOSIS — E7800 Pure hypercholesterolemia, unspecified: Secondary | ICD-10-CM

## 2021-06-30 DIAGNOSIS — I4821 Permanent atrial fibrillation: Secondary | ICD-10-CM

## 2021-06-30 NOTE — Progress Notes (Signed)
Cardiology Office Note:    Date:  06/30/2021   ID:  Brittney [name], DOB Nov 17, 1929, MRN [id number]  PCP:  Lauree Chandler, NP   [place] [facility] Cardiologist:  Kate Sable, MD     Referring MD: Lauree Chandler, NP   Chief Complaint  Patient presents with   New Patient (Initial Visit)    Referred by PCP for Afib. Meds reviewed verbally with patient.     History of Present Illness:    Brittney [name] is a 87 y.o. female with a hx of hypertension, hyperlipidemia, TIA, permanent A. fib who presents to establish care.  States having a mini stroke in 2019, diagnosed with atrial fibrillation March of this year by PCP.  Started on Eliquis 2.5 mg twice daily.  Denies any bleeding issues.  Previously on lisinopril for BP control, developed allergies, lisinopril was stopped.  Toprol-XL started.  Denies palpitations, dizziness, syncope, chest pain.  Denies any history of heart disease.  Has some shortness of breath occasionally when she exerts herself, plans to walk more to help with conditioning.  Echocardiogram 07/2018 EF 45 to 50%.  Past Medical History:  Diagnosis Date   Allergy    Atrial fibrillation ([place])    Hyperlipidemia    Hypertension    Renal disorder    TIA (transient ischemic attack)    Vitamin B12 deficiency     Past Surgical History:  Procedure Laterality Date   ABDOMINAL HYSTERECTOMY     BREAST EXCISIONAL BIOPSY Right 36 years ago   neg   REPLACEMENT TOTAL KNEE Left    TONSILLECTOMY     TUBAL LIGATION      Current Medications: Current Meds  Medication Sig   augmented betamethasone dipropionate (DIPROLENE-AF) 0.05 % ointment Apply topically 2 (two) times daily.   Cholecalciferol (VITAMIN D3) 25 MCG (1000 UT) CAPS Take 1,000 Units by mouth daily.   ELIQUIS 2.5 MG TABS tablet TAKE 1 TABLET BY MOUTH EVERY 12 HOURS.   EPINEPHrine 0.3 mg/0.3 mL IJ SOAJ injection Inject 0.3 mg into the muscle as needed for anaphylaxis.   fluticasone (FLONASE)  50 MCG/ACT nasal spray Place 2 sprays into the nose as needed.   loratadine (CLARITIN) 10 MG tablet Take 10 mg by mouth daily.   metoprolol succinate (TOPROL-XL) 25 MG 24 hr tablet Take 1 tablet (25 mg total) by mouth daily. metoprolol succinate ER 25 mg tablet,extended release 24 hr   Olopatadine HCl (PAZEO) 0.7 % SOLN Place 1 drop into both eyes daily as needed.   rosuvastatin (CRESTOR) 10 MG tablet Take 0.5 tablets (5 mg total) by mouth daily.   triamcinolone cream (KENALOG) 0.1 % triamcinolone acetonide 0.1 % topical cream   vitamin B-12 (CYANOCOBALAMIN) 1000 MCG tablet Take 1 tablet (1,000 mcg total) by mouth daily.     Allergies:   Lisinopril, Azithromycin, Baclofen, Iodine, Morphine and related, and Sulfa antibiotics   Social History   Socioeconomic History   Marital status: Widowed    Spouse name: Not on file   Number of children: Not on file   Years of education: Not on file   Highest education level: Not on file  Occupational History   Not on file  Tobacco Use   Smoking status: Never   Smokeless tobacco: Never  Vaping Use   Vaping Use: Never used  Substance and Sexual Activity   Alcohol use: Not Currently   Drug use: Never   Sexual activity: Not Currently  Other Topics Concern   Not on file  Social History Narrative   Not on file   Social Determinants of Health   Financial Resource Strain: Not on file  Food Insecurity: Not on file  Transportation Needs: Not on file  Physical Activity: Not on file  Stress: Not on file  Social Connections: Not on file     Family History: The patient's family history is not on file. She was adopted.  ROS:   Please see the history of present illness.     All other systems reviewed and are negative.  EKGs/Labs/Other Studies Reviewed:    The following studies were reviewed today:   EKG:  EKG is  ordered today.  The ekg ordered today demonstrates atrial fibrillation, heart rate 85, possible old inferior infarct  Recent  Labs: 06/04/2021: ALT 8; BUN 34; Creatinine 1.6; Hemoglobin 13.6; Platelets 378; Potassium 4.8; Sodium 137  Recent Lipid Panel    Component Value Date/Time   CHOL 121 06/04/2021 0000   TRIG 100 06/04/2021 0000   HDL 56 06/04/2021 0000   CHOLHDL 2.2 08/20/2018 0343   VLDL 14 08/20/2018 0343   LDLCALC 46 06/04/2021 0000     Risk Assessment/Calculations:          Physical Exam:    VS:  BP (!) 144/82 (BP Location: Left Arm, Patient Position: Sitting, Cuff Size: Normal)   Pulse 85   Ht 5' (1.524 m)   Wt 147 lb (66.7 kg)   SpO2 97%   BMI 28.71 kg/m     Wt Readings from Last 3 Encounters:  06/30/21 147 lb (66.7 kg)  06/05/21 143 lb (64.9 kg)  03/04/21 147 lb (66.7 kg)     GEN:  Well nourished, well developed in no acute distress HEENT: Normal NECK: No JVD; No carotid bruits LYMPHATICS: No lymphadenopathy CARDIAC: Irregular irregular RESPIRATORY:  Clear to auscultation without rales, wheezing or rhonchi  ABDOMEN: Soft, non-tender, non-distended MUSCULOSKELETAL:  No edema; No deformity  SKIN: Warm and dry NEUROLOGIC:  Alert and oriented x 3 PSYCHIATRIC:  Normal affect   ASSESSMENT:    1. Permanent atrial fibrillation ([place])   2. Primary hypertension   3. Pure hypercholesterolemia    PLAN:    In order of problems listed above:  Permanent atrial fibrillation CHA2DS2-VASc score 6 (htn, age, gender ,cva).  Asymptomatic.  Continue Toprol-XL 25 mg daily, Eliquis 2.5 mg twice daily.  Get echocardiogram.  We will continue with rate control strategy. Hypertension, BP usually controlled.  Continue Toprol-XL. Hyperlipidemia, cholesterol controlled.  Follow-up after echocardiogram.        Medication Adjustments/Labs and Tests Ordered: Current medicines are reviewed at length with the patient today.  Concerns regarding medicines are outlined above.  Orders Placed This Encounter  Procedures   EKG 12-Lead   ECHOCARDIOGRAM COMPLETE   No orders of the defined types were  placed in this encounter.   Patient Instructions  Medication Instructions:  Your physician recommends that you continue on your current medications as directed. Please refer to the Current Medication list given to you today.  *If you need a refill on your cardiac medications before your next appointment, please call your pharmacy*   Lab Work: None ordered If you have labs (blood work) drawn today and your tests are completely normal, you will receive your results only by: [place] (if you have MyChart) OR A paper copy in the mail If you have any lab test that is abnormal or we need to change your treatment, we will call you to review the results.  Testing/Procedures:  Your physician has requested that you have an echocardiogram. Echocardiography is a painless test that uses sound waves to create images of your heart. It provides your doctor with information about the size and shape of your heart and how well your heart's chambers and valves are working. This procedure takes approximately one hour. There are no restrictions for this procedure.    Follow-Up: At [facility], you and your health needs are our priority.  As part of our continuing mission to provide you with exceptional heart care, we have created designated Provider Care Teams.  These Care Teams include your primary Cardiologist (physician) and Advanced Practice Providers (APPs -  Physician Assistants and Nurse Practitioners) who all work together to provide you with the care you need, when you need it.  We recommend signing up for the patient portal called "MyChart".  Sign up information is provided on this After Visit Summary.  MyChart is used to connect with patients for Virtual Visits (Telemedicine).  Patients are able to view lab/test results, encounter notes, upcoming appointments, etc.  Non-urgent messages can be sent to your provider as well.   To learn more about what you can do with MyChart, go to  [email or web address].    Your next appointment:   2 month(s)  The format for your next appointment:   In Person  Provider:   You may see Kate Sable, MD or one of the following Advanced Practice Providers on your designated Care Team:   Murray Hodgkins, NP Christell Faith, PA-C Cadence Kathlen Mody, [place]    Other Instructions    Signed, Kate Sable, MD  06/30/2021 5:20 PM    [place]

## 2021-06-30 NOTE — Patient Instructions (Signed)
Medication Instructions:  Your physician recommends that you continue on your current medications as directed. Please refer to the Current Medication list given to you today.  *If you need a refill on your cardiac medications before your next appointment, please call your pharmacy*   Lab Work: None ordered If you have labs (blood work) drawn today and your tests are completely normal, you will receive your results only by: [place] (if you have MyChart) OR A paper copy in the mail If you have any lab test that is abnormal or we need to change your treatment, we will call you to review the results.   Testing/Procedures:  Your physician has requested that you have an echocardiogram. Echocardiography is a painless test that uses sound waves to create images of your heart. It provides your doctor with information about the size and shape of your heart and how well your heart's chambers and valves are working. This procedure takes approximately one hour. There are no restrictions for this procedure.    Follow-Up: At [facility], you and your health needs are our priority.  As part of our continuing mission to provide you with exceptional heart care, we have created designated Provider Care Teams.  These Care Teams include your primary Cardiologist (physician) and Advanced Practice Providers (APPs -  Physician Assistants and Nurse Practitioners) who all work together to provide you with the care you need, when you need it.  We recommend signing up for the patient portal called "MyChart".  Sign up information is provided on this After Visit Summary.  MyChart is used to connect with patients for Virtual Visits (Telemedicine).  Patients are able to view lab/test results, encounter notes, upcoming appointments, etc.  Non-urgent messages can be sent to your provider as well.   To learn more about what you can do with MyChart, go to [email or web address].    Your next appointment:   2  month(s)  The format for your next appointment:   In Person  Provider:   You may see Kate Sable, MD or one of the following Advanced Practice Providers on your designated Care Team:   Murray Hodgkins, NP Christell Faith, PA-C Cadence Kathlen Mody, [place]    Other Instructions

## 2021-07-25 ENCOUNTER — Other Ambulatory Visit: Payer: Self-pay

## 2021-07-25 ENCOUNTER — Other Ambulatory Visit: Payer: Self-pay | Admitting: Orthopedic Surgery

## 2021-07-25 ENCOUNTER — Ambulatory Visit (INDEPENDENT_AMBULATORY_CARE_PROVIDER_SITE_OTHER): Payer: Medicare Other | Admitting: Orthopedic Surgery

## 2021-07-25 ENCOUNTER — Encounter: Payer: Self-pay | Admitting: Orthopedic Surgery

## 2021-07-25 VITALS — BP 130/90 | HR 98 | Temp 97.5°F | Ht 60.0 in | Wt 151.0 lb

## 2021-07-25 DIAGNOSIS — M940 Chondrocostal junction syndrome [Tietze]: Secondary | ICD-10-CM | POA: Diagnosis not present

## 2021-07-25 NOTE — Patient Instructions (Signed)
Call 911 if chest pain is constant/severe  May take another tylenol 1000 mg in the middle of day for extra pain as needed   Stretch upper body every morning

## 2021-07-25 NOTE — Progress Notes (Signed)
Careteam: Patient Care Team: Lauree Chandler, NP as PCP - General (Geriatric Medicine) Kate Sable, MD as PCP - Cardiology (Cardiology)  Seen by: Windell Moulding, AGNP-C  PLACE OF SERVICE:  [place]  Advanced Directive information    Allergies  Allergen Reactions   Lisinopril Swelling   Azithromycin Diarrhea   Baclofen Diarrhea   Iodine     CKD   Morphine And Related Nausea And Vomiting   Sulfa Antibiotics Rash    Chief Complaint  Patient presents with   Acute Visit    Patient having trouble with breathing. No chest pain,but having chest tightness. Started yesterday.No coughing.     HPI: Patient is a 87 y.o. female seen today for acute visit due to chest tightness.   When she takes a deep breath, her chest begins to hurt. Onset of symptoms began 2 days ago. Pain described as tight, rated 2/10, aggravated with movement. Denies recent falls or physical straining/lifting. Admits to waking up with pain and sleeping in awkward position. She has a history of indigestion in the past.  Admits to avoiding food triggers. She has been taking tylenol 1000 mg twice daily with some relief.    Denies constant chest pain, shortness of breath, arm pain or nausea.   History of TIA in past, remains on Eliquis daily. She has not missed any doses of Eliquis recently .   Admits to some increased anxiety. She is adopting a dog and plans to pick up in a few days. Denies panic attacks.    Review of Systems:  Review of Systems  Constitutional:  Negative for chills, fever, malaise/fatigue and weight loss.  HENT: Negative.    Eyes: Negative.   Respiratory:  Negative for cough, shortness of breath and wheezing.   Cardiovascular:  Positive for chest pain. Negative for palpitations, orthopnea and leg swelling.  Gastrointestinal:  Negative for abdominal pain, constipation, diarrhea, heartburn, nausea and vomiting.  Genitourinary: Negative.   Musculoskeletal:  Positive for back pain.  Negative for falls.  Skin: Negative.   Neurological:  Negative for dizziness, weakness and headaches.  Psychiatric/Behavioral:  Negative for depression. The patient is nervous/anxious.    Past Medical History:  Diagnosis Date   Allergy    Atrial fibrillation ([place])    Hyperlipidemia    Hypertension    Renal disorder    TIA (transient ischemic attack)    Vitamin B12 deficiency    Past Surgical History:  Procedure Laterality Date   ABDOMINAL HYSTERECTOMY     BREAST EXCISIONAL BIOPSY Right 36 years ago   neg   REPLACEMENT TOTAL KNEE Left    TONSILLECTOMY     TUBAL LIGATION     Social History:   reports that she has never smoked. She has never used smokeless tobacco. She reports that she does not currently use alcohol. She reports that she does not use drugs.  Family History  Adopted: Yes    Medications: Patient's Medications  New Prescriptions   No medications on file  Previous Medications   CHOLECALCIFEROL (VITAMIN D3) 25 MCG (1000 UT) CAPS    Take 1,000 Units by mouth daily.   ELIQUIS 2.5 MG TABS TABLET    TAKE 1 TABLET BY MOUTH EVERY 12 HOURS.   EPINEPHRINE 0.3 MG/0.3 ML IJ SOAJ INJECTION    Inject 0.3 mg into the muscle as needed for anaphylaxis.   FLUTICASONE (FLONASE) 50 MCG/ACT NASAL SPRAY    Place 2 sprays into the nose as needed.   LORATADINE (  CLARITIN) 10 MG TABLET    Take 10 mg by mouth daily.   METOPROLOL SUCCINATE (TOPROL-XL) 25 MG 24 HR TABLET    Take 1 tablet (25 mg total) by mouth daily. metoprolol succinate ER 25 mg tablet,extended release 24 hr   OLOPATADINE HCL (PAZEO) 0.7 % SOLN    Place 1 drop into both eyes daily as needed.   ROSUVASTATIN (CRESTOR) 10 MG TABLET    Take 0.5 tablets (5 mg total) by mouth daily.   TRIAMCINOLONE CREAM (KENALOG) 0.1 %    triamcinolone acetonide 0.1 % topical cream   VITAMIN B-12 (CYANOCOBALAMIN) 1000 MCG TABLET    Take 1 tablet (1,000 mcg total) by mouth daily.  Modified Medications   No medications on file  Discontinued  Medications   AUGMENTED BETAMETHASONE DIPROPIONATE (DIPROLENE-AF) 0.05 % OINTMENT    Apply topically 2 (two) times daily.    Physical Exam:  Vitals:   07/25/21 1526  BP: 130/90  Pulse: 98  Temp: (!) 97.5 F (36.4 C)  Weight: 151 lb (68.5 kg)  Height: 5' (1.524 m)   Body mass index is 29.49 kg/m. Wt Readings from Last 3 Encounters:  07/25/21 151 lb (68.5 kg)  06/30/21 147 lb (66.7 kg)  06/05/21 143 lb (64.9 kg)    Physical Exam Vitals reviewed.  Constitutional:      General: She is not in acute distress. HENT:     Head: Normocephalic.  Eyes:     General:        Right eye: No discharge.        Left eye: No discharge.  Neck:     Vascular: No carotid bruit.  Cardiovascular:     Rate and Rhythm: Normal rate and regular rhythm.     Pulses: Normal pulses.     Heart sounds: Normal heart sounds. No murmur heard. Pulmonary:     Effort: Pulmonary effort is normal. No respiratory distress.     Breath sounds: Normal breath sounds. No wheezing.  Abdominal:     General: Bowel sounds are normal. There is no distension.     Palpations: Abdomen is soft.     Tenderness: There is no abdominal tenderness.  Musculoskeletal:     Cervical back: Normal and normal range of motion.     Thoracic back: Normal.     Lumbar back: Normal.     Right lower leg: No edema.     Left lower leg: No edema.     Comments: Pain confirmed when pressing on wall in exam room, denies pain when sitting still.   Lymphadenopathy:     Cervical: No cervical adenopathy.  Skin:    General: Skin is warm and dry.     Capillary Refill: Capillary refill takes less than 2 seconds.  Neurological:     General: No focal deficit present.     Mental Status: She is alert and oriented to person, place, and time.  Psychiatric:        Mood and Affect: Mood normal.        Behavior: Behavior normal.    Labs reviewed: Basic Metabolic Panel: Recent Labs    11/11/20 0000 06/04/21 0000  NA 142 137  K 4.8 4.8  CL 108  104  CO2 29* 25*  BUN 32* 34*  CREATININE 1.4* 1.6*  CALCIUM 9.6 9.9   Liver Function Tests: Recent Labs    11/11/20 0000 06/04/21 0000  AST 16 19  ALT 8 8  ALKPHOS 76 57  ALBUMIN 4.1  4.0   No results for input(s): LIPASE, AMYLASE in the last 8760 hours. No results for input(s): AMMONIA in the last 8760 hours. CBC: Recent Labs    11/11/20 0000 02/27/21 0000 06/04/21 0000  WBC 7.6 7.4 7.0  NEUTROABS 4.80 4,425.00  --   HGB 12.6 12.9 13.6  HCT 40 38 41  PLT 405* 409* 378   Lipid Panel: Recent Labs    11/11/20 0000 06/04/21 0000  CHOL 124 121  HDL 54 56  LDLCALC 52 46  TRIG 87 100   TSH: No results for input(s): TSH in the last 8760 hours. A1C: Lab Results  Component Value Date   HGBA1C 5.4 08/20/2018     Assessment/Plan 1. Costochondritis - suspect muscular in nature or anxiety of upcoming pet - pain with movement, described as tight - exam unremarkable - cont tylenol to 1000 mg po bid - recommend increasing tylenol 1000 mg po daily prn- for increased pain - advised to call 911 if chest pain becomes constant/severe - recommend stretching every morning  Total time: 21 minutes. Greater than 50% of total time spent doing patient education on symptom/medication management regarding chest tightness.    Next appt: none [facility] [place], [facility] [phone]

## 2021-08-11 ENCOUNTER — Telehealth: Payer: Self-pay | Admitting: Cardiology

## 2021-08-11 ENCOUNTER — Other Ambulatory Visit: Payer: Self-pay

## 2021-08-11 ENCOUNTER — Ambulatory Visit (INDEPENDENT_AMBULATORY_CARE_PROVIDER_SITE_OTHER): Payer: Medicare Other

## 2021-08-11 DIAGNOSIS — I4821 Permanent atrial fibrillation: Secondary | ICD-10-CM

## 2021-08-11 LAB — ECHOCARDIOGRAM COMPLETE
Area-P 1/2: 4.71 cm2
S' Lateral: 4 cm
Single Plane A4C EF: 52.6 %

## 2021-08-11 NOTE — Telephone Encounter (Signed)
° °  Pre-operative Risk Assessment    Patient Name: Brittney [name]  DOB: 08-Mar-1930 MRN: [id number]      Request for Surgical Clearance   PATIENT CALLING  Procedure:   injection for back pain   Date of Surgery:  Clearance 08/20/21                                 Surgeon:  Dr. Sabra Heck  Surgeon's Group or Practice Name:  [facility]  Phone number:  [phone] PATIENT PHONE Brittney [name]  Fax number:  not provided    Type of Clearance Requested:   - Pharmacy:  Hold Apixaban (Eliquis)     Type of Anesthesia:  Not Indicated   Additional requests/questions:   PATIENT CALLING   Brittney [name]   08/11/2021, 1:45 PM

## 2021-08-12 NOTE — Telephone Encounter (Signed)
Patient with diagnosis of atrial fibrillation on Eliquis for anticoagulation.    Procedure: injection for back pain Date of procedure: 08/20/21   CHA2DS2-VASc Score = 8   This indicates a 10.8% annual risk of stroke. The patient's score is based upon: CHF History: 0 HTN History: 1 Diabetes History: 1 Stroke History: 2 Vascular Disease History: 1 Age Score: 2 Gender Score: 1  Stroke history:  per chart TIA in December 2019, was hospitalized overnight.   CrCl 21 Platelet count 378  Although patient has high CHADS2-VASc score, her CrCl is low, so will review with primary cardiologist as to whether patient should be bridged.

## 2021-08-14 NOTE — Telephone Encounter (Signed)
Dr. [name]  I was actually hoping we could get by without bridging her, due to the low CrCl and her age.  I realize they need a 3 day hold and her CHADS2-VASc score is high, but it looks as thought the TIA was without residual effects.  Will defer to your choice, it's easy to arrange the bridge - it would be 2 doses (1 each morning on the first 2 days of hold).    Thanks,  Erasmo Downer

## 2021-08-15 NOTE — Telephone Encounter (Addendum)
° °  Primary Cardiologist: Kate Sable, MD  Chart reviewed as part of pre-operative protocol coverage. Given past medical history and time since last visit, based on ACC/AHA guidelines, Brittney [name] would be at acceptable risk for the planned procedure without further cardiovascular testing.   Patient with diagnosis of atrial fibrillation on Eliquis for anticoagulation.     Procedure: injection for back pain Date of procedure: 08/20/21     CHA2DS2-VASc Score = 8   This indicates a 10.8% annual risk of stroke. The patient's score is based upon: CHF History: 0 HTN History: 1 Diabetes History: 1 Stroke History: 2 Vascular Disease History: 1 Age Score: 2 Gender Score: 1   Stroke history:  per chart TIA in December 2019, was hospitalized overnight.    CrCl 21 Platelet count 378  Patient will need Lovenox 60 mg  x 2 syringes.  Last dose of Eliquis evening of December 24.  Will do one dose of Lovenox on evening of Dec 25 and Dec 26.  No injections on Dec 27 and procedure on 28.  Please resume Eliquis as soon as possible after injection at the discretion of the surgeon.   I will route this recommendation to the requesting party via Epic fax function and remove from pre-op pool.  Please call with questions.  Jossie Ng. Bryar Dahms NP-C    08/15/2021, 7:45 AM [place] [address] [address] Office [phone] Fax [phone]

## 2021-08-15 NOTE — Telephone Encounter (Signed)
Patient will need Lovenox 60 mg  x 2 syringes.  Last dose of Eliquis evening of December 24.  Will do one dose of Lovenox on evening of Dec 25 and Dec 26.  No injections on Dec 27 and procedure on 28.

## 2021-08-15 NOTE — Telephone Encounter (Signed)
Brittney [name]  Phone: [phone]  Clearance faxed over to requesting office

## 2021-08-15 NOTE — Telephone Encounter (Signed)
Preoperative call back team, please find the fax number for this encounter.  Thank you.

## 2021-08-15 NOTE — Telephone Encounter (Signed)
Spoke with patient about lovenox bridge.  She does not want to do injections and is considering not having her spinal injection next week.  Advised that she call if she decides to re-schedule, we can discuss options with MD

## 2021-08-28 ENCOUNTER — Encounter: Payer: Self-pay | Admitting: Nurse Practitioner

## 2021-08-28 ENCOUNTER — Other Ambulatory Visit: Payer: Self-pay

## 2021-08-28 ENCOUNTER — Ambulatory Visit: Payer: Medicare Other | Admitting: Nurse Practitioner

## 2021-08-28 VITALS — BP 100/70 | HR 84 | Temp 98.4°F | Ht 60.0 in | Wt 144.0 lb

## 2021-08-28 DIAGNOSIS — M1711 Unilateral primary osteoarthritis, right knee: Secondary | ICD-10-CM

## 2021-08-28 DIAGNOSIS — I872 Venous insufficiency (chronic) (peripheral): Secondary | ICD-10-CM

## 2021-08-28 DIAGNOSIS — I4821 Permanent atrial fibrillation: Secondary | ICD-10-CM

## 2021-08-28 DIAGNOSIS — Z8673 Personal history of transient ischemic attack (TIA), and cerebral infarction without residual deficits: Secondary | ICD-10-CM | POA: Diagnosis not present

## 2021-08-28 NOTE — Patient Instructions (Addendum)
Make sure you are eating 3 meals a day- at least 30 gm protein per meal.  Increase water intake.  Nutritional supplement to increase protein.   -encouraged to elevate legs above level of heart as tolerates, low sodium diet, compression hose as tolerates (on in am, off in pm)

## 2021-08-28 NOTE — Progress Notes (Signed)
Careteam: Patient Care Team: Lauree Chandler, NP as PCP - General (Geriatric Medicine) Kate Sable, MD as PCP - Cardiology (Cardiology)  Advanced Directive information    Allergies  Allergen Reactions   Lisinopril Swelling   Azithromycin Diarrhea   Baclofen Diarrhea   Iodine     CKD   Morphine And Related Nausea And Vomiting   Sulfa Antibiotics Rash    Chief Complaint  Patient presents with   Acute Visit    Patient thinks she had a mini stroke the other day. Patient does have pain in knee. She states that she can't seem to put sentences together. She could not remember how to compose an email or when trying to call maintenance couldn't get the word washing machine out. She had had TIA before. This happened Monday.Patient states she checked BP and it was 160/91 at the time this happened.     HPI: Patient is a 88 y.o. female seen in today at the [facility]  Reports earlier this week she was trying to write an email but could not think of the words she wanted to say and then she had to call maintenance for her washer but could not think of the words to say. This was similar to what happened before when she had her TIA.   She never had the shot in her back due to having to be off eliquis. She has been consistent with taking her medication. She felt like it was too big of a risk so never had it.   She is following up with her cardiologist later this month due to a fib.   Generally blood pressure at home well controlled 120-130/70-80 Low today, does not feel like she is drinking enough fluids.   No fevers or chills, no dysuria, no N/V/D or skin issues   Report chronic right knee pain- reports bone on bone. Needing to follow up with her orthopedic. Takes tylenol but not effective.  Reports she got a new dog. Still able to walk with her.   Review of Systems:  Review of Systems  Constitutional:  Negative for chills, fever and weight loss.  HENT:  Negative for  tinnitus.   Respiratory:  Negative for cough, sputum production and shortness of breath.   Cardiovascular:  Negative for chest pain, palpitations and leg swelling.  Gastrointestinal:  Negative for abdominal pain, constipation, diarrhea and heartburn.  Genitourinary:  Negative for dysuria, frequency and urgency.  Musculoskeletal:  Positive for joint pain. Negative for back pain, falls and myalgias.  Skin: Negative.   Neurological:  Negative for dizziness and headaches.  Psychiatric/Behavioral:  Negative for depression and memory loss. The patient does not have insomnia.    Past Medical History:  Diagnosis Date   Allergy    Atrial fibrillation ([place])    Hyperlipidemia    Hypertension    Renal disorder    TIA (transient ischemic attack)    Vitamin B12 deficiency    Past Surgical History:  Procedure Laterality Date   ABDOMINAL HYSTERECTOMY     BREAST EXCISIONAL BIOPSY Right 36 years ago   neg   REPLACEMENT TOTAL KNEE Left    TONSILLECTOMY     TUBAL LIGATION     Social History:   reports that she has never smoked. She has never used smokeless tobacco. She reports that she does not currently use alcohol. She reports that she does not use drugs.  Family History  Adopted: Yes    Medications: Patient's Medications  New Prescriptions   No medications on file  Previous Medications   CHOLECALCIFEROL (VITAMIN D3) 25 MCG (1000 UT) CAPS    Take 1,000 Units by mouth daily.   ELIQUIS 2.5 MG TABS TABLET    TAKE 1 TABLET BY MOUTH EVERY 12 HOURS.   EPINEPHRINE 0.3 MG/0.3 ML IJ SOAJ INJECTION    Inject 0.3 mg into the muscle as needed for anaphylaxis.   FLUTICASONE (FLONASE) 50 MCG/ACT NASAL SPRAY    Place 2 sprays into the nose as needed.   LORATADINE (CLARITIN) 10 MG TABLET    Take 10 mg by mouth daily.   METOPROLOL SUCCINATE (TOPROL-XL) 25 MG 24 HR TABLET    Take 1 tablet (25 mg total) by mouth daily. metoprolol succinate ER 25 mg tablet,extended release 24 hr   OLOPATADINE HCL (PAZEO)  0.7 % SOLN    Place 1 drop into both eyes daily as needed.   ROSUVASTATIN (CRESTOR) 10 MG TABLET    Take 0.5 tablets (5 mg total) by mouth daily.   TRIAMCINOLONE CREAM (KENALOG) 0.1 %    triamcinolone acetonide 0.1 % topical cream   VITAMIN B-12 (CYANOCOBALAMIN) 1000 MCG TABLET    Take 1 tablet (1,000 mcg total) by mouth daily.  Modified Medications   No medications on file  Discontinued Medications   No medications on file    Physical Exam:  Vitals:   08/28/21 1011  BP: 100/70  Pulse: 84  Temp: 98.4 F (36.9 C)  TempSrc: Oral  SpO2: 96%  Weight: 144 lb (65.3 kg)  Height: 5' (1.524 m)   Body mass index is 28.12 kg/m. Wt Readings from Last 3 Encounters:  08/28/21 144 lb (65.3 kg)  07/25/21 151 lb (68.5 kg)  06/30/21 147 lb (66.7 kg)    Physical Exam Constitutional:      General: She is not in acute distress.    Appearance: She is well-developed. She is not diaphoretic.  HENT:     Head: Normocephalic and atraumatic.     Mouth/Throat:     Pharynx: No oropharyngeal exudate.  Eyes:     Conjunctiva/sclera: Conjunctivae normal.     Pupils: Pupils are equal, round, and reactive to light.  Cardiovascular:     Rate and Rhythm: Normal rate and regular rhythm.     Heart sounds: Normal heart sounds.  Pulmonary:     Effort: Pulmonary effort is normal.     Breath sounds: Normal breath sounds.  Abdominal:     General: Bowel sounds are normal.     Palpations: Abdomen is soft.  Musculoskeletal:     Cervical back: Normal range of motion and neck supple.     Right lower leg: No edema.     Left lower leg: No edema.  Skin:    General: Skin is warm and dry.  Neurological:     Mental Status: She is alert.  Psychiatric:        Mood and Affect: Mood normal.    Labs reviewed: Basic Metabolic Panel: Recent Labs    11/11/20 0000 06/04/21 0000  NA 142 137  K 4.8 4.8  CL 108 104  CO2 29* 25*  BUN 32* 34*  CREATININE 1.4* 1.6*  CALCIUM 9.6 9.9   Liver Function  Tests: Recent Labs    11/11/20 0000 06/04/21 0000  AST 16 19  ALT 8 8  ALKPHOS 76 57  ALBUMIN 4.1 4.0   No results for input(s): LIPASE, AMYLASE in the last 8760 hours. No results for input(s):  AMMONIA in the last 8760 hours. CBC: Recent Labs    11/11/20 0000 02/27/21 0000 06/04/21 0000  WBC 7.6 7.4 7.0  NEUTROABS 4.80 4,425.00  --   HGB 12.6 12.9 13.6  HCT 40 38 41  PLT 405* 409* 378   Lipid Panel: Recent Labs    11/11/20 0000 06/04/21 0000  CHOL 124 121  HDL 54 56  LDLCALC 52 46  TRIG 87 100   TSH: No results for input(s): TSH in the last 8760 hours. A1C: Lab Results  Component Value Date   HGBA1C 5.4 08/20/2018     Assessment/Plan 1. Hx of transient ischemic attack (TIA) -has had 2 events where she could not find her words that only lasted a few minutes. Discussed risk reduction with proper blood pressure, cholesterol and blood sugar control. Currently LDLs at goal, she is on statin, bp at goal and blood sugar at goal. She is on eliquis due to a fib.   2. Primary osteoarthritis of right knee Followed by orthopedic, plans to make appt. Continues on tylenol PRN  3. Permanent atrial fibrillation ([place]) -continues eliquis due to a fib. She has not missed a dose or held medication.  Has follow up scheduled with cardiology  4. Edema of both lower extremities due to peripheral venous insufficiency -stable, encouraged to elevate legs above level of heart as tolerates, low sodium diet, compression hose as tolerates (on in am, off in pm)    Next appt: 10/07/2021 as scheduled, sooner if needed Dequavious Harshberger K. Page, [facility] [phone]

## 2021-09-04 LAB — CBC: RBC: 4 (ref 3.87–5.11)

## 2021-09-04 LAB — BASIC METABOLIC PANEL
BUN: 30 — AB (ref 4–21)
CO2: 27 — AB (ref 13–22)
Chloride: 105 (ref 99–108)
Creatinine: 1.5 — AB (ref 0.5–1.1)
Glucose: 101
Potassium: 4.6 (ref 3.4–5.3)
Sodium: 138 (ref 137–147)

## 2021-09-04 LAB — CBC AND DIFFERENTIAL
HCT: 39 (ref 36–46)
Hemoglobin: 13 (ref 12.0–16.0)
Neutrophils Absolute: 4584
Platelets: 412 — AB (ref 150–399)
WBC: 7.3

## 2021-09-04 LAB — HEPATIC FUNCTION PANEL
ALT: 6 — AB (ref 7–35)
AST: 14 (ref 13–35)
Alkaline Phosphatase: 45 (ref 25–125)
Bilirubin, Total: 0.8

## 2021-09-04 LAB — TSH: TSH: 3.56 (ref 0.41–5.90)

## 2021-09-04 LAB — COMPREHENSIVE METABOLIC PANEL
Albumin: 4.1 (ref 3.5–5.0)
Calcium: 9.6 (ref 8.7–10.7)
Globulin: 2.2

## 2021-09-08 ENCOUNTER — Encounter: Payer: Self-pay | Admitting: Cardiology

## 2021-09-08 ENCOUNTER — Other Ambulatory Visit: Payer: Self-pay

## 2021-09-08 ENCOUNTER — Ambulatory Visit (INDEPENDENT_AMBULATORY_CARE_PROVIDER_SITE_OTHER): Payer: Medicare Other | Admitting: Cardiology

## 2021-09-08 VITALS — BP 132/62 | HR 91 | Ht 60.0 in | Wt 143.0 lb

## 2021-09-08 DIAGNOSIS — E7800 Pure hypercholesterolemia, unspecified: Secondary | ICD-10-CM | POA: Diagnosis not present

## 2021-09-08 DIAGNOSIS — I10 Essential (primary) hypertension: Secondary | ICD-10-CM

## 2021-09-08 DIAGNOSIS — I444 Left anterior fascicular block: Secondary | ICD-10-CM

## 2021-09-08 DIAGNOSIS — G459 Transient cerebral ischemic attack, unspecified: Secondary | ICD-10-CM

## 2021-09-08 DIAGNOSIS — I4821 Permanent atrial fibrillation: Secondary | ICD-10-CM

## 2021-09-08 HISTORY — DX: Left anterior fascicular block: I44.4

## 2021-09-08 MED ORDER — ASPIRIN EC 81 MG PO TBEC: 81.0000 mg | DELAYED_RELEASE_TABLET | Freq: Every day | ORAL | 3 refills | Status: DC

## 2021-09-08 NOTE — Patient Instructions (Signed)
Medication Instructions:   Your physician has recommended you make the following change in your medication:   START taking Aspirin 81 MG once a day.  *If you need a refill on your cardiac medications before your next appointment, please call your pharmacy*   Lab Work: None ordered If you have labs (blood work) drawn today and your tests are completely normal, you will receive your results only by: [place] (if you have MyChart) OR A paper copy in the mail If you have any lab test that is abnormal or we need to change your treatment, we will call you to review the results.   Testing/Procedures: None ordered   Follow-Up: At [facility], you and your health needs are our priority.  As part of our continuing mission to provide you with exceptional heart care, we have created designated Provider Care Teams.  These Care Teams include your primary Cardiologist (physician) and Advanced Practice Providers (APPs -  Physician Assistants and Nurse Practitioners) who all work together to provide you with the care you need, when you need it.  We recommend signing up for the patient portal called "MyChart".  Sign up information is provided on this After Visit Summary.  MyChart is used to connect with patients for Virtual Visits (Telemedicine).  Patients are able to view lab/test results, encounter notes, upcoming appointments, etc.  Non-urgent messages can be sent to your provider as well.   To learn more about what you can do with MyChart, go to [email or web address].    Your next appointment:   6 month(s)  The format for your next appointment:   In Person  Provider:   You may see Kate Sable, MD or one of the following Advanced Practice Providers on your designated Care Team:   Murray Hodgkins, NP Christell Faith, PA-C    Other Instructions

## 2021-09-08 NOTE — Progress Notes (Signed)
Cardiology Office Note:    Date:  09/08/2021   ID:  Brittney [name], DOB 14-Aug-1930, MRN [id number]  PCP:  Lauree Chandler, NP   [facility] Providers Cardiologist:  Kate Sable, MD     Referring MD: Lauree Chandler, NP   Chief Complaint  Patient presents with   Other    Follow up psot ECHO. Patient thinks she had another TIA about 2 weeks ago. She stated that she could not form sentence for an e-mail she was sending and her BP was 190s/90s. Meds reviewed verbally with patient.     History of Present Illness:    Brittney [name] is a 88 y.o. female with a hx of hypertension, hyperlipidemia, TIA 2019, permanent A. fib who presents to for follow-up.  She was last seen 2 establish care due to atrial fibrillation.  Denies palpitations, dizziness, chest pain or syncope.  Takes Eliquis 2.5 mg daily for stroke prophylaxis.  Echocardiogram was obtained to evaluate EF.  Presents for results.  Has a history of swelling with lisinopril.  This was stopped.  Toprol-XL started.  States she might have had another TIA 2 weeks ago.  She was writing a sentence on her computer, suddenly lost her ability for word finding, could not write a sentence.  States symptoms are similar to when she had her prior TIA about 3 years ago.  Otherwise feels okay, denies chest pain or shortness of breath.  Prior notes Echocardiogram 07/2018 EF 45 to 50%.   Past Medical History:  Diagnosis Date   Allergy    Atrial fibrillation ([place])    Hyperlipidemia    Hypertension    Renal disorder    TIA (transient ischemic attack)    Vitamin B12 deficiency     Past Surgical History:  Procedure Laterality Date   ABDOMINAL HYSTERECTOMY     BREAST EXCISIONAL BIOPSY Right 36 years ago   neg   REPLACEMENT TOTAL KNEE Left    TONSILLECTOMY     TUBAL LIGATION      Current Medications: Current Meds  Medication Sig   aspirin EC 81 MG tablet Take 1 tablet (81 mg total) by mouth daily. Swallow whole.    Cholecalciferol (VITAMIN D3) 25 MCG (1000 UT) CAPS Take 1,000 Units by mouth daily.   ELIQUIS 2.5 MG TABS tablet TAKE 1 TABLET BY MOUTH EVERY 12 HOURS.   EPINEPHrine 0.3 mg/0.3 mL IJ SOAJ injection Inject 0.3 mg into the muscle as needed for anaphylaxis.   fluticasone (FLONASE) 50 MCG/ACT nasal spray Place 2 sprays into the nose as needed.   loratadine (CLARITIN) 10 MG tablet Take 10 mg by mouth daily.   metoprolol succinate (TOPROL-XL) 25 MG 24 hr tablet Take 1 tablet (25 mg total) by mouth daily. metoprolol succinate ER 25 mg tablet,extended release 24 hr   Olopatadine HCl (PAZEO) 0.7 % SOLN Place 1 drop into both eyes daily as needed.   rosuvastatin (CRESTOR) 10 MG tablet Take 0.5 tablets (5 mg total) by mouth daily.   triamcinolone cream (KENALOG) 0.1 % triamcinolone acetonide 0.1 % topical cream   vitamin B-12 (CYANOCOBALAMIN) 1000 MCG tablet Take 1 tablet (1,000 mcg total) by mouth daily.     Allergies:   Lisinopril, Azithromycin, Baclofen, Iodine, Morphine and related, and Sulfa antibiotics   Social History   Socioeconomic History   Marital status: Widowed    Spouse name: Not on file   Number of children: Not on file   Years of education: Not on file  Highest education level: Not on file  Occupational History   Not on file  Tobacco Use   Smoking status: Never   Smokeless tobacco: Never  Vaping Use   Vaping Use: Never used  Substance and Sexual Activity   Alcohol use: Not Currently   Drug use: Never   Sexual activity: Not Currently  Other Topics Concern   Not on file  Social History Narrative   Not on file   Social Determinants of Health   Financial Resource Strain: Not on file  Food Insecurity: Not on file  Transportation Needs: Not on file  Physical Activity: Not on file  Stress: Not on file  Social Connections: Not on file     Family History: The patient's family history is not on file. She was adopted.  ROS:   Please see the history of present illness.      All other systems reviewed and are negative.  EKGs/Labs/Other Studies Reviewed:    The following studies were reviewed today:   EKG:  EKG is  ordered today.  The ekg ordered today demonstrates atrial fibrillation, heart rate 91  Recent Labs: 06/04/2021: ALT 8; BUN 34; Creatinine 1.6; Hemoglobin 13.6; Platelets 378; Potassium 4.8; Sodium 137  Recent Lipid Panel    Component Value Date/Time   CHOL 121 06/04/2021 0000   TRIG 100 06/04/2021 0000   HDL 56 06/04/2021 0000   CHOLHDL 2.2 08/20/2018 0343   VLDL 14 08/20/2018 0343   LDLCALC 46 06/04/2021 0000     Risk Assessment/Calculations:          Physical Exam:    VS:  BP 132/62 (BP Location: Left Arm, Patient Position: Sitting, Cuff Size: Normal)    Pulse 91    Ht 5' (1.524 m)    Wt 143 lb (64.9 kg)    SpO2 98%    BMI 27.93 kg/m     Wt Readings from Last 3 Encounters:  09/08/21 143 lb (64.9 kg)  08/28/21 144 lb (65.3 kg)  07/25/21 151 lb (68.5 kg)     GEN:  Well nourished, well developed in no acute distress HEENT: Normal NECK: No JVD; No carotid bruits LYMPHATICS: No lymphadenopathy CARDIAC: Irregular irregular RESPIRATORY:  Clear to auscultation without rales, wheezing or rhonchi  ABDOMEN: Soft, non-tender, non-distended MUSCULOSKELETAL:  No edema; No deformity  SKIN: Warm and dry NEUROLOGIC:  Alert and oriented x 3 PSYCHIATRIC:  Normal affect   ASSESSMENT:    1. Permanent atrial fibrillation ([place])   2. Primary hypertension   3. Pure hypercholesterolemia   4. TIA (transient ischemic attack)     PLAN:    In order of problems listed above:  Permanent atrial fibrillation CHA2DS2-VASc score 6 (htn, age, gender ,cva).  Denies palpitations, shortness of breath.  Continue Toprol-XL 25 mg daily, Eliquis 2.5 mg twice daily.  Echo EF 45 to 50%. Hypertension, BP usually controlled.  Continue Toprol-XL. Hyperlipidemia, cholesterol controlled.  Continue Crestor. TIA had another neurologic event despite being on  Eliquis.  Start aspirin 81 mg daily, continue Eliquis.  Refer to neurology for additional input.  Follow-up in 6 months        Medication Adjustments/Labs and Tests Ordered: Current medicines are reviewed at length with the patient today.  Concerns regarding medicines are outlined above.  Orders Placed This Encounter  Procedures   Ambulatory referral to Neurology   EKG 12-Lead   Meds ordered this encounter  Medications   aspirin EC 81 MG tablet    Sig: Take 1  tablet (81 mg total) by mouth daily. Swallow whole.    Dispense:  90 tablet    Refill:  3     Patient Instructions  Medication Instructions:   Your physician has recommended you make the following change in your medication:   START taking Aspirin 81 MG once a day.  *If you need a refill on your cardiac medications before your next appointment, please call your pharmacy*   Lab Work: None ordered If you have labs (blood work) drawn today and your tests are completely normal, you will receive your results only by: [place] (if you have MyChart) OR A paper copy in the mail If you have any lab test that is abnormal or we need to change your treatment, we will call you to review the results.   Testing/Procedures: None ordered   Follow-Up: At [facility], you and your health needs are our priority.  As part of our continuing mission to provide you with exceptional heart care, we have created designated Provider Care Teams.  These Care Teams include your primary Cardiologist (physician) and Advanced Practice Providers (APPs -  Physician Assistants and Nurse Practitioners) who all work together to provide you with the care you need, when you need it.  We recommend signing up for the patient portal called "MyChart".  Sign up information is provided on this After Visit Summary.  MyChart is used to connect with patients for Virtual Visits (Telemedicine).  Patients are able to view lab/test results, encounter notes,  upcoming appointments, etc.  Non-urgent messages can be sent to your provider as well.   To learn more about what you can do with MyChart, go to [email or web address].    Your next appointment:   6 month(s)  The format for your next appointment:   In Person  Provider:   You may see Kate Sable, MD or one of the following Advanced Practice Providers on your designated Care Team:   Murray Hodgkins, NP Christell Faith, PA-C    Other Instructions      Signed, Kate Sable, MD  09/08/2021 2:27 PM    [place]

## 2021-09-16 ENCOUNTER — Other Ambulatory Visit: Payer: Self-pay | Admitting: Family

## 2021-09-16 NOTE — Telephone Encounter (Signed)
Patient has request refill on medication "Eliquis 2.5mg ". Patient medication last refilled 06/16/2021. Medication has High Risk Warnings. Medication pend and sent to PCP Lauree Chandler, NP .

## 2021-09-17 ENCOUNTER — Telehealth: Payer: Self-pay | Admitting: Nurse Practitioner

## 2021-09-17 NOTE — Telephone Encounter (Signed)
TSH level normal. electrolytes, glucose, liver and kidney function are stable.  Blood counts stable, platelets slightly elevated but will continue to monitor  Labs in media tab for review. Please notify pt of results

## 2021-09-19 ENCOUNTER — Telehealth: Payer: Self-pay

## 2021-09-19 NOTE — Telephone Encounter (Signed)
Called patient to discuss lab results. Left message for patient to call office when available.

## 2021-09-23 NOTE — Telephone Encounter (Signed)
Discussed results with patient and she verbalized her understanding.

## 2021-10-07 ENCOUNTER — Encounter: Payer: Self-pay | Admitting: Nurse Practitioner

## 2021-10-07 ENCOUNTER — Ambulatory Visit: Payer: Medicare Other | Admitting: Nurse Practitioner

## 2021-10-07 ENCOUNTER — Other Ambulatory Visit: Payer: Self-pay

## 2021-10-07 VITALS — BP 118/70 | HR 73 | Temp 97.9°F | Ht 60.0 in | Wt 146.0 lb

## 2021-10-07 DIAGNOSIS — I10 Essential (primary) hypertension: Secondary | ICD-10-CM

## 2021-10-07 DIAGNOSIS — I4821 Permanent atrial fibrillation: Secondary | ICD-10-CM | POA: Diagnosis not present

## 2021-10-07 DIAGNOSIS — D6869 Other thrombophilia: Secondary | ICD-10-CM | POA: Diagnosis not present

## 2021-10-07 DIAGNOSIS — E2839 Other primary ovarian failure: Secondary | ICD-10-CM | POA: Diagnosis not present

## 2021-10-07 DIAGNOSIS — N1831 Chronic kidney disease, stage 3a: Secondary | ICD-10-CM | POA: Diagnosis not present

## 2021-10-07 DIAGNOSIS — I872 Venous insufficiency (chronic) (peripheral): Secondary | ICD-10-CM

## 2021-10-07 DIAGNOSIS — M1711 Unilateral primary osteoarthritis, right knee: Secondary | ICD-10-CM

## 2021-10-07 DIAGNOSIS — Z8673 Personal history of transient ischemic attack (TIA), and cerebral infarction without residual deficits: Secondary | ICD-10-CM

## 2021-10-07 DIAGNOSIS — E782 Mixed hyperlipidemia: Secondary | ICD-10-CM

## 2021-10-07 NOTE — Progress Notes (Signed)
Careteam: Patient Care Team: Lauree Chandler, NP as PCP - General (Geriatric Medicine) Kate Sable, MD as PCP - Cardiology (Cardiology)  Advanced Directive information Does Patient Have a Medical Advance Directive?: Yes, Type of Advance Directive: Out of facility DNR (pink MOST or yellow form);[place];Living will  Allergies  Allergen Reactions   Lisinopril Swelling   Azithromycin Diarrhea   Baclofen Diarrhea   Iodine     CKD   Morphine And Related Nausea And Vomiting   Sulfa Antibiotics Rash    Chief Complaint  Patient presents with   Medical Management of Chronic Issues    4 month follow up. Patient has concerns about glucose level. Patient has concerns about being prediabetic.   Health Maintenance    Discuss the need for shingrix vaccine, dexa scan and flu vaccine.     HPI: Patient is a 88 y.o. female seen in today at the [facility] for routine follow up.   Reports she has had issues with diarrhea. Eating a lot of butterscotch candies, woke up with stomach ache. She can not tolerate artifical sweeters.   Blood glucose on lab was 101.   Pt with a fib- she does not like to be on eliquis due to cost. Reports 700$ for 3 months.  She went to cardiologist, place on ASA due to possibly TIA.  She does bruise easily not worse since on ASA.  No falls.   Hyperlipidemia- LDL at goal on last labs.   Htn- well controlled.   Review of Systems:  Review of Systems  Constitutional:  Negative for chills, fever and weight loss.  HENT:  Negative for tinnitus.   Respiratory:  Negative for cough, sputum production and shortness of breath.   Cardiovascular:  Negative for chest pain, palpitations and leg swelling.  Gastrointestinal:  Negative for abdominal pain, constipation, diarrhea and heartburn.  Genitourinary:  Negative for dysuria, frequency and urgency.  Musculoskeletal:  Positive for joint pain. Negative for back pain, falls and myalgias.   Skin: Negative.   Neurological:  Negative for dizziness and headaches.  Psychiatric/Behavioral:  Negative for depression and memory loss. The patient does not have insomnia.    Past Medical History:  Diagnosis Date   Allergy    Atrial fibrillation ([place])    Hyperlipidemia    Hypertension    Renal disorder    TIA (transient ischemic attack)    Vitamin B12 deficiency    Past Surgical History:  Procedure Laterality Date   ABDOMINAL HYSTERECTOMY     BREAST EXCISIONAL BIOPSY Right 36 years ago   neg   REPLACEMENT TOTAL KNEE Left    TONSILLECTOMY     TUBAL LIGATION     Social History:   reports that she has never smoked. She has never used smokeless tobacco. She reports that she does not currently use alcohol. She reports that she does not use drugs.  Family History  Adopted: Yes    Medications: Patient's Medications  New Prescriptions   No medications on file  Previous Medications   ASPIRIN EC 81 MG TABLET    Take 1 tablet (81 mg total) by mouth daily. Swallow whole.   CHOLECALCIFEROL (VITAMIN D3) 25 MCG (1000 UT) CAPS    Take 1,000 Units by mouth daily.   ELIQUIS 2.5 MG TABS TABLET    TAKE 1 TABLET BY MOUTH EVERY 12 HOURS.   EPINEPHRINE 0.3 MG/0.3 ML IJ SOAJ INJECTION    Inject 0.3 mg into the muscle as  needed for anaphylaxis.   FLUTICASONE (FLONASE) 50 MCG/ACT NASAL SPRAY    Place 2 sprays into the nose as needed.   LORATADINE (CLARITIN) 10 MG TABLET    Take 10 mg by mouth daily.   METOPROLOL SUCCINATE (TOPROL-XL) 25 MG 24 HR TABLET    Take 1 tablet (25 mg total) by mouth daily. metoprolol succinate ER 25 mg tablet,extended release 24 hr   OLOPATADINE HCL (PAZEO) 0.7 % SOLN    Place 1 drop into both eyes daily as needed.   ROSUVASTATIN (CRESTOR) 10 MG TABLET    Take 0.5 tablets (5 mg total) by mouth daily.   TRIAMCINOLONE CREAM (KENALOG) 0.1 %    triamcinolone acetonide 0.1 % topical cream   VITAMIN B-12 (CYANOCOBALAMIN) 1000 MCG TABLET    Take 1 tablet (1,000 mcg total) by  mouth daily.  Modified Medications   No medications on file  Discontinued Medications   No medications on file    Physical Exam:  Vitals:   10/07/21 1012  BP: 118/70  Pulse: 73  Temp: 97.9 F (36.6 C)  TempSrc: Oral  SpO2: 98%  Weight: 146 lb (66.2 kg)  Height: 5' (1.524 m)   Body mass index is 28.51 kg/m. Wt Readings from Last 3 Encounters:  10/07/21 146 lb (66.2 kg)  09/08/21 143 lb (64.9 kg)  08/28/21 144 lb (65.3 kg)    Physical Exam Constitutional:      General: She is not in acute distress.    Appearance: She is well-developed. She is not diaphoretic.  HENT:     Head: Normocephalic and atraumatic.     Mouth/Throat:     Pharynx: No oropharyngeal exudate.  Eyes:     Conjunctiva/sclera: Conjunctivae normal.     Pupils: Pupils are equal, round, and reactive to light.  Cardiovascular:     Rate and Rhythm: Normal rate and regular rhythm.     Heart sounds: Normal heart sounds.  Pulmonary:     Effort: Pulmonary effort is normal.     Breath sounds: Normal breath sounds.  Abdominal:     General: Bowel sounds are normal.     Palpations: Abdomen is soft.  Musculoskeletal:     Cervical back: Normal range of motion and neck supple.     Right lower leg: No edema.     Left lower leg: No edema.  Skin:    General: Skin is warm and dry.  Neurological:     Mental Status: She is alert and oriented to person, place, and time.     Gait: Gait normal.  Psychiatric:        Mood and Affect: Mood normal.    Labs reviewed: Basic Metabolic Panel: Recent Labs    11/11/20 0000 06/04/21 0000 09/04/21 0000  NA 142 137 138  K 4.8 4.8 4.6  CL 108 104 105  CO2 29* 25* 27*  BUN 32* 34* 30*  CREATININE 1.4* 1.6* 1.5*  CALCIUM 9.6 9.9 9.6  TSH  --   --  3.56   Liver Function Tests: Recent Labs    11/11/20 0000 06/04/21 0000 09/04/21 0000  AST 16 19 14   ALT 8 8 6*  ALKPHOS 76 57 45  ALBUMIN 4.1 4.0 4.1   No results for input(s): LIPASE, AMYLASE in the last 8760  hours. No results for input(s): AMMONIA in the last 8760 hours. CBC: Recent Labs    11/11/20 0000 02/27/21 0000 06/04/21 0000 09/04/21 0000  WBC 7.6 7.4 7.0 7.3  NEUTROABS 4.80  4,425.00  --  4,584.00  HGB 12.6 12.9 13.6 13.0  HCT 40 38 41 39  PLT 405* 409* 378 412*   Lipid Panel: Recent Labs    11/11/20 0000 06/04/21 0000  CHOL 124 121  HDL 54 56  LDLCALC 52 46  TRIG 87 100   TSH: Recent Labs    09/04/21 0000  TSH 3.56   A1C: Lab Results  Component Value Date   HGBA1C 5.4 08/20/2018     Assessment/Plan 1. Estrogen deficiency - DG Bone Density; Future  2. Hx of transient ischemic attack (TIA) -has started ASA with eliquis -will monitor.   3. Primary osteoarthritis of right knee -plans to follow up with orthopedic for further evaluation. Can use tylenol as needed pain.  4. Permanent atrial fibrillation (HCC) Rate controlled. Continues on metoprolol. Followed by cardiology at this time  5. Edema of both lower extremities due to peripheral venous insufficiency -well controlled at this time.   6. Essential hypertension -Blood pressure well controlled Continue current medications Recheck metabolic panel  7. Mixed hyperlipidemia -cholesterol at goal. Continue current regimen.  8. Stage 3a chronic kidney disease ([place]) -Encourage proper hydration and to avoid NSAIDS (Aleve, Advil, Motrin, Ibuprofen)   9. Acquired thrombophilia ([place]) Continues on eliquis, will follow CBC, no abnormal bruising or bleeding noted.    Next appt: 6 months, labs prior Elias Bordner K. Bedias, [facility] [phone]

## 2021-10-07 NOTE — Patient Instructions (Addendum)
Please send [place] a msg on dates of your shingles and flu shot   Increase fiber, benefiber daily

## 2021-10-30 ENCOUNTER — Other Ambulatory Visit (HOSPITAL_COMMUNITY): Payer: Self-pay | Admitting: Neurology

## 2021-10-30 ENCOUNTER — Other Ambulatory Visit: Payer: Self-pay | Admitting: Neurology

## 2021-10-30 DIAGNOSIS — G459 Transient cerebral ischemic attack, unspecified: Secondary | ICD-10-CM

## 2021-11-08 ENCOUNTER — Ambulatory Visit
Admission: RE | Admit: 2021-11-08 | Discharge: 2021-11-08 | Disposition: A | Discharge status: Home / Self Care | Payer: Medicare Other | Source: Ambulatory Visit | Attending: Neurology | Admitting: Neurology

## 2021-11-08 ENCOUNTER — Other Ambulatory Visit: Payer: Self-pay

## 2021-11-08 DIAGNOSIS — G459 Transient cerebral ischemic attack, unspecified: Secondary | ICD-10-CM | POA: Diagnosis present

## 2021-11-13 ENCOUNTER — Other Ambulatory Visit: Payer: Self-pay | Admitting: Nurse Practitioner

## 2021-11-25 ENCOUNTER — Telehealth: Payer: Self-pay

## 2021-11-25 ENCOUNTER — Ambulatory Visit (INDEPENDENT_AMBULATORY_CARE_PROVIDER_SITE_OTHER): Payer: Medicare Other | Admitting: Nurse Practitioner

## 2021-11-25 DIAGNOSIS — Z0000 Encounter for general adult medical examination without abnormal findings: Secondary | ICD-10-CM

## 2021-11-25 NOTE — Patient Instructions (Signed)
Brittney [name] , ?Thank you for taking time to come for your Medicare Wellness Visit. I appreciate your ongoing commitment to your health goals. Please review the following plan we discussed and let me know if I can assist you in the future.  ? ?Screening recommendations/referrals: ?Colonoscopy aged out ?Mammogram aged out ?Bone Density ordered ?Recommended yearly ophthalmology/optometry visit for glaucoma screening and checkup ?Recommended yearly dental visit for hygiene and checkup ? ?Vaccinations: ?Influenza vaccine due in October ?Pneumococcal vaccine up to date ?Tdap vaccine up to date ?Shingles vaccine DUE- recommend to get at your local pharmacy      ? ?Advanced directives: on file ? ?Conditions/risks identified: advance age ? ?Next appointment: yearly  ? ? ?Preventive Care 71 Years and Older, Female ?Preventive care refers to lifestyle choices and visits with your health care provider that can promote health and wellness. ?What does preventive care include? ?A yearly physical exam. This is also called an annual well check. ?Dental exams once or twice a year. ?Routine eye exams. Ask your health care provider how often you should have your eyes checked. ?Personal lifestyle choices, including: ?Daily care of your teeth and gums. ?Regular physical activity. ?Eating a healthy diet. ?Avoiding tobacco and drug use. ?Limiting alcohol use. ?Practicing safe sex. ?Taking low-dose aspirin every day. ?Taking vitamin and mineral supplements as recommended by your health care provider. ?What happens during an annual well check? ?The services and screenings done by your health care provider during your annual well check will depend on your age, overall health, lifestyle risk factors, and family history of disease. ?Counseling  ?Your health care provider may ask you questions about your: ?Alcohol use. ?Tobacco use. ?Drug use. ?Emotional well-being. ?Home and relationship well-being. ?Sexual activity. ?Eating habits. ?History of  falls. ?Memory and ability to understand (cognition). ?Work and work Statistician. ?Reproductive health. ?Screening  ?You may have the following tests or measurements: ?Height, weight, and BMI. ?Blood pressure. ?Lipid and cholesterol levels. These may be checked every 5 years, or more frequently if you are over 88 years old. ?Skin check. ?Lung cancer screening. You may have this screening every year starting at age 27 if you have a 30-pack-year history of smoking and currently smoke or have quit within the past 15 years. ?Fecal occult blood test (FOBT) of the stool. You may have this test every year starting at age 48. ?Flexible sigmoidoscopy or colonoscopy. You may have a sigmoidoscopy every 5 years or a colonoscopy every 10 years starting at age 60. ?Hepatitis C blood test. ?Hepatitis B blood test. ?Sexually transmitted disease (STD) testing. ?Diabetes screening. This is done by checking your blood sugar (glucose) after you have not eaten for a while (fasting). You may have this done every 1-3 years. ?Bone density scan. This is done to screen for osteoporosis. You may have this done starting at age 31. ?Mammogram. This may be done every 1-2 years. Talk to your health care provider about how often you should have regular mammograms. ?Talk with your health care provider about your test results, treatment options, and if necessary, the need for more tests. ?Vaccines  ?Your health care provider may recommend certain vaccines, such as: ?Influenza vaccine. This is recommended every year. ?Tetanus, diphtheria, and acellular pertussis (Tdap, Td) vaccine. You may need a Td booster every 10 years. ?Zoster vaccine. You may need this after age 65. ?Pneumococcal 13-valent conjugate (PCV13) vaccine. One dose is recommended after age 7. ?Pneumococcal polysaccharide (PPSV23) vaccine. One dose is recommended after age 14. ?Talk to  your health care provider about which screenings and vaccines you need and how often you need  them. ?This information is not intended to replace advice given to you by your health care provider. Make sure you discuss any questions you have with your health care provider. ?Document Released: 09/06/2015 Document Revised: 04/29/2016 Document Reviewed: 06/11/2015 ?Elsevier Interactive Patient Education ? 2017 [employer]. ? ?Fall Prevention in the Home ?Falls can cause injuries. They can happen to people of all ages. There are many things you can do to make your home safe and to help prevent falls. ?What can I do on the outside of my home? ?Regularly fix the edges of walkways and driveways and fix any cracks. ?Remove anything that might make you trip as you walk through a door, such as a raised step or threshold. ?Trim any bushes or trees on the path to your home. ?Use bright outdoor lighting. ?Clear any walking paths of anything that might make someone trip, such as rocks or tools. ?Regularly check to see if handrails are loose or broken. Make sure that both sides of any steps have handrails. ?Any raised decks and porches should have guardrails on the edges. ?Have any leaves, snow, or ice cleared regularly. ?Use sand or salt on walking paths during winter. ?Clean up any spills in your garage right away. This includes oil or grease spills. ?What can I do in the bathroom? ?Use night lights. ?Install grab bars by the toilet and in the tub and shower. Do not use towel bars as grab bars. ?Use non-skid mats or decals in the tub or shower. ?If you need to sit down in the shower, use a plastic, non-slip stool. ?Keep the floor dry. Clean up any water that spills on the floor as soon as it happens. ?Remove soap buildup in the tub or shower regularly. ?Attach bath mats securely with double-sided non-slip rug tape. ?Do not have throw rugs and other things on the floor that can make you trip. ?What can I do in the bedroom? ?Use night lights. ?Make sure that you have a light by your bed that is easy to reach. ?Do not use  any sheets or blankets that are too big for your bed. They should not hang down onto the floor. ?Have a firm chair that has side arms. You can use this for support while you get dressed. ?Do not have throw rugs and other things on the floor that can make you trip. ?What can I do in the kitchen? ?Clean up any spills right away. ?Avoid walking on wet floors. ?Keep items that you use a lot in easy-to-reach places. ?If you need to reach something above you, use a strong step stool that has a grab bar. ?Keep electrical cords out of the way. ?Do not use floor polish or wax that makes floors slippery. If you must use wax, use non-skid floor wax. ?Do not have throw rugs and other things on the floor that can make you trip. ?What can I do with my stairs? ?Do not leave any items on the stairs. ?Make sure that there are handrails on both sides of the stairs and use them. Fix handrails that are broken or loose. Make sure that handrails are as long as the stairways. ?Check any carpeting to make sure that it is firmly attached to the stairs. Fix any carpet that is loose or worn. ?Avoid having throw rugs at the top or bottom of the stairs. If you do have throw rugs, attach  them to the floor with carpet tape. ?Make sure that you have a light switch at the top of the stairs and the bottom of the stairs. If you do not have them, ask someone to add them for you. ?What else can I do to help prevent falls? ?Wear shoes that: ?Do not have high heels. ?Have rubber bottoms. ?Are comfortable and fit you well. ?Are closed at the toe. Do not wear sandals. ?If you use a stepladder: ?Make sure that it is fully opened. Do not climb a closed stepladder. ?Make sure that both sides of the stepladder are locked into place. ?Ask someone to hold it for you, if possible. ?Clearly mark and make sure that you can see: ?Any grab bars or handrails. ?First and last steps. ?Where the edge of each step is. ?Use tools that help you move around (mobility aids)  if they are needed. These include: ?Canes. ?Walkers. ?Scooters. ?Crutches. ?Turn on the lights when you go into a dark area. Replace any light bulbs as soon as they burn out. ?Set up your furniture so you have

## 2021-11-25 NOTE — Telephone Encounter (Signed)
Ms. anniah, glick are scheduled for a virtual visit with your provider today.   ? ?Just as we do with appointments in the office, we must obtain your consent to participate.  Your consent will be active for this visit and any virtual visit you may have with one of our providers in the next 365 days.   ? ?If you have a MyChart account, I can also send a copy of this consent to you electronically.  All virtual visits are billed to your insurance company just like a traditional visit in the office.  As this is a virtual visit, video technology does not allow for your provider to perform a traditional examination.  This may limit your provider's ability to fully assess your condition.  If your provider identifies any concerns that need to be evaluated in person or the need to arrange testing such as labs, EKG, etc, we will make arrangements to do so.   ? ?Although advances in technology are sophisticated, we cannot ensure that it will always work on either your end or our end.  If the connection with a video visit is poor, we may have to switch to a telephone visit.  With either a video or telephone visit, we are not always able to ensure that we have a secure connection.   I need to obtain your verbal consent now.   Are you willing to proceed with your visit today?  ? ?Sathvika Ojo has provided verbal consent on 11/25/2021 for a virtual visit (video or telephone). ? ? ?Carroll Kinds, CMA ?11/25/2021  2:09 PM ?  ?

## 2021-11-25 NOTE — Progress Notes (Signed)
This service is provided via telemedicine ? ?No vital signs collected/recorded due to the encounter was a telemedicine visit.  ? ?Location of patient (ex: home, work):  Home ? ?Patient consents to a telephone visit:  Yes, see encounter dated 11/25/2021 ? ?Location of the provider (ex: office, home):  [place] ? ?Name of any referring provider:  N/A ? ?Names of all persons participating in the telemedicine service and their role in the encounter:  Sherrie Mustache, Nurse Practitioner, Carroll Kinds, CMA, and patient.  ? ?Time spent on call:  9 minutes with medical assistant ? ?

## 2021-11-25 NOTE — Progress Notes (Signed)
? ?Subjective:  ? Brittney [name] is a 88 y.o. female who presents for Medicare Annual (Subsequent) preventive examination. ? ?Review of Systems    ? ?Cardiac Risk Factors include: hypertension;dyslipidemia;advanced age (>18men, >31 women) ? ?   ?Objective:  ?  ?There were no vitals filed for this visit. ?There is no height or weight on file to calculate BMI. ? ? ?  11/25/2021  ?  1:54 PM 10/07/2021  ? 10:10 AM 06/05/2021  ? 10:43 AM 03/04/2021  ?  3:21 PM 02/05/2021  ?  9:05 PM 07/15/2019  ?  5:38 AM 07/14/2019  ?  3:18 PM  ?Advanced Directives  ?Does Patient Have a Medical Advance Directive? Yes Yes Yes No No Yes Yes  ?Type of Paramedic of [place];Living will;Out of facility DNR (pink MOST or yellow form) Out of facility DNR (pink MOST or yellow form);[place];Living will [place];Living will;Out of facility DNR (pink MOST or yellow form)   Healthcare Power of [place]  ?Does patient want to make changes to medical advance directive? No - Patient declined  No - Patient declined   No - Guardian declined No - Guardian declined  ?Copy of [place] in Chart? No - copy requested No - copy requested Yes - validated most recent copy scanned in chart (See row information)      ?Would patient like information on creating a medical advance directive?    No - Patient declined     ?Pre-existing out of facility DNR order (yellow form or pink MOST form) Yellow form placed in chart (order not valid for inpatient use)        ? ? ?Current Medications (verified) ?Outpatient Encounter Medications as of 11/25/2021  ?Medication Sig  ? aspirin EC 81 MG tablet Take 1 tablet (81 mg total) by mouth daily. Swallow whole.  ? Cholecalciferol (VITAMIN D3) 25 MCG (1000 UT) CAPS Take 1,000 Units by mouth daily.  ? ELIQUIS 2.5 MG TABS tablet TAKE 1 TABLET BY MOUTH EVERY 12 HOURS.  ? EPINEPHrine 0.3 mg/0.3 mL IJ SOAJ injection Inject 0.3  mg into the muscle as needed for anaphylaxis.  ? fluticasone (FLONASE) 50 MCG/ACT nasal spray Place 2 sprays into the nose as needed.  ? loratadine (CLARITIN) 10 MG tablet Take 10 mg by mouth daily.  ? metoprolol succinate (TOPROL-XL) 25 MG 24 hr tablet TAKE 1 TABLET DAILY  ? Olopatadine HCl (PAZEO) 0.7 % SOLN Place 1 drop into both eyes daily as needed.  ? rosuvastatin (CRESTOR) 10 MG tablet Take 0.5 tablets (5 mg total) by mouth daily.  ? vitamin B-12 (CYANOCOBALAMIN) 1000 MCG tablet Take 1 tablet (1,000 mcg total) by mouth daily. (Patient taking differently: Take 1,000 mcg by mouth daily. 1/2 tablet)  ? triamcinolone cream (KENALOG) 0.1 % triamcinolone acetonide 0.1 % topical cream (Patient not taking: Reported on 11/25/2021)  ? ?No facility-administered encounter medications on file as of 11/25/2021.  ? ? ?Allergies (verified) ?Lisinopril, Azithromycin, Baclofen, Iodine, Morphine and related, and Sulfa antibiotics  ? ?History: ?Past Medical History:  ?Diagnosis Date  ? Allergy   ? Atrial fibrillation ([place])   ? Hyperlipidemia   ? Hypertension   ? Renal disorder   ? TIA (transient ischemic attack)   ? Vitamin B12 deficiency   ? ?Past Surgical History:  ?Procedure Laterality Date  ? ABDOMINAL HYSTERECTOMY    ? BREAST EXCISIONAL BIOPSY Right 36 years ago  ? neg  ?  REPLACEMENT TOTAL KNEE Left   ? TONSILLECTOMY    ? TUBAL LIGATION    ? ?Family History  ?Adopted: Yes  ? ?Social History  ? ?Socioeconomic History  ? Marital status: Widowed  ?  Spouse name: Not on file  ? Number of children: Not on file  ? Years of education: Not on file  ? Highest education level: Not on file  ?Occupational History  ? Not on file  ?Tobacco Use  ? Smoking status: Never  ? Smokeless tobacco: Never  ?Vaping Use  ? Vaping Use: Never used  ?Substance and Sexual Activity  ? Alcohol use: Not Currently  ? Drug use: Never  ? Sexual activity: Not Currently  ?Other Topics Concern  ? Not on file  ?Social History Narrative  ? Not on file  ? ?Social  Determinants of Health  ? ?Financial Resource Strain: Not on file  ?Food Insecurity: Not on file  ?Transportation Needs: Not on file  ?Physical Activity: Not on file  ?Stress: Not on file  ?Social Connections: Not on file  ? ? ?Tobacco Counseling ?Counseling given: Not Answered ? ? ?Clinical Intake: ? ?Pre-visit preparation completed: No ? ?Pain : No/denies pain ? ?  ? ?BMI - recorded: 26 ?Nutritional Status: BMI 25 -29 Overweight ?Diabetes: No ? ?How often do you need to have someone help you when you read instructions, pamphlets, or other written materials from your doctor or pharmacy?: 1 - Never ? ?Diabetic?no ? ?  ? ?  ? ? ?Activities of Daily Living ? ?  11/25/2021  ?  2:14 PM  ?In your present state of health, do you have any difficulty performing the following activities:  ?Hearing? 1  ?Vision? 0  ?Difficulty concentrating or making decisions? 0  ?Walking or climbing stairs? 0  ?Dressing or bathing? 0  ?Doing errands, shopping? 0  ?Preparing Food and eating ? N  ?Using the Toilet? N  ?In the past six months, have you accidently leaked urine? N  ?Do you have problems with loss of bowel control? N  ?Managing your Medications? N  ?Managing your Finances? N  ?Housekeeping or managing your Housekeeping? N  ? ? ?Patient Care Team: ?Lauree Chandler, NP as PCP - General (Geriatric Medicine) ?Kate Sable, MD as PCP - Cardiology (Cardiology) ? ?Indicate any recent Medical Services you may have received from other than [facility] providers in the past year (date may be approximate). ? ?   ?Assessment:  ? This is a routine wellness examination for [facility]. ? ?Hearing/Vision screen ?Hearing Screening - Comments:: Patient wears hearing aids. ?Vision Screening - Comments:: Patient has no vision problems. Patient had eye exam August 2022. Patient sees Dr. Matilde Sprang ? ?Dietary issues and exercise activities discussed: ?Current Exercise Habits: Home exercise routine, Type of exercise: walking;stretching, Time (Minutes): 40,  Frequency (Times/Week): 7, Weekly Exercise (Minutes/Week): 280 ? ? Goals Addressed   ?None ?  ? ?Depression Screen ? ?  11/25/2021  ?  1:52 PM  ?PHQ 2/9 Scores  ?PHQ - 2 Score 0  ?  ?Fall Risk ? ?  11/25/2021  ?  1:52 PM 10/07/2021  ? 10:11 AM 08/28/2021  ? 10:11 AM 06/05/2021  ? 10:43 AM 03/04/2021  ?  3:21 PM  ?Fall Risk   ?Falls in the past year? 0 1 0 0 1  ?Number falls in past yr: 0 1 0 0 1  ?Injury with Fall? 0 1 0 0 1  ?Risk for fall due to : No Fall Risks  History of fall(s) No Fall Risks No Fall Risks History of fall(s)  ?Follow up Falls evaluation completed  Falls evaluation completed Falls evaluation completed Falls evaluation completed  ? ? ?FALL RISK PREVENTION PERTAINING TO THE HOME: ? ?Any stairs in or around the home? Yes  ?If so, are there any without handrails? No  ?Home free of loose throw rugs in walkways, pet beds, electrical cords, etc? Yes  ?Adequate lighting in your home to reduce risk of falls? Yes  ? ?ASSISTIVE DEVICES UTILIZED TO PREVENT FALLS: ? ?Life alert? Yes  ?Use of a cane, walker or w/c? No  ?Grab bars in the bathroom? Yes  ?Shower chair or bench in shower? No  ?Elevated toilet seat or a handicapped toilet? Yes  ? ?TIMED UP AND GO: ? ?Was the test performed? No .  ? ?Cognitive Function: ?  ?  ? ?  11/25/2021  ?  1:55 PM  ?6CIT Screen  ?What Year? 0 points  ?What month? 0 points  ?What time? 0 points  ?Count back from 20 0 points  ?Months in reverse 0 points  ?Repeat phrase 0 points  ?Total Score 0 points  ? ? ?Immunizations ?Immunization History  ?Administered Date(s) Administered  ? DTaP 05/20/2017  ? Fluad Quad(high Dose 65+) 05/20/2017  ? Influenza Inj Mdck Quad Pf 05/31/2018, 05/31/2019  ? Influenza, High Dose Seasonal PF 07/28/2010, 06/24/2011, 05/29/2013, 07/31/2014  ? Influenza-Unspecified 07/04/2007, 05/20/2017, 06/01/2020  ? Moderna Sars-Covid-2 Vaccination 09/05/2019, 10/03/2019, 07/09/2020, 01/09/2021  ? Pension scheme manager 41yrs & up 05/15/2021  ? Pneumococcal  Conjugate-13 08/20/2015  ? Pneumococcal Polysaccharide-23 07/28/2010, 07/28/2010  ? Tdap 01/22/2017, 05/20/2017  ? ? ?TDAP status: Up to date ? ?Flu Vaccine status: Up to date ? ?Pneumococcal vaccine status: U

## 2021-12-09 ENCOUNTER — Other Ambulatory Visit: Payer: Self-pay | Admitting: Nurse Practitioner

## 2021-12-09 DIAGNOSIS — E782 Mixed hyperlipidemia: Secondary | ICD-10-CM

## 2021-12-16 ENCOUNTER — Other Ambulatory Visit: Payer: Self-pay | Admitting: Nurse Practitioner

## 2022-01-19 DIAGNOSIS — M1711 Unilateral primary osteoarthritis, right knee: Secondary | ICD-10-CM | POA: Insufficient documentation

## 2022-01-20 ENCOUNTER — Telehealth: Payer: Self-pay | Admitting: Cardiology

## 2022-01-20 NOTE — Telephone Encounter (Signed)
   Pre-operative Risk Assessment    Patient Name: Eduarda Scrivens  DOB: [birth year] MRN: [id number]      Request for Surgical Clearance    Procedure:   Right total knee arthroplasty  Date of Surgery:  Clearance TBD                                 Surgeon:  Hooten  Surgeon's Group or Practice Name:  [facility]  Phone number:  [phone] Fax number:  [phone]   Type of Clearance Requested:   - Medical  - Pharmacy:  Hold Apixaban (Eliquis) 3 days prior    Type of Anesthesia:   Regional    Additional requests/questions:   Please advise if pre/ peri  procedure recommendations or contraindications advised  Jonathon Jordan   01/20/2022, 7:35 AM

## 2022-01-20 NOTE — Telephone Encounter (Signed)
Patient with diagnosis of afib on Eliquis for anticoagulation.    Procedure: right TKA  Date of procedure: TBD  CHA2DS2-VASc Score = 7  This indicates a 11.2% annual risk of stroke. The patient's score is based upon: CHF History: 0 HTN History: 1 Diabetes History: 0 Stroke History: 2 Vascular Disease History: 1 Age Score: 2 Gender Score: 1  Pt does not have DM, this has been removed from her previously calculated CHADS2VASc score.  CrCl 5mL/min Platelet count 412K  TKA requires 3 day Eliquis hold. Pt has elevated CHADS2VASc score of 7 including prior TIA although also has poor renal function and advanced age. Last clearance in Dec 2022 was routed to MD regarding potential Lovenox and MD preferred Lovenox bridge, however pt did not want to give injections and canceled her procedure at that time. Injections would need to be discussed with pt again. Per most recent cards note, pt thinks she had another TIA in early January 2023.

## 2022-01-21 NOTE — Telephone Encounter (Signed)
S/w the pt about scheduling an appt in office for pre op clearance. Pt tells me she is not home right now. Instructed pt to call back and let the operator know she needs appt for pre op clearance with Dr. Charlestine Night or APP. While I was giving the pt the main ph# and the # for the [place] office she hung up.

## 2022-01-22 NOTE — Telephone Encounter (Signed)
Pt has appt 03/03/22 with Christell Faith, [facility] for pre op assessment. I will send notes PAC for upcoming appt. Will send FYI to requesting office the pt has appt 03/03/22.

## 2022-02-02 ENCOUNTER — Ambulatory Visit
Admission: RE | Admit: 2022-02-02 | Discharge: 2022-02-02 | Disposition: A | Discharge status: Home / Self Care | Payer: Medicare Other | Source: Ambulatory Visit | Attending: Nurse Practitioner | Admitting: Nurse Practitioner

## 2022-02-02 DIAGNOSIS — E2839 Other primary ovarian failure: Secondary | ICD-10-CM | POA: Diagnosis present

## 2022-02-03 ENCOUNTER — Telehealth: Payer: Self-pay

## 2022-02-03 NOTE — Telephone Encounter (Signed)
-----   Message from Lauree Chandler, NP sent at 02/03/2022  9:05 AM EDT ----- Bone density shows osteoporosis, (significant thinning of the bone) we can discuss treatment options for this if she is interested. Recommended to take calcium 600 mg twice daily with Vitamin D 2000 units daily and weight bearing activity 30 mins/5 days a week

## 2022-02-03 NOTE — Telephone Encounter (Signed)
Discussed results with patient, patient verbalized understanding of results  Patient states she is 88 years old and not interested in taking a prescribed medication for her bones at this time.  I verbally informed Janett Billow of this interaction as she was sitting near me at the time of call.

## 2022-02-22 NOTE — Discharge Instructions (Signed)
Instructions after Total Knee Replacement   Brittney Simonin [name], [name], M.D.     Dept. of Orthopaedics & Sports Medicine  [facility]  [address]  [place], [place]  [address]  Phone: [phone]   Fax: [phone]    DIET: Drink plenty of non-alcoholic fluids. Resume your normal diet. Include foods high in fiber.  ACTIVITY:  You may use crutches or a walker with weight-bearing as tolerated, unless instructed otherwise. You may be weaned off of the walker or crutches by your Physical Therapist.  Do NOT place pillows under the knee. Anything placed under the knee could limit your ability to straighten the knee.   Continue doing gentle exercises. Exercising will reduce the pain and swelling, increase motion, and prevent muscle weakness.   Please continue to use the TED compression stockings for 6 weeks. You may remove the stockings at night, but should reapply them in the morning. Do not drive or operate any equipment until instructed.  WOUND CARE:  Continue to use the PolarCare or ice packs periodically to reduce pain and swelling. You may bathe or shower after the staples are removed at the first office visit following surgery.  MEDICATIONS: You may resume your regular medications. Please take the pain medication as prescribed on the medication. Do not take pain medication on an empty stomach. You have been given a prescription for a blood thinner (Lovenox or Coumadin). Please take the medication as instructed. (NOTE: After completing a 2 week course of Lovenox, take one Enteric-coated aspirin once a day. This along with elevation will help reduce the possibility of phlebitis in your operated leg.) Do not drive or drink alcoholic beverages when taking pain medications.  CALL THE OFFICE FOR: Temperature above 101 degrees Excessive bleeding or drainage on the dressing. Excessive swelling, coldness, or paleness of the toes. Persistent nausea and vomiting.  FOLLOW-UP:  You  should have an appointment to return to the office in 10-14 days after surgery. Arrangements have been made for continuation of Physical Therapy (either home therapy or outpatient therapy).   [facility] Department Directory         [email or web address]       [email or web address]          Cardiology  Appointments: [place] - [phone] [place] - [phone]  Endocrinology  Appointments: [place] - [phone] [place] - [phone]  Gastroenterology  Appointments: [place] - [phone] [place] - [phone]        General Surgery   Appointments: [place] - [phone]  Internal Medicine/Family Medicine  Appointments: [place] - [phone] [place] - [phone] [place] - [phone]  Metabolic and Weigh Loss Surgery  Appointments: [place] - [phone]        Neurology  Appointments: [place] - [phone] [place] - [phone]  Neurosurgery  Appointments: [place] - [phone]  Obstetrics & Gynecology  Appointments: [place] - [phone] [place] - [phone]        Pediatrics  Appointments: [place] - [phone] [place] - [phone]  Physiatry  Appointments: [place] -[phone]  Physical Therapy  Appointments: [place] - [phone] [place] - [phone]        Podiatry  Appointments: [place] - [phone] [place] - [phone]  Pulmonology  Appointments: [place] - [phone]  Rheumatology  Appointments: [place] - [phone]        [place] Location: [facility]  [address] [place], [place]  [address]  [place] Location: [facility] [address] [place],   [address]  [place] Location: [facility] [address] [place],   [address]    

## 2022-02-27 ENCOUNTER — Encounter
Admission: RE | Admit: 2022-02-27 | Discharge: 2022-02-27 | Disposition: A | Discharge status: Home / Self Care | Payer: Medicare Other | Source: Ambulatory Visit | Attending: Orthopedic Surgery | Admitting: Orthopedic Surgery

## 2022-02-27 ENCOUNTER — Other Ambulatory Visit: Payer: Self-pay

## 2022-02-27 VITALS — BP 118/67 | HR 74 | Resp 16 | Ht 59.0 in | Wt 149.0 lb

## 2022-02-27 DIAGNOSIS — Z01818 Encounter for other preprocedural examination: Secondary | ICD-10-CM | POA: Insufficient documentation

## 2022-02-27 DIAGNOSIS — Z01810 Encounter for preprocedural cardiovascular examination: Secondary | ICD-10-CM | POA: Diagnosis not present

## 2022-02-27 DIAGNOSIS — I2510 Atherosclerotic heart disease of native coronary artery without angina pectoris: Secondary | ICD-10-CM | POA: Insufficient documentation

## 2022-02-27 DIAGNOSIS — M1712 Unilateral primary osteoarthritis, left knee: Secondary | ICD-10-CM | POA: Diagnosis not present

## 2022-02-27 DIAGNOSIS — E785 Hyperlipidemia, unspecified: Secondary | ICD-10-CM | POA: Insufficient documentation

## 2022-02-27 DIAGNOSIS — N1831 Chronic kidney disease, stage 3a: Secondary | ICD-10-CM | POA: Insufficient documentation

## 2022-02-27 HISTORY — DX: Diaphragmatic hernia without obstruction or gangrene: K44.9

## 2022-02-27 HISTORY — DX: Gout, unspecified: M10.9

## 2022-02-27 HISTORY — DX: Presence of external hearing-aid: Z97.4

## 2022-02-27 HISTORY — DX: Presence of dental prosthetic device (complete) (partial): Z97.2

## 2022-02-27 HISTORY — DX: Anemia, unspecified: D64.9

## 2022-02-27 HISTORY — DX: Gastro-esophageal reflux disease without esophagitis: K21.9

## 2022-02-27 HISTORY — DX: Unspecified osteoarthritis, unspecified site: M19.90

## 2022-02-27 HISTORY — DX: Long term (current) use of anticoagulants: Z79.01

## 2022-02-27 HISTORY — DX: Unspecified cataract: H26.9

## 2022-02-27 HISTORY — DX: Atherosclerosis of aorta: I70.0

## 2022-02-27 HISTORY — DX: Atherosclerotic heart disease of native coronary artery without angina pectoris: I25.10

## 2022-02-27 HISTORY — DX: Chronic kidney disease, stage 3 unspecified: N18.30

## 2022-02-27 LAB — SEDIMENTATION RATE: Sed Rate: 9 mm/hr (ref 0–30)

## 2022-02-27 LAB — TYPE AND SCREEN
ABO/RH(D): A POS
Antibody Screen: NEGATIVE

## 2022-02-27 LAB — COMPREHENSIVE METABOLIC PANEL
ALT: 10 U/L (ref 0–44)
AST: 17 U/L (ref 15–41)
Albumin: 3.9 g/dL (ref 3.5–5.0)
Alkaline Phosphatase: 49 U/L (ref 38–126)
Anion gap: 7 (ref 5–15)
BUN: 34 mg/dL — ABNORMAL HIGH (ref 8–23)
CO2: 23 mmol/L (ref 22–32)
Calcium: 9.4 mg/dL (ref 8.9–10.3)
Chloride: 110 mmol/L (ref 98–111)
Creatinine, Ser: 1.47 mg/dL — ABNORMAL HIGH (ref 0.44–1.00)
GFR, Estimated: 34 mL/min — ABNORMAL LOW (ref >60)
Glucose, Bld: 98 mg/dL (ref 70–99)
Potassium: 4.2 mmol/L (ref 3.5–5.1)
Sodium: 140 mmol/L (ref 135–145)
Total Bilirubin: 1.2 mg/dL (ref 0.3–1.2)
Total Protein: 6.4 g/dL — ABNORMAL LOW (ref 6.5–8.1)

## 2022-02-27 LAB — C-REACTIVE PROTEIN: CRP: 0.7 mg/dL (ref <1.0)

## 2022-02-27 LAB — URINALYSIS, ROUTINE W REFLEX MICROSCOPIC
Bilirubin Urine: NEGATIVE
Glucose, UA: NEGATIVE mg/dL
Hgb urine dipstick: NEGATIVE
Ketones, ur: NEGATIVE mg/dL
Nitrite: NEGATIVE
Protein, ur: NEGATIVE mg/dL
Specific Gravity, Urine: 1.018 (ref 1.005–1.030)
pH: 5 (ref 5.0–8.0)

## 2022-02-27 LAB — SURGICAL PCR SCREEN
MRSA, PCR: NEGATIVE
Staphylococcus aureus: NEGATIVE

## 2022-02-27 LAB — CBC
HCT: 37.3 % (ref 36.0–46.0)
Hemoglobin: 11.9 g/dL — ABNORMAL LOW (ref 12.0–15.0)
MCH: 31.4 pg (ref 26.0–34.0)
MCHC: 31.9 g/dL (ref 30.0–36.0)
MCV: 98.4 fL (ref 80.0–100.0)
Platelets: 360 10*3/uL (ref 150–400)
RBC: 3.79 MIL/uL — ABNORMAL LOW (ref 3.87–5.11)
RDW: 12.9 % (ref 11.5–15.5)
WBC: 6.8 10*3/uL (ref 4.0–10.5)
nRBC: 0 % (ref 0.0–0.2)

## 2022-02-27 NOTE — Patient Instructions (Signed)
Your procedure is scheduled on: 03/11/22 Report to [place]. To find out your arrival time please call [phone] between 1PM - 3PM on 03/10/22.  Remember: Instructions that are not followed completely may result in serious medical risk, up to and including death, or upon the discretion of your surgeon and anesthesiologist your surgery may need to be rescheduled.     _X__ 1. Do not eat food after midnight the night before your procedure.                 No gum chewing or hard candies. You may drink clear liquids up to 2 hours                 before you are scheduled to arrive for your surgery- DO not drink clear                 liquids within 2 hours of the start of your surgery.                 Clear Liquids include:  water, apple juice without pulp, clear carbohydrate                 drink such as Clearfast or Gatorade, Black Coffee or Tea (Do not add                 anything to coffee or tea). Diabetics water only  2 hours before arriving to surgery you should drink the ENSURE PRE SURGERY DRINK provided  __X__2.  On the morning of surgery brush your teeth with toothpaste and water, you                 may rinse your mouth with mouthwash if you wish.  Do not swallow any              toothpaste of mouthwash.     _X__ 3.  No Alcohol for 24 hours before or after surgery.   _X__ 4.  Do Not Smoke or use e-cigarettes For 24 Hours Prior to Your Surgery.                 Do not use any chewable tobacco products for at least 6 hours prior to                 surgery.  ____  5.  Bring all medications with you on the day of surgery if instructed.   __X__  6.  Notify your doctor if there is any change in your medical condition      (cold, fever, infections).     Do not wear jewelry, make-up, hairpins, clips or nail polish. Do not wear lotions, powders, or perfumes.  Do not shave body hair 48 hours prior to surgery. Men may shave face and  neck. Do not bring valuables to the hospital.    [facility] is not responsible for any belongings or valuables.  Contacts, dentures/partials or body piercings may not be worn into surgery. Bring a case for your contacts, glasses or hearing aids, a denture cup will be supplied. Leave your suitcase in the car. After surgery it may be brought to your room. For patients admitted to the hospital, discharge time is determined by your treatment team.   Patients discharged the day of surgery will not be allowed to drive home.   Please read over the following fact sheets that you were given:  MRSA Information, CHG soap, Incentive Spirometer  __X__ Take these medicines the morning of surgery with A SIP OF WATER:    1. loratadine (CLARITIN) 10 MG tablet  2. metoprolol succinate (TOPROL-XL) 25 MG 24 hr tablet  3.   4.  5.  6.  ____ Fleet Enema (as directed)   __X__ Use CHG Soap/SAGE wipes as directed  ____ Use inhalers on the day of surgery  ____ Stop metformin/Janumet/Farxiga 2 days prior to surgery    ____ Take 1/2 of usual insulin dose the night before surgery. No insulin the morning          of surgery.   __X__ Stop Blood Thinners Eliquis 3 DAYS BEFORE SURGERY, LAST DOSE IS 03/08/22  __X__ Stop Anti-inflammatories 7 days before surgery such as Advil, Ibuprofen, Motrin,  BC or Goodies Powder, Naprosyn, Naproxen, Aleve   __X__ Stop YOUR VITAMINS ON 03/04/22   ____ Bring C-Pap to the hospital.

## 2022-03-02 ENCOUNTER — Encounter: Payer: Self-pay | Admitting: Orthopedic Surgery

## 2022-03-02 NOTE — Progress Notes (Incomplete)
Perioperative Services  Pre-Admission/Anesthesia Testing Clinical Review  Date: 03/05/22  Patient Demographics:  Name: Brittney [name] DOB:   [birth year] MRN:   [id number]  Planned Surgical Procedure(s):    Case: [id number] Date/Time: 03/11/22 1113   Procedure: COMPUTER ASSISTED TOTAL KNEE ARTHROPLASTY (Right: Knee)   Anesthesia type: Choice   Pre-op diagnosis: PRIMARY OSTEOARTHRITIS OF RIGHT KNEE.   Location: [facility] OR ROOM 01 / [place] [facility] ANEST[facility]   Surgeons: Dereck Leep, MD   NOTE: Available PAT nursing documentation and vital signs have been reviewed. Clinical nursing staff has updated patient's PMH/PSHx, current medication list, and drug allergies/intolerances to ensure comprehensive history available to assist in medical decision making as it pertains to the aforementioned surgical procedure and anticipated anesthetic course. Extensive review of available clinical information performed. [facility] and PSHx updated with any diagnoses/procedures that  may have been inadvertently omitted during her intake with the pre-admission testing department's nursing staff.  Clinical Discussion:  Brittney [name] is a 88 y.o. female who is submitted for pre-surgical anesthesia review and clearance prior to her undergoing the above procedure. Patient has never been a smoker. Pertinent PMH includes: CAD, angina, atrial fibrillation, LAFB, TIA x 2, diastolic dysfunction, aortic atherosclerosis, BILATERAL carotid artery disease, LEFT carotid bruit, HTN, HLD, CKD-IV, GERD (no daily Tx), hiatal hernia, anemia, OA, lumbar spinal stenosis.  Patient is followed by cardiology [name], MD). She was last seen in the cardiology clinic on 03/05/2022; notes reviewed.  At the time of her clinic visit, patient doing well overall from a cardiovascular perspective.  Patient denies any episodes of chest pain,  PND, orthopnea, palpitations, significant peripheral edema, vertiginous symptoms, or  presyncope/syncope.  Patient did endorse some episodes of intermittent exertional dyspnea associated with ambulation on an incline. Patient with past medical history significant for cardiovascular diagnoses.  Of note, patient has received cardiovascular care at [facility] in [place].   Patient underwent diagnostic left heart catheterization on 02/09/2008 revealing a normal left ventricular systolic function with an EF of 60%.  LVEDP mildly elevated at 25 mmHg.  Coronary anatomy normal with no evidence of obstructive CAD.  Coronary CTA performed on 11/27/2011 revealing an elevated calcium score of 129.73.  ASCVD noted and the following distributions:  RCA = 1.17 LM = 16.36 LAD = 112.21 LCx = 0  Myocardial perfusion imaging study performed on 03/13/2014 revealing a normal left ventricular systolic function with an EF of 65%.  Left ventricular cavity size normal both at rest and with exertion. TID normal at 1.08.  There was no evidence of stress-induced myocardial ischemia or arrhythmia; no scintigraphic evidence of scar.   Most recent TTE was performed on 08/11/2021 revealing a mildly reduced left ventricular systolic function with an EF of 45-50%.  There were no regional wall motion abnormalities.  Diastolic Doppler parameters indeterminate.  RV SF normal.  Left atrium severely dilated.  Right atrium mildly dilated.  There was mild to moderate mitral and tricuspid valve regurgitation.  There was no evidence of a significant transvalvular gradient to suggest stenosis.  Patient with an atrial fibrillation diagnosis; CHA2DS2-VASc Score = 7 (age x 2, sex, HTN, TIA x 2, aortic plaque). Her rate and rhythm are currently being maintained on oral metoprolol succinate. She is chronically anticoagulated using dose reduced apixaban; reported to be compliant with therapy with no evidence or reports of GI bleeding.  Blood pressure mildly elevated at 140/78 on currently prescribed beta-blocker monotherapy. She  is on a statin (rosuvastatin) for her HLD  diagnosis and further ASCVD prevention.  Patient is not diabetic.  Patient reported to be appropriately active at baseline.  She was able to complete all ADLs/IADLs independently, and even reports walking her dog multiple times a day without experiencing any angina/anginal equivalent symptoms.  Functional capacity, as defined by DASI, is documented as being >/= 4 METS.  ECG in the office revealed atrial fibrillation at a controlled rate of 75 bpm, LAFB, LVH, and poor precordial R wave progression.  No changes were made to her medication regimen.  Patient follow-up with outpatient cardiology in 6 months or sooner if needed.  Brittney [name] is scheduled for an elective RIGHT COMPUTER ASSISTED TOTAL KNEE ARTHROPLASTY on 03/11/2022 with Dr. Skip Estimable, MD. Given patient's past medical history significant for cardiovascular and neurological diagnoses, presurgical clearances were sought from patient's primary cardiologist and neurologist.  Specialty clearances were obtained as follows:  Per neurology [name], MD), "transient speech impairment associated with headache and 07/2018 and in 08/2021 and patient with stroke risk factors of high cholesterol, HTN, atrial fibrillation, and family history of strokes (brother). Concerning for TIA vs nonconvulsive seizures vs migraine accompaniment. Patient likely MODERATE to HIGH risk for surgery from a neurological perspective".  Per cardiology [name], PA-C), "she is well compensated and without symptoms concerning for angina.  From a cardiac perspective, she is low risk for noncardiac surgery per RCRI, and she can achieve greater than 4 METs without cardiac limitation.  She may proceed with noncardiac surgery without further testing at an overall LOW risk from our perspective".    In review of her medication reconciliation, it is noted the patient is on antiplatelet and dose reduced anticoagulation therapies. She has been  instructed on recommendations from her cardiologist for holding her daily apixaban for 3 days prior to her procedure with plans to restart as soon as postoperative bleeding risk felt to be minimized by her primary attending surgeon. The patient is aware that her last dose of apixaban should be on 03/07/2022. She will continue her ASA throughout her perioperative course. Of note, enoxaparin bridging recommended by cardiology, however patient ultimately declined recommendation when discussed with her by her cardiology team. Patient denies previous perioperative complications with anesthesia in the past. In review of the available records, it is noted that patient underwent a neuraxial anesthetic course at [facility] in [place] (ASA III) in 04/2014 without documented complications.      03/05/2022    8:41 AM 02/27/2022    1:34 PM 10/07/2021   10:12 AM  Vitals with BMI  Height 4\' 11"  4\' 11"  5\' 0"   Weight 148 lbs 10 oz 149 lbs 146 lbs  BMI 30 75.10 25.85  Systolic 277 824 235  Diastolic 78 67 70  Pulse  74 73    Providers/Specialists:   NOTE: Primary physician provider listed below. Patient may have been seen by APP or partner within same practice.   PROVIDER ROLE / SPECIALTY LAST OV  Hooten, Laurice Record, MD Orthopedics (Surgeon) 01/16/2022  Lauree Chandler, NP Primary Care Provider 11/18/2021  Kate Sable, MD Cardiology 03/05/2022  Jennings Books, MD Neurology 02/25/2022   Allergies:  Lisinopril, Mixed grasses, Bee venom, Azithromycin, Baclofen, Iodine, Morphine and related, and Sulfa antibiotics  Current Home Medications:   No current facility-administered medications for this encounter.    acetaminophen (TYLENOL) 500 MG tablet   apixaban (ELIQUIS) 2.5 MG TABS tablet   aspirin EC 81 MG tablet   calcium carbonate (OSCAL) 1500 (600 Ca) MG TABS tablet  Cholecalciferol (VITAMIN D) 50 MCG (2000 UT) tablet   EPINEPHrine 0.3 mg/0.3 mL IJ SOAJ injection   fluticasone (FLONASE) 50  MCG/ACT nasal spray   loratadine (CLARITIN) 10 MG tablet   metoprolol succinate (TOPROL-XL) 25 MG 24 hr tablet   Olopatadine HCl (PAZEO) 0.7 % SOLN   rosuvastatin (CRESTOR) 10 MG tablet   triamcinolone cream (KENALOG) 0.1 %   vitamin B-12 (CYANOCOBALAMIN) 1000 MCG tablet   History:   Past Medical History:  Diagnosis Date   Adopted person    Allergy    Anemia    Angina pectoris (HCC)    Aortic atherosclerosis (HCC)    Arthritis    Atrial fibrillation (HCC)    a. ) CHA2DS2-VASc = 7 (age x 2, sex, HTN, TIA x2, aortic plaque);  b.) rate/rhythm maintained on oral metoprolol succinate; chronically anticoagulated using dose reduced apixaban.   Bilateral carotid artery disease (HCC)    CAD (coronary artery disease)    a.) LHC 02/09/2007: EF 60%, LVEDP 25 mmHg, normal cors; b.) cCTA 11/27/2011: Ca score 129.73 --> 1.17 RCA, 16.36 LM, 112.21 LAD, 0 LCx; c.) MPI 03/13/2014 - normal.   Cataract    CKD (chronic kidney disease), stage IV (HCC)    Diastolic dysfunction 70/08/7492   a.) LHC 02/09/2008: EF 60%, LVEDP 25 mmHg; b.) TTE 08/07/2010: EF 65%, mild LVH, mild MR, G2DD; b.) TTE 08/20/2018: EF 45-50%, inferior HK, mild LAE; c.) TTE 08/11/2021: EF 45-50%. 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   Left carotid bruit    Long term current use of anticoagulant    a.) apixaban   Lumbar stenosis    Lymphocytic colitis    Osteoporosis    TIA (transient ischemic attack) 07/2018   TIA (transient ischemic attack) 08/2021   Vitamin B12 deficiency    Wears hearing BILATERAL aids    Wears partial (bottom) dentures    Past Surgical History:  Procedure Laterality Date   BREAST EXCISIONAL BIOPSY Right 36 years ago   neg   BUNIONECTOMY Bilateral    COLONOSCOPY N/A 07/02/2008   KNEE ARTHROSCOPY Right 2006   LEFT HEART CATH AND  CORONARY ANGIOGRAPHY Left 02/09/2007   Procedure: LEFT HEART CATH AND CORONARY ANGIOGRAPHY; Location: [facility]   TONSILLECTOMY AND ADENOIDECTOMY Bilateral    TOTAL ABDOMINAL HYSTERECTOMY W/ BILATERAL SALPINGOOPHORECTOMY N/A    TOTAL KNEE ARTHROPLASTY Left 04/26/2014   TUBAL LIGATION     Family History  Adopted: Yes  Problem Relation Age of Onset   Stroke Brother    Social History   Tobacco Use   Smoking status: Never   Smokeless tobacco: Never  Vaping Use   Vaping Use: Never used  Substance Use Topics   Alcohol use: Not Currently   Drug use: Never    Pertinent Clinical Results:  LABS: Labs reviewed: Acceptable for surgery.  Component Date Value Ref Range Status   ABO/RH(D) 02/27/2022 A POS   Final   Antibody Screen 02/27/2022 NEG   Final   Sample Expiration 02/27/2022 03/13/2022,2359   Final   Extend sample reason 02/27/2022    Final                   Value:NO TRANSFUSIONS OR PREGNANCY IN THE PAST 3 MONTHS Performed at [facility], [place]  [address] [place], [place] [address]    MRSA, PCR 02/27/2022 NEGATIVE  NEGATIVE Final   Staphylococcus aureus 02/27/2022 NEGATIVE  NEGATIVE Final   Comment: (NOTE) The Xpert SA Assay (FDA approved for NASAL specimens in patients 75 years of age and older), is one component of a comprehensive surveillance program. It is not intended to diagnose infection nor to guide or monitor treatment. Performed at [facility], [place], [place] [address]   CRP 02/27/2022 0.7  <1.0 mg/dL Final   Performed at [facility] [address] [place], [place] [address]   Sed Rate 02/27/2022 9  0 - 30 mm/hr Final   Performed at [facility], [place], [place] [address]   WBC 02/27/2022 6.8  4.0 - 10.5 K/uL Final   RBC 02/27/2022 3.79 (L)  3.87 - 5.11 MIL/uL Final   Hemoglobin 02/27/2022 11.9 (L)  12.0 - 15.0 g/dL Final   HCT 02/27/2022 37.3  36.0 - 46.0 % Final   MCV 02/27/2022 98.4   80.0 - 100.0 fL Final   MCH 02/27/2022 31.4  26.0 - 34.0 pg Final   MCHC 02/27/2022 31.9  30.0 - 36.0 g/dL Final   RDW 02/27/2022 12.9  11.5 - 15.5 % Final   Platelets 02/27/2022 360  150 - 400 K/uL Final   nRBC 02/27/2022 0.0  0.0 - 0.2 % Final   Performed at [facility], [place], [place] [address]   Sodium 02/27/2022 140  135 - 145 mmol/L Final   Potassium 02/27/2022 4.2  3.5 - 5.1 mmol/L Final   Chloride 02/27/2022 110  98 - 111 mmol/L Final   CO2 02/27/2022 23  22 - 32 mmol/L Final   Glucose, Bld 02/27/2022 98  70 - 99 mg/dL Final   Glucose reference range applies only to samples taken after fasting for at least 8 hours.   BUN 02/27/2022 34 (H)  8 - 23 mg/dL Final   Creatinine, Ser 02/27/2022 1.47 (H)  0.44 - 1.00 mg/dL Final   Calcium 02/27/2022 9.4  8.9 - 10.3 mg/dL Final   Total Protein 02/27/2022 6.4 (L)  6.5 - 8.1 g/dL Final   Albumin 02/27/2022 3.9  3.5 - 5.0 g/dL Final   AST 02/27/2022 17  15 - 41 U/L Final   ALT 02/27/2022 10  0 - 44 U/L Final   Alkaline Phosphatase 02/27/2022 49  38 - 126 U/L Final   Total Bilirubin 02/27/2022 1.2  0.3 - 1.2 mg/dL Final   GFR, Estimated 02/27/2022 34 (L)  >60 mL/min Final   Comment: (NOTE) Calculated using the CKD-EPI Creatinine Equation (2021)   Anion gap 02/27/2022 7  5 - 15 Final   Performed at [facility], [place], [place] [address]   Color, Urine 02/27/2022 YELLOW (A)  YELLOW Final   APPearance 02/27/2022 HAZY (A)  CLEAR Final   Specific Gravity, Urine 02/27/2022 1.018  1.005 - 1.030 Final   pH 02/27/2022 5.0  5.0 - 8.0 Final   Glucose, UA 02/27/2022 NEGATIVE  NEGATIVE mg/dL Final   Hgb urine dipstick 02/27/2022 NEGATIVE  NEGATIVE Final   Bilirubin Urine 02/27/2022 NEGATIVE  NEGATIVE Final   Ketones, ur 02/27/2022 NEGATIVE  NEGATIVE mg/dL Final   Protein, ur 02/27/2022 NEGATIVE  NEGATIVE mg/dL Final   Nitrite 02/27/2022 NEGATIVE  NEGATIVE Final   Leukocytes,Ua 02/27/2022  MODERATE (A)  NEGATIVE Final   RBC / HPF 02/27/2022 0-5  0 - 5  RBC/hpf Final   WBC, UA 02/27/2022 21-50  0 - 5 WBC/hpf Final   Bacteria, UA 02/27/2022 RARE (A)  NONE SEEN Final   Squamous Epithelial / LPF 02/27/2022 0-5  0 - 5 Final   Mucus 02/27/2022 PRESENT   Final   Performed at [facility], [address] [place], [place] [address]    ECG: Date: 02/27/2022 Time ECG obtained: 1306 PM Rate: 75 bpm Rhythm:  atrial fibrillation; LAFB Axis (leads I and aVF): Left Intervals: QRS 108 ms. QTc 426 ms. ST segment and T wave changes: No evidence of acute ST segment elevation or depression Comparison: Similar to previous tracing obtained on 09/08/2021   IMAGING / PROCEDURES: X-RAY KNEE RIGHT 3 VIEWS performed on 01/09/2022 Moderate to severe loss of medial compartment joint space with osteophyte formation No fractures or dislocations noted No other osseous abnormality noted  X-RAY KNEE LEFT 3 VIEWS performed on 01/09/2022 Images reveal a Sigma LEFT total knee arthroplasty without signs of loosening, migration, or failure. No periprosthetic lucency or periprosthetic fractures noted.  TRANSTHORACIC ECHOCARDIOGRAM performed on 08/11/2021 Left ventricular ejection fraction, by estimation, is 45 to 50%. The left ventricle has mildly decreased function. The left ventricle has no regional wall motion abnormalities. Left ventricular diastolic parameters are indeterminate.  Right ventricular systolic function is normal. The right ventricular size is normal.  Left atrial size was severely dilated.  Right atrial size was mildly dilated.  The mitral valve is normal in structure. Mild to moderate mitral valve regurgitation.  Tricuspid valve regurgitation is mild to moderate.  The aortic valve is tricuspid. Aortic valve regurgitation is not visualized.  The inferior vena cava is normal in size with <50% respiratory variability, suggesting right atrial pressure of 8 mmHg.   MYOCARDIAL  PERFUSION IMAGING STUDY (LEXISCAN) performed on 03/13/2014 Resting EKG: Sinus rhythm with normal conduction. There were no arrhythmias Stress EKG: Sinus rhythm with normal conduction no ST/T wave changes.There were no arrhythmias  The patient had dyspnea  Overall quality of the study Good.  There is not any  soft tissue attenuation  Left ventricular cavity is normal  at rest.  Left ventricular cavity remains unchanged with stress. The TID (transient dilation index) is 1.08. Right ventricular cavity is normal Stress images show no myocardial perfusion defect.    Left ventricular function is  normal and Left ventricular ejection fraction is  65 %   CT HEART (CALCIUM SCORING) performed on 11/27/2011 Total calcium score: 129.73.  Note is made of mitral valvular annular calcification. Lungs: No pulmonary parenchymal abnormality is seen in the field-of-view.  Pleural space: No pleural abnormality is seen in the field-of-view.  Mediastinum: 3 cm to 4 cm hiatal hernia. No hilar or mediastinal mass is seen.  Bones/soft tissues: No significant osseous or peripheral soft tissue abnormality is seen.  LEFT HEART CATHETERIZATION AND CORONARY ANGIOGRAPHY performed on 67/89/3810 Normal LV systolic function with ejection fraction of 60% and normal wall motion.   Left ventricular end-diastolic pressure is elevated at 25 mmHg.  No angiographically apparent coronary disease in the major epicardial coronary arteries.  Hypertension and elevated left ventricular end-diastolic pressure may be consistent with diastolic dysfunction which may be a cause of the patient's dyspnea.    Impression and Plan:  Jiah Bari has been referred for pre-anesthesia review and clearance prior to her undergoing the planned anesthetic and procedural courses. Available labs, pertinent testing, and imaging results were personally reviewed by me. This patient has been appropriately cleared by cardiology (LOW)  and by neurology  (MODERATE to HIGH) with the individually indicated risk of significant perioperative complications.  Based on clinical review performed today (03/05/22), barring any significant acute changes in the patient's overall condition, it is anticipated that she will be able to proceed with the planned surgical intervention. Any acute changes in clinical condition may necessitate her procedure being postponed and/or cancelled. Patient will meet with anesthesia team (MD and/or CRNA) on the day of her procedure for preoperative evaluation/assessment. Questions regarding anesthetic course will be fielded at that time.   Pre-surgical instructions were reviewed with the patient during her PAT appointment and questions were fielded by PAT clinical staff. Patient was advised that if any questions or concerns arise prior to her procedure then she should return a call to PAT and/or her surgeon's office to discuss.  Honor Loh, MSN, APRN, FNP-C, CEN [facility]  Peri-operative Services Nurse Practitioner Phone: [phone] Fax: [phone] 03/05/22 10:19 AM  NOTE: This note has been prepared using Dragon dictation software. Despite my best ability to proofread, there is always the potential that unintentional transcriptional errors may still occur from this process.

## 2022-03-03 ENCOUNTER — Ambulatory Visit: Payer: Medicare Other | Admitting: Physician Assistant

## 2022-03-05 ENCOUNTER — Encounter: Payer: Self-pay | Admitting: Physician Assistant

## 2022-03-05 ENCOUNTER — Ambulatory Visit (INDEPENDENT_AMBULATORY_CARE_PROVIDER_SITE_OTHER): Payer: Medicare Other | Admitting: Physician Assistant

## 2022-03-05 VITALS — BP 140/78 | Ht 59.0 in | Wt 148.6 lb

## 2022-03-05 DIAGNOSIS — Z01810 Encounter for preprocedural cardiovascular examination: Secondary | ICD-10-CM | POA: Diagnosis not present

## 2022-03-05 DIAGNOSIS — I519 Heart disease, unspecified: Secondary | ICD-10-CM

## 2022-03-05 DIAGNOSIS — I4821 Permanent atrial fibrillation: Secondary | ICD-10-CM | POA: Diagnosis not present

## 2022-03-05 DIAGNOSIS — E785 Hyperlipidemia, unspecified: Secondary | ICD-10-CM

## 2022-03-05 DIAGNOSIS — I10 Essential (primary) hypertension: Secondary | ICD-10-CM | POA: Diagnosis not present

## 2022-03-05 DIAGNOSIS — G459 Transient cerebral ischemic attack, unspecified: Secondary | ICD-10-CM

## 2022-03-05 DIAGNOSIS — I2510 Atherosclerotic heart disease of native coronary artery without angina pectoris: Secondary | ICD-10-CM

## 2022-03-05 NOTE — Progress Notes (Signed)
Cardiology Office Note    Date:  03/05/2022   ID:  Brittney [name], DOB 1929-09-24, MRN [id number]  PCP:  Lauree Chandler, NP  Cardiologist:  Kate Sable, MD  Electrophysiologist:  None   Chief Complaint: Preoperative cardiac risk stratification  History of Present Illness:   Brittney [name] is a 88 y.o. female with history of permanent A-fib, systolic dysfunction, TIA, HTN, and HLD who presents for preoperative cardiac risk stratification for total knee arthroplasty.  She was previously followed by [facility], and underwent cardiac cath in 2008 which showed no angiographically apparent CAD in the major epicardial arteries.  Calcium score in 2013 of 129.73.  Nuclear stress test in 2015 showed no evidence of ischemia with normal LV function.  She was admitted to the hospital in 2019 with a suspected TIA.  Carotid artery ultrasound at that time showed no hemodynamically significant stenosis in the bilateral carotid arteries with antegrade flow of the bilateral vertebral arteries.  Echo demonstrated an EF of 45 to 50% with hypokinesis of the inferior myocardium and a mildly dilated left atrium.  Notes indicate she was diagnosed with A-fib in 10/2020 and initiated on apixaban.  She established care with our group in 2022.  Subsequent echo in 07/2021 showed a stable LV systolic function with an EF of 45 to 50%, no regional wall motion abnormalities, indeterminate LV diastolic function parameters, normal RV systolic function and ventricular cavity size, severely dilated left atrium, mildly dilated right atrium, mild to moderate mitral regurgitation, mild to moderate tricuspid regurgitation, and an estimated right atrial pressure of 8 mmHg.  She was last seen in our office in 08/2021 and reported a possible TIA several weeks prior.  She was initiated on aspirin with continuation of apixaban and referred to neurology with recommendation to be maintained on apixaban and aspirin.  MRI of the  brain showed no evidence of acute intracranial abnormality with mild to moderate chronic small vessel ischemic changes that had progressed from study in 2019 as well as mild generalized cerebral atrophy.  She is scheduled for a total knee arthroplasty on 03/11/2022.  She comes in doing well from a cardiac perspective, and is without symptoms of angina or decompensation.  She remains active at baseline, walking her dog multiple times per day without symptoms.  She does note some shortness of breath if she is walking up an incline at times.  She is largely limited by sciatica pain.  No falls, hematochezia, or melena.  No dizziness, presyncope, or syncope.  Revised Cardiac Risk Index: Low risk for noncardiac surgery [facility] Activity Status Index: > 4 METs without cardiac limitation    Labs independently reviewed: 02/2022 - potassium 4.2, BUN 34, serum creatinine 1.47, albumin 3.9, AST/ALT normal, Hgb 11.9, PLT 360 08/2021 - TSH normal 05/2021 - TC 121, TG 100, HDL 56, LDL 46  Past Medical History:  Diagnosis Date   Adopted person    Allergy    Anemia    Angina pectoris (HCC)    Aortic atherosclerosis (HCC)    Arthritis    Atrial fibrillation (HCC)    a. ) CHA2DS2-VASc = 7 (age x 2, sex, HTN, TIA x2, aortic plaque);  b.) rate/rhythm maintained on oral metoprolol succinate; chronically anticoagulated using dose reduced apixaban.   Bilateral carotid artery disease (HCC)    CAD (coronary artery disease)    a.) LHC 02/09/2007: EF 60%, LVEDP 25 mmHg, normal cors; b.) cCTA 11/27/2011: Ca score 129.73 --> 1.17 RCA, 16.36 LM, 112.21 LAD,  0 LCx; c.) MPI 03/13/2014 - normal.   Cataract    CKD (chronic kidney disease), stage IV (HCC)    Diastolic dysfunction 96/75/9163   a.) LHC 02/09/2008: EF 60%, LVEDP 25 mmHg; b.) TTE 08/07/2010: EF 65%, mild LVH, mild MR, G2DD; b.) TTE 08/20/2018: EF 45-50%, inferior HK, mild LAE; c.) TTE 08/11/2021: EF 45-50%. sev LAE, mild RAE, mild-mod MR/TR   GERD  (gastroesophageal reflux disease)    Gout    Hiatal hernia    Hyperlipidemia    Hypertension    Lactose intolerance    LAFB (left anterior fascicular block) 09/08/2021   Left carotid bruit    Long term current use of anticoagulant    a.) apixaban   Lumbar stenosis    Lymphocytic colitis    Osteoporosis    TIA (transient ischemic attack) 07/2018   TIA (transient ischemic attack) 08/2021   Vitamin B12 deficiency    Wears hearing BILATERAL aids    Wears partial (bottom) dentures     Past Surgical History:  Procedure Laterality Date   BREAST EXCISIONAL BIOPSY Right 36 years ago   neg   BUNIONECTOMY Bilateral    COLONOSCOPY N/A 07/02/2008   KNEE ARTHROSCOPY Right 2006   LEFT HEART CATH AND CORONARY ANGIOGRAPHY Left 02/09/2007   Procedure: LEFT HEART CATH AND CORONARY ANGIOGRAPHY; Location: [facility] Bilateral    TOTAL ABDOMINAL HYSTERECTOMY W/ BILATERAL SALPINGOOPHORECTOMY N/A    TOTAL KNEE ARTHROPLASTY Left 04/26/2014   TUBAL LIGATION      Current Medications: Current Meds  Medication Sig   acetaminophen (TYLENOL) 500 MG tablet Take 1,000 mg by mouth 2 (two) times daily.   apixaban (ELIQUIS) 2.5 MG TABS tablet TAKE 1 TABLET BY MOUTH EVERY 12 HOURS.   aspirin EC 81 MG tablet Take 1 tablet (81 mg total) by mouth daily. Swallow whole.   calcium carbonate (OSCAL) 1500 (600 Ca) MG TABS tablet Take 600 mg of elemental calcium by mouth 2 (two) times daily with a meal.   Cholecalciferol (VITAMIN D) 50 MCG (2000 UT) tablet Take 2,000 Units by mouth daily.   EPINEPHrine 0.3 mg/0.3 mL IJ SOAJ injection Inject 0.3 mg into the muscle as needed for anaphylaxis.   fluticasone (FLONASE) 50 MCG/ACT nasal spray Place 2 sprays into the nose as needed.   loratadine (CLARITIN) 10 MG tablet Take 10 mg by mouth daily.   metoprolol succinate (TOPROL-XL) 25 MG 24 hr tablet TAKE 1 TABLET DAILY   Olopatadine HCl (PAZEO) 0.7 % SOLN Place 1 drop into both eyes  daily as needed.   rosuvastatin (CRESTOR) 10 MG tablet TAKE 1/2 TABLET (5MG  TOTAL) BY MOUTH DAILY (Patient taking differently: Take 5 mg by mouth at bedtime.)   triamcinolone cream (KENALOG) 0.1 % Apply 1 Application topically 2 (two) times daily as needed.   vitamin B-12 (CYANOCOBALAMIN) 1000 MCG tablet Take 1 tablet (1,000 mcg total) by mouth daily. (Patient taking differently: Take 500 mcg by mouth daily.)    Allergies:   Lisinopril, Mixed grasses, Bee venom, Azithromycin, Baclofen, Iodine, Morphine and related, and Sulfa antibiotics   Social History   Socioeconomic History   Marital status: Widowed    Spouse name: Not on file   Number of children: Not on file   Years of education: Not on file   Highest education level: Not on file  Occupational History   Not on file  Tobacco Use   Smoking status: Never   Smokeless tobacco: Never  Vaping Use  Vaping Use: Never used  Substance and Sexual Activity   Alcohol use: Not Currently   Drug use: Never   Sexual activity: Not Currently  Other Topics Concern   Not on file  Social History Narrative   Not on file   Social Determinants of Health   Financial Resource Strain: Not on file  Food Insecurity: Not on file  Transportation Needs: Not on file  Physical Activity: Not on file  Stress: Not on file  Social Connections: Not on file     Family History:  The patient's family history includes Stroke in her brother. She was adopted.  ROS:   12-point review of systems is negative unless otherwise noted in the HPI.   EKGs/Labs/Other Studies Reviewed:    Studies reviewed were summarized above. The additional studies were reviewed today:  2D echo 08/11/2021: 1. Left ventricular ejection fraction, by estimation, is 45 to 50%. The  left ventricle has mildly decreased function. The left ventricle has no  regional wall motion abnormalities. Left ventricular diastolic parameters  are indeterminate.   2. Right ventricular systolic  function is normal. The right ventricular  size is normal.   3. Left atrial size was severely dilated.   4. Right atrial size was mildly dilated.   5. The mitral valve is normal in structure. Mild to moderate mitral valve  regurgitation.   6. Tricuspid valve regurgitation is mild to moderate.   7. The aortic valve is tricuspid. Aortic valve regurgitation is not  visualized.   8. The inferior vena cava is normal in size with <50% respiratory  variability, suggesting right atrial pressure of 8 mmHg. __________  2D echo 08/20/2018: - Left ventricle: The cavity size was mildly dilated. Wall    thickness was normal. Systolic function was mildly reduced. The    estimated ejection fraction was in the range of 45% to 50%.    Hypokinesis of the inferior myocardium.  - Left atrium: The atrium was mildly dilated.   Impressions:   - Mildly reduced LVF    Inferior Hypokinesis    EF=45-50%    Normal Right side. No cardiac source of emboli was indentified.  __________  Nuclear stress test 03/13/2014 [facility]): IMPRESSION   Normal myocardial perfusion sestamibi SPECT imaging after  pharmacologic Vasodilation.    Stress ECG not suggestive of  ischemia .  Overall left ventricular function was normal.  There  was no regional wall motion abnormality  __________  Aorta/iliac duplex 09/14/2013 [facility]): Impression:   1. This study shows a normal aorta and iliac arteries. There is no evidence of aneurysmal change or significant atherosclerotic disease.  __________  ABIs 09/14/2013 [facility]): Impression:   1. The right ABI is within normal range.   2. The left ABI is within normal range.   3. Patient was unable to exercise.  __________  Carotid artery ultrasound 09/14/2013 [facility]): Impression:   1. The right ICA demonstrates mild plaque deposition with a stenosis of 0-39%.   2. The left ICA demonstrates mild plaque deposition with a stenosis of 0-39%.   __________  Calcium score 11/27/2011 [facility]): Total calcium score: 129.73.   Note is made of mitral valvular annular calcification, and a small hiatal  hernia.   There is no evidence of pericardial effusion.   Impression:   1. Calcium scoring as detailed above.   2. Pease refer to separate report for the extracardiac findings.  __________  2D echo 08/07/2010 [facility]): SUMMARY:  Normal LV size and systolic function. EF 65%. Mild concentric LVH. Mild 1+ MR. Grade II diastolic dysfunction. E/e' is 10.  __________  [facility] 02/09/2007 [facility]): CORONARY ANATOMY:  1. Left main coronary artery is angiographically normal.  2. Circumflex artery is a large caliber vessel which gives rise  to three significant obtuse marginal branches.  There are no  angiographically apparent lesions in these vessels.  3. Left anterior descending is a long vessel which wraps well  around the apex.  It gives rise to two significant diagonal  branches in proximal and mid LAD.  This vessel is moderately  tortuous.  There are no angiographically apparent lesions in the  LAD or diagonal branches.  4. Right coronary artery is a dominant vessel giving rise to PDA.   This is a moderate caliber vessel.  There are no  angiographically apparent lesions in the right coronary artery.   SUMMARY:  1. Normal LV systolic function with ejection fraction of 60% and  normal wall motion.  Left ventricular end-diastolic pressure is  elevated at 25 mmHg.  2. No angiographically apparent coronary disease in the major  epicardial coronary arteries.  3. Hypertension and elevated left ventricular end-diastolic  pressure may be consistent with diastolic dysfunction which may  be a cause of the patient's dyspnea.   EKG:  EKG is ordered 02/27/2022.  The EKG ordered today demonstrates A-fib, 75 bpm, left anterior fascicular block, LVH, poor R wave progression along the precordial leads  Recent  Labs: 09/04/2021: TSH 3.56 02/27/2022: ALT 10; BUN 34; Creatinine, Ser 1.47; Hemoglobin 11.9; Platelets 360; Potassium 4.2; Sodium 140  Recent Lipid Panel    Component Value Date/Time   CHOL 121 06/04/2021 0000   TRIG 100 06/04/2021 0000   HDL 56 06/04/2021 0000   CHOLHDL 2.2 08/20/2018 0343   VLDL 14 08/20/2018 0343   LDLCALC 46 06/04/2021 0000    PHYSICAL EXAM:    VS:  BP 140/78   Ht 4\' 11"  (1.499 m)   Wt 148 lb 9.6 oz (67.4 kg)   SpO2 96%   BMI 30.01 kg/m   BMI: Body mass index is 30.01 kg/m.  Physical Exam Vitals reviewed.  Constitutional:      Appearance: She is well-developed.  HENT:     Head: Normocephalic and atraumatic.  Eyes:     General:        Right eye: No discharge.        Left eye: No discharge.  Neck:     Vascular: No JVD.  Cardiovascular:     Rate and Rhythm: Normal rate. Rhythm irregularly irregular.     Pulses:          Posterior tibial pulses are 2+ on the right side and 2+ on the left side.     Heart sounds: S1 normal and S2 normal. Heart sounds not distant. No midsystolic click and no opening snap. Murmur heard.     High-pitched blowing holosystolic murmur is present with a grade of 1/6 at the apex.     No friction rub.  Pulmonary:     Effort: Pulmonary effort is normal. No respiratory distress.     Breath sounds: Normal breath sounds. No decreased breath sounds, wheezing or rales.  Chest:     Chest wall: No tenderness.  Abdominal:     General: There is no distension.  Musculoskeletal:     Cervical back: Normal range of motion.     Right lower leg: No edema.  Left lower leg: No edema.  Skin:    General: Skin is warm and dry.     Nails: There is no clubbing.  Neurological:     Mental Status: She is alert and oriented to person, place, and time.  Psychiatric:        Speech: Speech normal.        Behavior: Behavior normal.        Thought Content: Thought content normal.        Judgment: Judgment normal.     Wt Readings from Last 3  Encounters:  03/05/22 148 lb 9.6 oz (67.4 kg)  02/27/22 149 lb (67.6 kg)  10/07/21 146 lb (66.2 kg)     ASSESSMENT & PLAN:   Preoperative cardiac risk stratification: She is scheduled for a right TKA on 03/11/2022.  She is well compensated and without symptoms concerning for angina.  From a cardiac perspective, she is low risk for noncardiac surgery Per RCRI, and she can achieve greater than 4 METs without cardiac limitation.  She may proceed with noncardiac surgery without further testing at an overall low risk from our perspective.  We did discuss bridging apixaban with Lovenox given her history of TIA, which she prefers to avoid at this time.  She should hold apixaban 2-3 days prior to surgery with recommendation to resume as soon as safely possible under the direction of her surgical team.  She will continue aspirin 81 mg daily during the perioperative timeframe given history of TIA.  Permanent A-fib: Asymptomatic with well-controlled ventricular response.  She remains on metoprolol for rate control.  CHA2DS2-VASc 7 (HTN, age x2, TIA x2, vascular disease, sex category).  She remains on renally dosed apixaban 2.5 mg twice daily (age and baseline serum creatinine greater than 1.5), at the direction of her primary cardiologist.  No falls or bleeding concerns.  Systolic dysfunction: She appears euvolemic, well compensated, not requiring a standing diuretic.  She remains on Toprol-XL.  Prior ischemic evaluations have been normal.  HTN: Blood pressure is mildly elevated in the office today, though typically well controlled.  Continue to monitor.  She remains on metoprolol.  HLD: LDL 46 in 05/2021.  She remains on rosuvastatin.  History of recurrent TIA: No new symptoms or residual deficits.  She remains on aspirin and Crestor.  Followed by neurology.    Disposition: F/u with Dr. [name] or an APP in 6 months.   Medication Adjustments/Labs and Tests Ordered: Current medicines are reviewed  at length with the patient today.  Concerns regarding medicines are outlined above. Medication changes, Labs and Tests ordered today are summarized above and listed in the Patient Instructions accessible in Encounters.   Signed, Christell Faith, PA-C 03/05/2022 9:52 AM     [place] [facility] [address] [place] [address] [place], [place] [address] [phone]

## 2022-03-05 NOTE — Patient Instructions (Signed)
Medication Instructions:  No changes at this time.   *If you need a refill on your cardiac medications before your next appointment, please call your pharmacy*   Lab Work: None  If you have labs (blood work) drawn today and your tests are completely normal, you will receive your results only by: MyChart Message (if you have MyChart) OR A paper copy in the mail If you have any lab test that is abnormal or we need to change your treatment, we will call you to review the results.   Testing/Procedures: None   Follow-Up: At [facility], you and your health needs are our priority.  As part of our continuing mission to provide you with exceptional heart care, we have created designated Provider Care Teams.  These Care Teams include your primary Cardiologist (physician) and Advanced Practice Providers (APPs -  Physician Assistants and Nurse Practitioners) who all work together to provide you with the care you need, when you need it.   Your next appointment:   6 month(s)  The format for your next appointment:   In Person  Provider:   Brian Agbor-Etang, MD or Ryan Dunn, PA-C      Important Information About Sugar       

## 2022-03-08 NOTE — H&P (Signed)
ORTHOPAEDIC HISTORY & PHYSICAL Gwenlyn Fudge, [place] - 03/03/2022 2:30 PM EDT Formatting of this note is different from the original. [place] [facility] Chief Complaint:   Chief Complaint  Patient presents with  Knee Pain  H & P RIGHT KNEE   History of Present Illness:   Brittney [name] is a 88 y.o. female that presents to clinic today for her preoperative history and evaluation. Patient presents unaccompanied. The patient is scheduled to undergo a right total knee arthroplasty on 03/11/22 by Dr. Marry Guan. Her pain began several years ago. The pain is located primarily along the medial and posterior aspects of the knee. She describes her pain as worse with weightbearing. She reports associated swelling with some giving way of the knee. She denies associated numbness or tingling, denies locking.   The patient's symptoms have progressed to the point that they decrease her quality of life. The patient has previously undergone conservative treatment including NSAIDS and injections to the knee without adequate control of her symptoms.  Patient has been declared moderate to high risk for surgery from a neurologic standpoint. She has not yet received cardiac clearance, her appointment originally scheduled for today has been rescheduled for Thursday, 03/05/22.  Patient plans to attend rehab at [facility] (part of [facility]).  Past Medical, Surgical, Family, Social History, Allergies, Medications:   Past Medical History:  Past Medical History:  Diagnosis Date  Allergy  Anemia  Arthritis  Atrial fibrillation (CMS-HCC)  Chicken pox  Chronic kidney disease  GERD (gastroesophageal reflux disease)  Gout  Hyperlipidemia  Hypertension   Past Surgical History:  Past Surgical History:  Procedure Laterality Date  HYSTERECTOMY 1996  total  ADENOIDECTOMY  CATARACT EXTRACTION  JOINT REPLACEMENT Left  knee  LAPAROSCOPIC TUBAL LIGATION  TONSILLECTOMY   TONSILLECTOMY  TUBAL LIGATION   Current Medications:  Current Outpatient Medications  Medication Sig Dispense Refill  acetaminophen (TYLENOL) 500 MG tablet Take 1,000 mg by mouth 2 (two) times daily  aspirin 81 MG EC tablet Take 81 mg by mouth once daily  calcium carbonate (CALCIUM 600 ORAL) Take 1 tablet by mouth once daily  cholecalciferol (VITAMIN D3) 1,000 unit capsule Take 1,000 Units by mouth once daily  CLOBETASOL PROPIONATE, BULK, MISC Use 2 (two) times daily  cyanocobalamin (VITAMIN B12) 1000 MCG tablet Take 1 tablet (1,000 mcg total) by mouth once daily 90 tablet 3  ELIQUIS 2.5 mg tablet TAKE 1 TABLET BY MOUTH EVERY 12 HOURS. (Patient taking differently: every 12 (twelve) hours) 180 tablet 1  EPINEPHrine (EPIPEN) 0.3 mg/0.3 mL auto-injector Inject 0.3 mg into the muscle once as needed for Anaphylaxis  fluticasone propionate (FLONASE) 50 mcg/actuation nasal spray Place 2 sprays into both nostrils once daily 16 g 11  loratadine (CLARITIN) 10 mg tablet Take 10 mg by mouth once daily  metoprolol succinate (TOPROL-XL) 25 MG XL tablet TAKE 0.5 TABLET BY MOUTH ONCE DAILY. 90 tablet 1  olopatadine (PAZEO) 0.7 % ophthalmic solution Place 1 drop into both eyes once daily 2.5 mL 11  rosuvastatin (CRESTOR) 10 MG tablet Take 0.5 tablets (5 mg total) by mouth once daily 90 tablet 1  triamcinolone 0.1 % cream Apply 1 Application topically 2 (two) times daily as needed   No current facility-administered medications for this visit.   Allergies:  Allergies  Allergen Reactions  Grass Pollen Anaphylaxis  Lisinopril Swelling  tongue and throat  Insect Venom Swelling  Bee stings- Extreme local reaction at bee sting site. Instructed to  carry epi pen  Venom-Honey Bee Swelling  Azithromycin Diarrhea and Itching  Baclofen Nausea and Vomiting  Clams Diarrhea  Mussels.  Morphine Nausea And Vomiting  Sulfa (Sulfonamide Antibiotics) Rash   Social History:  Social History   Socioeconomic  History  Marital status: Widowed  Spouse name: Josph Macho  Number of children: 4  Years of education: 14+  Occupational History  Occupation: Retired -Journalist, newspaper  Tobacco Use  Smoking status: Never  Smokeless tobacco: Never  Vaping Use  Vaping Use: Never used  Substance and Sexual Activity  Alcohol use: Not Currently  Drug use: Never  Sexual activity: Not Currently  Partners: Male   Family History:  Family History  Adopted: Yes   Review of Systems:   A 10+ ROS was performed, reviewed, and the pertinent orthopaedic findings are documented in the HPI.   Physical Examination:   BP 130/84 (BP Location: Left upper arm, Patient Position: Sitting, BP Cuff Size: Adult)  Ht 149.9 cm (4\' 11" )  Wt 67.8 kg (149 lb 6.4 oz)  BMI 30.18 kg/m   Patient is a well-developed, well-nourished female in no acute distress. Patient has normal mood and affect. Patient is alert and oriented to person, place, and time.   HEENT: Atraumatic, normocephalic. Pupils equal and reactive to light. Extraocular motion intact. Noninjected sclera.  Cardiovascular: Irreegular rate and rhythm, with no murmurs, rubs, or gallops. Distal pulses palpable.  Respiratory: Lungs clear to auscultation bilaterally.   Right Knee: Soft tissue swelling: minimal Effusion: none Erythema: none Crepitance: mild Tenderness: medial Alignment: relative varus Mediolateral laxity: medial pseudolaxity Posterior sag: negative Patellar tracking: Good tracking without evidence of subluxation or tilt Atrophy: No significant atrophy.  Quadriceps tone was fair to good. Range of motion: 0/5/123 degrees   Sensation intact over the saphenous, lateral sural cutaneous, superficial fibular, and deep fibular nerve distributions.  Tests Performed/Reviewed:  X-rays  Previous x-rays of the right knee were reviewed and revealed moderate loss of medial compartment joint space with significant subchondral sclerosis noted.  Chondrocalcinosis is noted laterally. Lateral view shows severe loss of medial compartment joint space with posterior translation of the femoral condyles on the tibia.  Impression:   ICD-10-CM  1. Primary osteoarthritis of right knee M17.11   Plan:   The patient has end-stage degenerative changes of the right knee. It was explained to the patient that the condition is progressive in nature. Having failed conservative treatment, the patient has elected to proceed with a total joint arthroplasty. The patient will undergo a total joint arthroplasty with Dr. Marry Guan. The risks of surgery, including blood clot and infection, were discussed with the patient. Measures to reduce these risks, including the use of anticoagulation, perioperative antibiotics, and early ambulation were discussed. The importance of postoperative physical therapy was discussed with the patient. The patient elects to proceed with surgery. The patient is instructed to stop all blood thinners (excluding 81 mg ASA) prior to surgery. The patient is instructed to call the hospital the day before surgery to learn of the proper arrival time.   Contact our office with any questions or concerns. Follow up as indicated, or sooner should any new problems arise, if conditions worsen, or if they are otherwise concerned.   Gwenlyn Fudge, [facility] [place], [place] [address] Phone: [phone]  This note was generated in part with voice recognition software and I apologize for any typographical errors that were not detected and corrected.  Electronically signed  by Gwenlyn Fudge, PA at 03/03/2022 5:49 PM EDT

## 2022-03-11 ENCOUNTER — Observation Stay
Admission: RE | Admit: 2022-03-11 | Discharge: 2022-03-12 | Disposition: A | Discharge status: Other Acute Inpatient Hospital | Payer: Medicare Other | Source: Ambulatory Visit | Attending: Orthopedic Surgery | Admitting: Orthopedic Surgery

## 2022-03-11 ENCOUNTER — Inpatient Hospital Stay: Payer: Medicare Other | Admitting: Urgent Care

## 2022-03-11 ENCOUNTER — Encounter
Admission: RE | Disposition: A | Discharge status: Other Acute Inpatient Hospital | Payer: Self-pay | Source: Ambulatory Visit | Attending: Orthopedic Surgery

## 2022-03-11 ENCOUNTER — Encounter: Payer: Self-pay | Admitting: Orthopedic Surgery

## 2022-03-11 ENCOUNTER — Other Ambulatory Visit: Payer: Self-pay

## 2022-03-11 ENCOUNTER — Observation Stay: Payer: Medicare Other

## 2022-03-11 ENCOUNTER — Other Ambulatory Visit: Payer: Self-pay | Admitting: Orthopedic Surgery

## 2022-03-11 ENCOUNTER — Ambulatory Visit
Admission: RE | Admit: 2022-03-11 | Discharge: 2022-03-11 | Disposition: A | Discharge status: Home / Self Care | Payer: Self-pay | Source: Ambulatory Visit | Attending: Orthopedic Surgery | Admitting: Orthopedic Surgery

## 2022-03-11 DIAGNOSIS — M25562 Pain in left knee: Secondary | ICD-10-CM

## 2022-03-11 DIAGNOSIS — Z8673 Personal history of transient ischemic attack (TIA), and cerebral infarction without residual deficits: Secondary | ICD-10-CM | POA: Insufficient documentation

## 2022-03-11 DIAGNOSIS — Z7901 Long term (current) use of anticoagulants: Secondary | ICD-10-CM | POA: Insufficient documentation

## 2022-03-11 DIAGNOSIS — M1711 Unilateral primary osteoarthritis, right knee: Principal | ICD-10-CM | POA: Insufficient documentation

## 2022-03-11 DIAGNOSIS — Z79899 Other long term (current) drug therapy: Secondary | ICD-10-CM | POA: Insufficient documentation

## 2022-03-11 DIAGNOSIS — I2510 Atherosclerotic heart disease of native coronary artery without angina pectoris: Secondary | ICD-10-CM | POA: Insufficient documentation

## 2022-03-11 DIAGNOSIS — Z7982 Long term (current) use of aspirin: Secondary | ICD-10-CM | POA: Diagnosis not present

## 2022-03-11 DIAGNOSIS — I129 Hypertensive chronic kidney disease with stage 1 through stage 4 chronic kidney disease, or unspecified chronic kidney disease: Secondary | ICD-10-CM | POA: Diagnosis not present

## 2022-03-11 DIAGNOSIS — I4891 Unspecified atrial fibrillation: Secondary | ICD-10-CM | POA: Insufficient documentation

## 2022-03-11 DIAGNOSIS — N1831 Chronic kidney disease, stage 3a: Secondary | ICD-10-CM

## 2022-03-11 DIAGNOSIS — N184 Chronic kidney disease, stage 4 (severe): Secondary | ICD-10-CM | POA: Insufficient documentation

## 2022-03-11 DIAGNOSIS — E785 Hyperlipidemia, unspecified: Secondary | ICD-10-CM

## 2022-03-11 DIAGNOSIS — M1712 Unilateral primary osteoarthritis, left knee: Principal | ICD-10-CM

## 2022-03-11 DIAGNOSIS — Z96652 Presence of left artificial knee joint: Secondary | ICD-10-CM | POA: Insufficient documentation

## 2022-03-11 DIAGNOSIS — Z96659 Presence of unspecified artificial knee joint: Secondary | ICD-10-CM

## 2022-03-11 HISTORY — DX: Angina pectoris, unspecified: I20.9

## 2022-03-11 HISTORY — DX: Lymphocytic colitis: K52.832

## 2022-03-11 HISTORY — DX: Other specified symptoms and signs involving the circulatory and respiratory systems: R09.89

## 2022-03-11 HISTORY — DX: Spinal stenosis, lumbar region without neurogenic claudication: M48.061

## 2022-03-11 HISTORY — DX: Lactose intolerance, unspecified: E73.9

## 2022-03-11 HISTORY — DX: Chronic kidney disease, stage 4 (severe): N18.4

## 2022-03-11 HISTORY — PX: KNEE ARTHROPLASTY: SHX992

## 2022-03-11 HISTORY — DX: Other specified health status: Z78.9

## 2022-03-11 HISTORY — DX: Age-related osteoporosis without current pathological fracture: M81.0

## 2022-03-11 HISTORY — DX: Disorder of arteries and arterioles, unspecified: I77.9

## 2022-03-11 HISTORY — DX: Encounter for adoption services: Z02.82

## 2022-03-11 LAB — ABO/RH: ABO/RH(D): A POS

## 2022-03-11 SURGERY — ARTHROPLASTY, KNEE, TOTAL, USING IMAGELESS COMPUTER-ASSISTED NAVIGATION
Anesthesia: Spinal | Site: Knee | Laterality: Right

## 2022-03-11 MED ORDER — CHLORHEXIDINE GLUCONATE 0.12 % MT SOLN: 15.0000 mL | Freq: Once | OROMUCOSAL | Status: AC

## 2022-03-11 MED ORDER — PANTOPRAZOLE SODIUM 40 MG PO TBEC
40.0000 mg | DELAYED_RELEASE_TABLET | Freq: Two times a day (BID) | ORAL | Status: DC
  Administered 2022-03-11: 40 mg via ORAL
  Filled 2022-03-11 (×2): qty 1

## 2022-03-11 MED ORDER — ONDANSETRON HCL 4 MG PO TABS: 4.0000 mg | ORAL_TABLET | Freq: Four times a day (QID) | ORAL | Status: DC | PRN

## 2022-03-11 MED ORDER — TRANEXAMIC ACID-NACL 1000-0.7 MG/100ML-% IV SOLN: 1000.0000 mg | Freq: Once | INTRAVENOUS | Status: AC

## 2022-03-11 MED ORDER — DIPHENHYDRAMINE HCL 12.5 MG/5ML PO ELIX: 12.5000 mg | ORAL_SOLUTION | ORAL | Status: DC | PRN

## 2022-03-11 MED ORDER — TRAMADOL HCL 50 MG PO TABS: 50.0000 mg | ORAL_TABLET | ORAL | Status: DC | PRN

## 2022-03-11 MED ORDER — BUPIVACAINE HCL (PF) 0.5 % IJ SOLN
INTRAMUSCULAR | Status: DC | PRN
  Administered 2022-03-11: 2.8 mL

## 2022-03-11 MED ORDER — GABAPENTIN 300 MG PO CAPS
300.0000 mg | ORAL_CAPSULE | Freq: Once | ORAL | Status: AC
  Administered 2022-03-11: 300 mg via ORAL

## 2022-03-11 MED ORDER — CEFAZOLIN SODIUM-DEXTROSE 2-4 GM/100ML-% IV SOLN
INTRAVENOUS | Status: AC
  Filled 2022-03-11: qty 100

## 2022-03-11 MED ORDER — BUPIVACAINE HCL (PF) 0.5 % IJ SOLN
INTRAMUSCULAR | Status: AC
  Filled 2022-03-11: qty 10

## 2022-03-11 MED ORDER — SODIUM CHLORIDE 0.9 % IR SOLN
Status: DC | PRN
  Administered 2022-03-11: 3000 mL

## 2022-03-11 MED ORDER — SURGIPHOR WOUND IRRIGATION SYSTEM - OPTIME
TOPICAL | Status: DC | PRN
  Administered 2022-03-11: 1

## 2022-03-11 MED ORDER — ORAL CARE MOUTH RINSE: 15.0000 mL | Freq: Once | OROMUCOSAL | Status: AC

## 2022-03-11 MED ORDER — OLOPATADINE HCL 0.1 % OP SOLN
1.0000 [drp] | Freq: Every day | OPHTHALMIC | Status: DC | PRN
Start: 2022-03-11 — End: 2022-03-12

## 2022-03-11 MED ORDER — VITAMIN D 25 MCG (1000 UNIT) PO TABS
2000.0000 [IU] | ORAL_TABLET | Freq: Every day | ORAL | Status: DC
  Administered 2022-03-12: 2000 [IU] via ORAL
  Filled 2022-03-11: qty 2

## 2022-03-11 MED ORDER — HYDROMORPHONE HCL 1 MG/ML IJ SOLN: 0.5000 mg | INTRAMUSCULAR | Status: DC | PRN

## 2022-03-11 MED ORDER — SODIUM CHLORIDE 0.9 % IV SOLN: INTRAVENOUS | Status: DC

## 2022-03-11 MED ORDER — FLEET ENEMA 7-19 GM/118ML RE ENEM
1.0000 | ENEMA | Freq: Once | RECTAL | Status: DC | PRN
Start: 2022-03-11 — End: 2022-03-12

## 2022-03-11 MED ORDER — ASPIRIN 81 MG PO TBEC
81.0000 mg | DELAYED_RELEASE_TABLET | Freq: Every day | ORAL | Status: DC
  Administered 2022-03-11 – 2022-03-12 (×2): 81 mg via ORAL
  Filled 2022-03-11 (×2): qty 1

## 2022-03-11 MED ORDER — PROPOFOL 10 MG/ML IV BOLUS
INTRAVENOUS | Status: DC | PRN
  Administered 2022-03-11: 20 mg via INTRAVENOUS

## 2022-03-11 MED ORDER — MENTHOL 3 MG MT LOZG: 1.0000 | LOZENGE | OROMUCOSAL | Status: DC | PRN

## 2022-03-11 MED ORDER — METOCLOPRAMIDE HCL 5 MG PO TABS
10.0000 mg | ORAL_TABLET | Freq: Three times a day (TID) | ORAL | Status: DC
  Administered 2022-03-11: 10 mg via ORAL
  Filled 2022-03-11 (×2): qty 2

## 2022-03-11 MED ORDER — METOPROLOL SUCCINATE ER 25 MG PO TB24
25.0000 mg | ORAL_TABLET | Freq: Every day | ORAL | Status: DC
  Administered 2022-03-12: 25 mg via ORAL
  Filled 2022-03-11: qty 1

## 2022-03-11 MED ORDER — CELECOXIB 200 MG PO CAPS: 400.0000 mg | ORAL_CAPSULE | Freq: Once | ORAL | Status: AC

## 2022-03-11 MED ORDER — 0.9 % SODIUM CHLORIDE (POUR BTL) OPTIME
TOPICAL | Status: DC | PRN
  Administered 2022-03-11: 500 mL

## 2022-03-11 MED ORDER — DEXAMETHASONE SODIUM PHOSPHATE 10 MG/ML IJ SOLN: 8.0000 mg | Freq: Once | INTRAMUSCULAR | Status: AC

## 2022-03-11 MED ORDER — TRANEXAMIC ACID-NACL 1000-0.7 MG/100ML-% IV SOLN
INTRAVENOUS | Status: AC
  Filled 2022-03-11: qty 100

## 2022-03-11 MED ORDER — ACETAMINOPHEN 10 MG/ML IV SOLN
INTRAVENOUS | Status: DC | PRN
  Administered 2022-03-11: 1000 mg via INTRAVENOUS

## 2022-03-11 MED ORDER — PROPOFOL 500 MG/50ML IV EMUL
INTRAVENOUS | Status: DC | PRN
  Administered 2022-03-11: 50 ug/kg/min via INTRAVENOUS

## 2022-03-11 MED ORDER — LACTATED RINGERS IV SOLN: INTRAVENOUS | Status: DC

## 2022-03-11 MED ORDER — CEFAZOLIN SODIUM-DEXTROSE 2-4 GM/100ML-% IV SOLN
2.0000 g | INTRAVENOUS | Status: AC
Start: 2022-03-11 — End: 2022-03-11
  Administered 2022-03-11: 2 g via INTRAVENOUS

## 2022-03-11 MED ORDER — OXYCODONE HCL 5 MG PO TABS: 5.0000 mg | ORAL_TABLET | ORAL | Status: DC | PRN

## 2022-03-11 MED ORDER — CHLORHEXIDINE GLUCONATE 0.12 % MT SOLN
OROMUCOSAL | Status: AC
  Administered 2022-03-11: 15 mL via OROMUCOSAL
  Filled 2022-03-11: qty 15

## 2022-03-11 MED ORDER — SENNOSIDES-DOCUSATE SODIUM 8.6-50 MG PO TABS
1.0000 | ORAL_TABLET | Freq: Two times a day (BID) | ORAL | Status: DC
  Administered 2022-03-11: 1 via ORAL
  Filled 2022-03-11 (×2): qty 1

## 2022-03-11 MED ORDER — VITAMIN B-12 1000 MCG PO TABS
500.0000 ug | ORAL_TABLET | Freq: Every day | ORAL | Status: DC
  Administered 2022-03-12: 500 ug via ORAL
  Filled 2022-03-11: qty 1

## 2022-03-11 MED ORDER — FAMOTIDINE 20 MG PO TABS
ORAL_TABLET | ORAL | Status: AC
  Administered 2022-03-11: 20 mg via ORAL
  Filled 2022-03-11: qty 1

## 2022-03-11 MED ORDER — CALCIUM CARBONATE 1250 (500 CA) MG PO TABS
500.0000 mg | ORAL_TABLET | Freq: Two times a day (BID) | ORAL | Status: DC
  Administered 2022-03-12: 1250 mg via ORAL
  Filled 2022-03-11 (×2): qty 1

## 2022-03-11 MED ORDER — ROSUVASTATIN CALCIUM 5 MG PO TABS
5.0000 mg | ORAL_TABLET | Freq: Every day | ORAL | Status: DC
  Administered 2022-03-11: 5 mg via ORAL
  Filled 2022-03-11: qty 1

## 2022-03-11 MED ORDER — FAMOTIDINE 20 MG PO TABS: 20.0000 mg | ORAL_TABLET | Freq: Once | ORAL | Status: AC

## 2022-03-11 MED ORDER — ONDANSETRON HCL 4 MG/2ML IJ SOLN: 4.0000 mg | Freq: Four times a day (QID) | INTRAMUSCULAR | Status: DC | PRN

## 2022-03-11 MED ORDER — BISACODYL 10 MG RE SUPP: 10.0000 mg | Freq: Every day | RECTAL | Status: DC | PRN

## 2022-03-11 MED ORDER — SODIUM CHLORIDE (PF) 0.9 % IJ SOLN
INTRAMUSCULAR | Status: DC | PRN
  Administered 2022-03-11: 120 mL via INTRAMUSCULAR

## 2022-03-11 MED ORDER — OXYCODONE HCL 5 MG PO TABS: 5.0000 mg | ORAL_TABLET | Freq: Once | ORAL | Status: DC | PRN

## 2022-03-11 MED ORDER — ACETAMINOPHEN 325 MG PO TABS: 325.0000 mg | ORAL_TABLET | Freq: Four times a day (QID) | ORAL | Status: DC | PRN

## 2022-03-11 MED ORDER — FERROUS SULFATE 325 (65 FE) MG PO TABS
325.0000 mg | ORAL_TABLET | Freq: Two times a day (BID) | ORAL | Status: DC
  Administered 2022-03-12: 325 mg via ORAL
  Filled 2022-03-11: qty 1

## 2022-03-11 MED ORDER — OXYCODONE HCL 5 MG PO TABS: 10.0000 mg | ORAL_TABLET | ORAL | Status: DC | PRN

## 2022-03-11 MED ORDER — DEXAMETHASONE SODIUM PHOSPHATE 10 MG/ML IJ SOLN
INTRAMUSCULAR | Status: AC
  Administered 2022-03-11: 8 mg via INTRAVENOUS
  Filled 2022-03-11: qty 1

## 2022-03-11 MED ORDER — ACETAMINOPHEN 10 MG/ML IV SOLN
INTRAVENOUS | Status: AC
  Filled 2022-03-11: qty 100

## 2022-03-11 MED ORDER — ENSURE PRE-SURGERY PO LIQD
296.0000 mL | Freq: Once | ORAL | Status: AC
  Administered 2022-03-11: 296 mL via ORAL
  Filled 2022-03-11: qty 296

## 2022-03-11 MED ORDER — LORATADINE 10 MG PO TABS
10.0000 mg | ORAL_TABLET | Freq: Every day | ORAL | Status: DC
  Administered 2022-03-12: 10 mg via ORAL
  Filled 2022-03-11: qty 1

## 2022-03-11 MED ORDER — PHENOL 1.4 % MT LIQD: 1.0000 | OROMUCOSAL | Status: DC | PRN

## 2022-03-11 MED ORDER — ACETAMINOPHEN 10 MG/ML IV SOLN
1000.0000 mg | Freq: Four times a day (QID) | INTRAVENOUS | Status: DC
  Administered 2022-03-11 – 2022-03-12 (×3): 1000 mg via INTRAVENOUS
  Filled 2022-03-11 (×3): qty 100

## 2022-03-11 MED ORDER — PROPOFOL 1000 MG/100ML IV EMUL
INTRAVENOUS | Status: AC
  Filled 2022-03-11: qty 100

## 2022-03-11 MED ORDER — GABAPENTIN 300 MG PO CAPS
ORAL_CAPSULE | ORAL | Status: AC
  Filled 2022-03-11: qty 1

## 2022-03-11 MED ORDER — ALUM & MAG HYDROXIDE-SIMETH 200-200-20 MG/5ML PO SUSP: 30.0000 mL | ORAL | Status: DC | PRN

## 2022-03-11 MED ORDER — TRANEXAMIC ACID-NACL 1000-0.7 MG/100ML-% IV SOLN
1000.0000 mg | INTRAVENOUS | Status: AC
  Administered 2022-03-11: 1000 mg via INTRAVENOUS

## 2022-03-11 MED ORDER — CEFAZOLIN SODIUM-DEXTROSE 2-4 GM/100ML-% IV SOLN
2.0000 g | Freq: Four times a day (QID) | INTRAVENOUS | Status: AC
  Administered 2022-03-11 – 2022-03-12 (×2): 2 g via INTRAVENOUS
  Filled 2022-03-11 (×2): qty 100

## 2022-03-11 MED ORDER — CELECOXIB 200 MG PO CAPS
ORAL_CAPSULE | ORAL | Status: AC
  Administered 2022-03-11: 400 mg via ORAL
  Filled 2022-03-11: qty 2

## 2022-03-11 MED ORDER — PHENYLEPHRINE HCL-NACL 20-0.9 MG/250ML-% IV SOLN
INTRAVENOUS | Status: DC | PRN
  Administered 2022-03-11: 10 ug/min via INTRAVENOUS

## 2022-03-11 MED ORDER — FENTANYL CITRATE (PF) 100 MCG/2ML IJ SOLN: 25.0000 ug | INTRAMUSCULAR | Status: DC | PRN

## 2022-03-11 MED ORDER — FLUTICASONE PROPIONATE 50 MCG/ACT NA SUSP
2.0000 | NASAL | Status: DC | PRN
Start: 2022-03-11 — End: 2022-03-12

## 2022-03-11 MED ORDER — OXYCODONE HCL 5 MG/5ML PO SOLN: 5.0000 mg | Freq: Once | ORAL | Status: DC | PRN

## 2022-03-11 MED ORDER — APIXABAN 2.5 MG PO TABS
2.5000 mg | ORAL_TABLET | Freq: Two times a day (BID) | ORAL | Status: DC
  Administered 2022-03-12: 2.5 mg via ORAL
  Filled 2022-03-11: qty 1

## 2022-03-11 MED ORDER — MAGNESIUM HYDROXIDE 400 MG/5ML PO SUSP
30.0000 mL | Freq: Every day | ORAL | Status: DC
  Administered 2022-03-11: 30 mL via ORAL
  Filled 2022-03-11 (×2): qty 30

## 2022-03-11 MED ORDER — CHLORHEXIDINE GLUCONATE 4 % EX LIQD
60.0000 mL | Freq: Once | CUTANEOUS | Status: AC
  Administered 2022-03-11: 4 via TOPICAL

## 2022-03-11 MED ORDER — TRANEXAMIC ACID-NACL 1000-0.7 MG/100ML-% IV SOLN
INTRAVENOUS | Status: AC
  Administered 2022-03-11: 1000 mg via INTRAVENOUS
  Filled 2022-03-11: qty 100

## 2022-03-11 SURGICAL SUPPLY — 74 items
ATTUNE MED DOME PAT 38 KNEE (Knees) ×1 IMPLANT
ATTUNE PS FEM RT SZ 4 CEM KNEE (Femur) ×1 IMPLANT
ATTUNE PSRP INSR SZ4 8 KNEE (Insert) ×1 IMPLANT
BASEPLATE TIBIAL ROTATING SZ 4 (Knees) ×1 IMPLANT
BATTERY INSTRU NAVIGATION (MISCELLANEOUS) ×8 IMPLANT
BLADE SAW 70X12.5 (BLADE) ×2 IMPLANT
BLADE SAW 90X13X1.19 OSCILLAT (BLADE) ×2 IMPLANT
BLADE SAW 90X25X1.19 OSCILLAT (BLADE) ×2 IMPLANT
BONE CEMENT GENTAMICIN (Cement) ×4 IMPLANT
CATH COUDE FOLEY 5CC 14FR (CATHETERS) ×1 IMPLANT
CATH FOL 2WAY LX 16X30 (CATHETERS) IMPLANT
CEMENT BONE GENTAMICIN 40 (Cement) IMPLANT
COOLER POLAR GLACIER W/PUMP (MISCELLANEOUS) ×2 IMPLANT
CUFF TOURN SGL QUICK 24 (TOURNIQUET CUFF)
CUFF TOURN SGL QUICK 34 (TOURNIQUET CUFF)
CUFF TRNQT CYL 24X4X16.5-23 (TOURNIQUET CUFF) IMPLANT
CUFF TRNQT CYL 34X4.125X (TOURNIQUET CUFF) IMPLANT
DRAPE 3/4 80X56 (DRAPES) ×2 IMPLANT
DRAPE INCISE IOBAN 66X45 STRL (DRAPES) IMPLANT
DRSG DERMACEA NONADH 3X8 (GAUZE/BANDAGES/DRESSINGS) ×2 IMPLANT
DRSG MEPILEX SACRM 8.7X9.8 (GAUZE/BANDAGES/DRESSINGS) ×2 IMPLANT
DRSG OPSITE POSTOP 4X14 (GAUZE/BANDAGES/DRESSINGS) ×2 IMPLANT
DRSG TEGADERM 4X4.75 (GAUZE/BANDAGES/DRESSINGS) ×2 IMPLANT
DURAPREP 26ML APPLICATOR (WOUND CARE) ×4 IMPLANT
ELECT CAUTERY BLADE 6.4 (BLADE) ×2 IMPLANT
ELECT REM PT RETURN 9FT ADLT (ELECTROSURGICAL) ×2
ELECTRODE REM PT RTRN 9FT ADLT (ELECTROSURGICAL) ×1 IMPLANT
EX-PIN ORTHOLOCK NAV 4X150 (PIN) ×4 IMPLANT
GLOVE BIO SURGEON STRL SZ7 (GLOVE) ×4 IMPLANT
GLOVE BIOGEL M STRL SZ7.5 (GLOVE) ×4 IMPLANT
GLOVE BIOGEL PI IND STRL 8 (GLOVE) ×1 IMPLANT
GLOVE BIOGEL PI INDICATOR 8 (GLOVE) ×1
GLOVE SURG UNDER POLY LF SZ7.5 (GLOVE) ×2 IMPLANT
GOWN STRL REUS W/ TWL LRG LVL3 (GOWN DISPOSABLE) ×2 IMPLANT
GOWN STRL REUS W/ TWL XL LVL3 (GOWN DISPOSABLE) ×1 IMPLANT
GOWN STRL REUS W/TWL LRG LVL3 (GOWN DISPOSABLE) ×2
GOWN STRL REUS W/TWL XL LVL3 (GOWN DISPOSABLE) ×1
HEMOVAC 400CC 10FR (MISCELLANEOUS) ×2 IMPLANT
HOLDER FOLEY CATH W/STRAP (MISCELLANEOUS) ×2 IMPLANT
HOLSTER ELECTROSUGICAL PENCIL (MISCELLANEOUS) ×2 IMPLANT
HOOD PEEL AWAY FLYTE STAYCOOL (MISCELLANEOUS) ×4 IMPLANT
IV NS IRRIG 3000ML ARTHROMATIC (IV SOLUTION) ×2 IMPLANT
KIT TURNOVER KIT A (KITS) ×2 IMPLANT
KNIFE SCULPS 14X20 (INSTRUMENTS) ×2 IMPLANT
MANIFOLD NEPTUNE II (INSTRUMENTS) ×4 IMPLANT
NDL SPNL 20GX3.5 QUINCKE YW (NEEDLE) ×2 IMPLANT
NEEDLE SPNL 20GX3.5 QUINCKE YW (NEEDLE) ×4 IMPLANT
NS IRRIG 500ML POUR BTL (IV SOLUTION) ×2 IMPLANT
PACK TOTAL KNEE (MISCELLANEOUS) ×2 IMPLANT
PAD ABD DERMACEA PRESS 5X9 (GAUZE/BANDAGES/DRESSINGS) ×4 IMPLANT
PAD WRAPON POLAR KNEE (MISCELLANEOUS) ×1 IMPLANT
PIN DRILL FIX HALF THREAD (BIT) ×4 IMPLANT
PIN FIXATION 1/8DIA X 3INL (PIN) ×2 IMPLANT
PULSAVAC PLUS IRRIG FAN TIP (DISPOSABLE) ×2
SOL PREP PVP 2OZ (MISCELLANEOUS) ×2
SOLUTION IRRIG SURGIPHOR (IV SOLUTION) ×2 IMPLANT
SOLUTION PREP PVP 2OZ (MISCELLANEOUS) ×1 IMPLANT
SPONGE DRAIN TRACH 4X4 STRL 2S (GAUZE/BANDAGES/DRESSINGS) ×2 IMPLANT
STAPLER SKIN PROX 35W (STAPLE) ×2 IMPLANT
STOCKINETTE IMPERV 14X48 (MISCELLANEOUS) ×1 IMPLANT
STRAP TIBIA SHORT (MISCELLANEOUS) ×2 IMPLANT
SUCTION FRAZIER HANDLE 10FR (MISCELLANEOUS) ×1
SUCTION TUBE FRAZIER 10FR DISP (MISCELLANEOUS) ×1 IMPLANT
SUT VIC AB 0 CT1 36 (SUTURE) ×4 IMPLANT
SUT VIC AB 1 CT1 36 (SUTURE) ×4 IMPLANT
SUT VIC AB 2-0 CT2 27 (SUTURE) ×2 IMPLANT
SYR 30ML LL (SYRINGE) ×4 IMPLANT
TIP FAN IRRIG PULSAVAC PLUS (DISPOSABLE) ×1 IMPLANT
TOWEL OR 17X26 4PK STRL BLUE (TOWEL DISPOSABLE) IMPLANT
TOWER CARTRIDGE SMART MIX (DISPOSABLE) ×2 IMPLANT
TRAY FOLEY MTR SLVR 16FR STAT (SET/KITS/TRAYS/PACK) ×2 IMPLANT
WATER STERILE IRR 1000ML POUR (IV SOLUTION) ×1 IMPLANT
WATER STERILE IRR 500ML POUR (IV SOLUTION) ×1 IMPLANT
WRAPON POLAR PAD KNEE (MISCELLANEOUS) ×2

## 2022-03-11 NOTE — Anesthesia Preprocedure Evaluation (Signed)
Anesthesia Evaluation  Patient identified by MRN, date of birth, ID band Patient awake    Reviewed: Allergy & Precautions, NPO status , Patient's Chart, lab work & pertinent test results  History of Anesthesia Complications Negative for: history of anesthetic complications  Airway Mallampati: III  TM Distance: <3 FB Neck ROM: full    Dental  (+) Chipped, Poor Dentition, Missing   Pulmonary neg shortness of breath,    Pulmonary exam normal        Cardiovascular Exercise Tolerance: Good hypertension, (-) angina+ CAD  + dysrhythmias Atrial Fibrillation  Rhythm:irregular     Neuro/Psych TIA Neuromuscular disease negative psych ROS   GI/Hepatic Neg liver ROS, GERD  Controlled,  Endo/Other  negative endocrine ROS  Renal/GU Renal disease     Musculoskeletal   Abdominal   Peds  Hematology   Anesthesia Other Findings Past Medical History: No date: Adopted person No date: Allergy No date: Anemia No date: Angina pectoris (HCC) No date: Aortic atherosclerosis (HCC) No date: Arthritis No date: Atrial fibrillation (HCC)     Comment:  a. ) CHA2DS2-VASc = 7 (age x 2, sex, HTN, TIA x2, aortic              plaque);  b.) rate/rhythm maintained on oral metoprolol               succinate; chronically anticoagulated using dose reduced               apixaban. No date: Bilateral carotid artery disease (HCC) No date: CAD (coronary artery disease)     Comment:  a.) LHC 02/09/2007: EF 60%, LVEDP 25 mmHg, normal cors;               b.) cCTA 11/27/2011: Ca score 129.73 --> 1.17 RCA, 16.36               LM, 112.21 LAD, 0 LCx; c.) MPI 03/13/2014 - normal. No date: Cataract No date: CKD (chronic kidney disease), stage IV ([place]) 25/95/6387: Diastolic dysfunction     Comment:  a.) LHC 02/09/2008: EF 60%, LVEDP 25 mmHg; b.) TTE               08/07/2010: EF 65%, mild LVH, mild MR, G2DD; b.) TTE               08/20/2018: EF 45-50%, inferior  HK, mild LAE; c.) TTE               08/11/2021: EF 45-50%. sev LAE, mild RAE, mild-mod MR/TR No date: GERD (gastroesophageal reflux disease) No date: Gout No date: Hiatal hernia No date: Hyperlipidemia No date: Hypertension No date: Lactose intolerance 09/08/2021: LAFB (left anterior fascicular block) No date: Left carotid bruit No date: Long term current use of anticoagulant     Comment:  a.) apixaban No date: Lumbar stenosis No date: Lymphocytic colitis No date: Osteoporosis 07/2018: TIA (transient ischemic attack) 08/2021: TIA (transient ischemic attack) No date: Vitamin B12 deficiency No date: Wears hearing BILATERAL aids No date: Wears partial (bottom) dentures  Past Surgical History: 36 years ago: BREAST EXCISIONAL BIOPSY; Right     Comment:  neg No date: BUNIONECTOMY; Bilateral 07/02/2008: COLONOSCOPY; N/A 2006: KNEE ARTHROSCOPY; Right 02/09/2007: LEFT HEART CATH AND CORONARY ANGIOGRAPHY; Left     Comment:  Procedure: LEFT HEART CATH AND CORONARY ANGIOGRAPHY;               Location: [facility] No date: [place]; Bilateral No date: TOTAL ABDOMINAL HYSTERECTOMY W/  BILATERAL  SALPINGOOPHORECTOMY; N/A 04/26/2014: TOTAL KNEE ARTHROPLASTY; Left No date: TUBAL LIGATION  BMI    Body Mass Index: 30.01 kg/m      Reproductive/Obstetrics negative OB ROS                             Anesthesia Physical Anesthesia Plan  ASA: 3  Anesthesia Plan: Spinal   Post-op Pain Management:    Induction:   PONV Risk Score and Plan:   Airway Management Planned: Natural Airway and Nasal Cannula  Additional Equipment:   Intra-op Plan:   Post-operative Plan:   Informed Consent: I have reviewed the patients History and Physical, chart, labs and discussed the procedure including the risks, benefits and alternatives for the proposed anesthesia with the patient or authorized representative who has indicated his/her understanding  and acceptance.     Dental Advisory Given  Plan Discussed with: Anesthesiologist, CRNA and Surgeon  Anesthesia Plan Comments: (Patient reports no bleeding problems and no recent anticoagulant use, lase eliquis was more than 72 hours ago.  Plan for spinal with backup GA  Patient consented for risks of anesthesia including but not limited to:  - adverse reactions to medications - damage to eyes, teeth, lips or other oral mucosa - nerve damage due to positioning  - risk of bleeding, infection and or nerve damage from spinal that could lead to paralysis - risk of headache or failed spinal - damage to teeth, lips or other oral mucosa - sore throat or hoarseness - damage to heart, brain, nerves, lungs, other parts of body or loss of life  Patient voiced understanding.)        Anesthesia Quick Evaluation

## 2022-03-11 NOTE — TOC Initial Note (Signed)
Transition of Care [facility]) - Initial/Assessment Note    Patient Details  Name: Brittney [name] MRN: [id number] Date of Birth: 1930-04-20  Transition of Care [facility]) CM/SW Contact:    Conception Oms, RN Phone Number: 03/11/2022, 1:31 PM  Clinical Narrative:                 Patient was accepted by [facility] for [facility] services        Patient Goals and CMS Choice        Expected Discharge Plan and Services                                                Prior Living Arrangements/Services                       Activities of Daily Living      Permission Sought/Granted                  Emotional Assessment              Admission diagnosis:  PRIMARY OSTEOARTHRITIS OF RIGHT KNEE. Patient Active Problem List   Diagnosis Date Noted   Primary osteoarthritis of right knee 01/19/2022   Mass of joint of right elbow 04/14/2021   Closed fracture of distal end of right radius 03/03/2021   Atrial fibrillation ([place]) 07/10/2020   CKD (chronic kidney disease) stage 3, GFR 30-59 ml/min ([place]) 07/14/2019   Essential hypertension 07/14/2019   Coronary artery disease 07/14/2019   Gout 07/14/2019   Acute pancreatitis 07/14/2019   Cerebrovascular disease 10/31/2018   TIA (transient ischemic attack) 08/19/2018   Chronic allergic rhinitis 04/28/2018   Gastroesophageal reflux disease without esophagitis 04/28/2018   Venous insufficiency 04/28/2018   Hoarseness 08/18/2017   Rash and nonspecific skin eruption 08/18/2017   Fracture of left wrist with routine healing 06/21/2017   Bilateral carotid artery disease ([place]) 02/28/2016   Acute non-recurrent frontal sinusitis 09/23/2015   Cough 09/23/2015   Gastritis 09/30/2014   Osteoporosis of multiple sites 05/15/2014   Total knee replacement status 04/27/2014   Primary localized osteoarthrosis, lower leg 04/26/2014   Scaphoid fracture, wrist, closed 04/10/2014   Angina pectoris ([place]) 03/07/2014   Lower  extremity pain 09/11/2013   Left carotid bruit 09/11/2013   Arthritis of wrist, right, degenerative 12/12/2012   Hyperlipidemia 01/06/2012   CAD (coronary artery disease) 12/28/2011   Statin intolerance 11/23/2011   Chronic kidney disease, stage 4 (severe) ([place]) 09/09/2011   Vitamin B12 deficiency 07/29/2010   Lymphocytic colitis 07/13/2008   Spinal stenosis of lumbar region 04/05/2008   PCP:  Lauree Chandler, NP Pharmacy:   [place], [place] [address] [place] [address] Phone: [phone] Fax: [phone]     Social Determinants of Health (SDOH) Interventions    Readmission Risk Interventions     No data to display

## 2022-03-11 NOTE — Anesthesia Procedure Notes (Signed)
Spinal  Patient location during procedure: OR Start time: 03/11/2022 1:07 PM End time: 03/11/2022 1:10 PM Reason for block: surgical anesthesia Staffing Anesthesiologist: Piscitello, Precious Haws, MD Resident/CRNA: Nelda Marseille, CRNA Other anesthesia staff: Daryel Gerald, RN Performed by: Daryel Gerald, RN Authorized by: Andria Frames, MD   Preanesthetic Checklist Completed: patient identified, IV checked, site marked, risks and benefits discussed, surgical consent, monitors and equipment checked, pre-op evaluation and timeout performed Spinal Block Patient position: sitting Prep: Betadine Patient monitoring: heart rate, continuous pulse ox, blood pressure and cardiac monitor Approach: midline Location: L4-5 Injection technique: single-shot Needle Needle type: Whitacre and Introducer  Needle gauge: 24 G Needle length: 9 cm Assessment Events: CSF return Additional Notes Negative paresthesia. Negative blood return. Positive free-flowing CSF. Expiration date of kit checked and confirmed. Patient tolerated procedure well, without complications.

## 2022-03-11 NOTE — Op Note (Signed)
OPERATIVE NOTE  DATE OF SURGERY:  03/11/2022  PATIENT NAME:  Brittney [name]   DOB: 04-30-1930  MRN: [id number]  PRE-OPERATIVE DIAGNOSIS: Degenerative arthrosis of the right knee, primary  POST-OPERATIVE DIAGNOSIS:  Same  PROCEDURE:  Right total knee arthroplasty using computer-assisted navigation  SURGEON:  Marciano Sequin. M.D.  ASSISTANT: Cassell Smiles, PA-C (present and scrubbed throughout the case, critical for assistance with exposure, retraction, instrumentation, and closure)  ANESTHESIA: spinal  ESTIMATED BLOOD LOSS: 50 mL  FLUIDS REPLACED: 1000 mL of crystalloid  TOURNIQUET TIME: 74 minutes  DRAINS: 2 medium Hemovac drains  SOFT TISSUE RELEASES: Anterior cruciate ligament, posterior cruciate ligament, deep medial collateral ligament, patellofemoral ligament  IMPLANTS UTILIZED: DePuy Attune size 4 posterior stabilized femoral component (cemented), size 4 rotating platform tibial component (cemented), 38 mm medialized dome patella (cemented), and an 8 mm stabilized rotating platform polyethylene insert.  INDICATIONS FOR SURGERY: [place]ac[place] [name] is a 88 y.o. year old female with a long history of progressive knee pain. X-rays demonstrated severe degenerative changes in tricompartmental fashion. The patient had not seen any significant improvement despite conservative nonsurgical intervention. After discussion of the risks and benefits of surgical intervention, the patient expressed understanding of the risks benefits and agree with plans for total knee arthroplasty.   The risks, benefits, and alternatives were discussed at length including but not limited to the risks of infection, bleeding, nerve injury, stiffness, blood clots, the need for revision surgery, cardiopulmonary complications, among others, and they were willing to proceed.  PROCEDURE IN DETAIL: The patient was brought into the operating room and, after adequate spinal anesthesia was achieved, a tourniquet was  placed on the patient's upper thigh. The patient's knee and leg were cleaned and prepped with alcohol and DuraPrep and draped in the usual sterile fashion. A "timeout" was performed as per usual protocol. The lower extremity was exsanguinated using an Esmarch, and the tourniquet was inflated to 300 mmHg. An anterior longitudinal incision was made followed by a standard mid vastus approach. The deep fibers of the medial collateral ligament were elevated in a subperiosteal fashion off of the medial flare of the tibia so as to maintain a continuous soft tissue sleeve. The patella was subluxed laterally and the patellofemoral ligament was incised. Inspection of the knee demonstrated severe degenerative changes with full-thickness loss of articular cartilage. Osteophytes were debrided using a rongeur. Anterior and posterior cruciate ligaments were excised. Two 4.0 mm Schanz pins were inserted in the femur and into the tibia for attachment of the array of trackers used for computer-assisted navigation. Hip center was identified using a circumduction technique. Distal landmarks were mapped using the computer. The distal femur and proximal tibia were mapped using the computer. The distal femoral cutting guide was positioned using computer-assisted navigation so as to achieve a 5 distal valgus cut. The femur was sized and it was felt that a size 4 femoral component was appropriate. A size 4 femoral cutting guide was positioned and the anterior cut was performed and verified using the computer. This was followed by completion of the posterior and chamfer cuts. Femoral cutting guide for the central box was then positioned in the center box cut was performed.  Attention was then directed to the proximal tibia. Medial and lateral menisci were excised. The extramedullary tibial cutting guide was positioned using computer-assisted navigation so as to achieve a 0 varus-valgus alignment and 3 posterior slope. The cut was  performed and verified using the computer. The proximal tibia was sized and  it was felt that a size 4 tibial tray was appropriate. Tibial and femoral trials were inserted followed by insertion of an 8 mm polyethylene insert. This allowed for excellent mediolateral soft tissue balancing both in flexion and in full extension. Finally, the patella was cut and prepared so as to accommodate a 38 mm medialized dome patella. A patella trial was placed and the knee was placed through a range of motion with excellent patellar tracking appreciated. The femoral trial was removed after debridement of posterior osteophytes. The central post-hole for the tibial component was reamed followed by insertion of a keel punch. Tibial trials were then removed. Cut surfaces of bone were irrigated with copious amounts of normal saline using pulsatile lavage and then suctioned dry. Polymethylmethacrylate cement with gentamicin was prepared in the usual fashion using a vacuum mixer. Cement was applied to the cut surface of the proximal tibia as well as along the undersurface of a size 4 rotating platform tibial component. Tibial component was positioned and impacted into place. Excess cement was removed using [employer] fast streamer. Cement was then applied to the cut surfaces of the femur as well as along the posterior flanges of the size 4 femoral component. The femoral component was positioned and impacted into place. Excess cement was removed using [employer] fast streamer. An 8 mm polyethylene trial was inserted and the knee was brought into full extension with steady axial compression applied. Finally, cement was applied to the backside of a 38 mm medialized dome patella and the patellar component was positioned and patellar clamp applied. Excess cement was removed using [employer] fast streamer. After adequate curing of the cement, the tourniquet was deflated after a total tourniquet time of 74 minutes. Hemostasis was achieved using electrocautery. The knee  was irrigated with copious amounts of normal saline using pulsatile lavage followed by 450 ml of Surgiphor and then suctioned dry. 20 mL of 1.3% Exparel and 60 mL of 0.25% Marcaine in 40 mL of normal saline was injected along the posterior capsule, medial and lateral gutters, and along the arthrotomy site. An 8 mm stabilized rotating platform polyethylene insert was inserted and the knee was placed through a range of motion with excellent mediolateral soft tissue balancing appreciated and excellent patellar tracking noted. 2 medium drains were placed in the wound bed and brought out through separate stab incisions. The medial parapatellar portion of the incision was reapproximated using interrupted sutures of #1 Vicryl. Subcutaneous tissue was approximated in layers using first #0 Vicryl followed #2-0 Vicryl. The skin was approximated with skin staples. A sterile dressing was applied.  The patient tolerated the procedure well and was transported to the recovery room in stable condition.    Kofi Murrell P. Holley Bouche., M.D.

## 2022-03-11 NOTE — Anesthesia Procedure Notes (Signed)
Date/Time: 03/11/2022 1:30 PM  Performed by: Nelda Marseille, CRNAPre-anesthesia Checklist: Patient identified, Emergency Drugs available, Suction available, Patient being monitored and Timeout performed Oxygen Delivery Method: Simple face mask

## 2022-03-11 NOTE — H&P (Signed)
The patient has been re-examined, and the chart reviewed, and there have been no interval changes to the documented history and physical.    The risks, benefits, and alternatives have been discussed at length. The patient expressed understanding of the risks benefits and agreed with plans for surgical intervention.  Brianni Manthe P. Azriel Jakob, [name] M.D.    

## 2022-03-11 NOTE — Progress Notes (Addendum)
PT Cancellation Note  Patient Details Name: Brittney [name] MRN: [id number] DOB: 1930-04-25   Cancelled Treatment:    Reason Eval/Treat Not Completed: Medical issues which prohibited therapy Per call with RN in PACU she is starting to get some movement at this time but still numb and not ready to work with PT.  Plan to initiate PT eval tomorrow.  Kreg Shropshire, DPT 03/11/2022, 5:25 PM

## 2022-03-11 NOTE — Transfer of Care (Signed)
Immediate Anesthesia Transfer of Care Note  Patient: Brittney [name]  Procedure(s) Performed: COMPUTER ASSISTED TOTAL KNEE ARTHROPLASTY (Right: Knee)  Patient Location: PACU  Anesthesia Type:Spinal  Level of Consciousness: awake, alert  and oriented  Airway & Oxygen Therapy: Patient Spontanous Breathing  Post-op Assessment: Report given to RN and Post -op Vital signs reviewed and stable  Post vital signs: Reviewed and stable  Last Vitals:  Vitals Value Taken Time  BP    Temp    Pulse 86 03/11/22 1619  Resp 18 03/11/22 1619  SpO2 92 % 03/11/22 1619  Vitals shown include unvalidated device data.  Last Pain:  Vitals:   03/11/22 1052  TempSrc: Oral         Complications: No notable events documented.

## 2022-03-11 NOTE — Progress Notes (Signed)
Pt admitted to floor from surgery. Breathing unlabored and skin warm and dry to touch. Pt shows no s/s of distress. Pt now resting in bed with call bell in reach and will continue to monitor. Pt has bone foam in place'

## 2022-03-12 ENCOUNTER — Encounter: Payer: Self-pay | Admitting: Orthopedic Surgery

## 2022-03-12 DIAGNOSIS — M1711 Unilateral primary osteoarthritis, right knee: Secondary | ICD-10-CM | POA: Diagnosis not present

## 2022-03-12 MED ORDER — OXYCODONE HCL 5 MG PO TABS: 5.0000 mg | ORAL_TABLET | ORAL | 0 refills | Status: DC | PRN

## 2022-03-12 MED ORDER — TRAMADOL HCL 50 MG PO TABS
50.0000 mg | ORAL_TABLET | ORAL | 0 refills | Status: DC | PRN
Start: 2022-03-12 — End: 2022-04-07

## 2022-03-12 NOTE — Plan of Care (Signed)

## 2022-03-12 NOTE — Progress Notes (Signed)
Physical Therapy Treatment Patient Details Name: Brittney [name] MRN: [id number] DOB: 1930-04-03 Today's Date: 03/12/2022   History of Present Illness Pt is a 88 yo s/p R TKA. PMH of HTN, afib, CAD, CKD, L TKA.    PT Comments    Patient alert, agreeble to PT, reported 1/10 R knee pain. The patient was able to transfer from recliner and BSC over standard commode, supervision and cues for RW safety/positioning. She ambulated ~122ft with RW and supervision, and was also able to perform stair navigation with supervision, cued for technique. Returned to room with all needs in reach. ROM 0-100degrees this PM. The patient would benefit from further skilled PT intervention to continue to progress towards goals. Recommendation remains appropriate.      Recommendations for follow up therapy are one component of a multi-disciplinary discharge planning process, led by the attending physician.  Recommendations may be updated based on patient status, additional functional criteria and insurance authorization.  Follow Up Recommendations  Follow physician's recommendations for discharge plan and follow up therapies     Assistance Recommended at Discharge Intermittent Supervision/Assistance  Patient can return home with the following Assistance with cooking/housework;Assist for transportation;Help with stairs or ramp for entrance   Equipment Recommendations  None recommended by PT    Recommendations for Other Services       Precautions / Restrictions Precautions Precautions: Fall;Knee Precaution Booklet Issued: Yes (comment) Restrictions Weight Bearing Restrictions: Yes RLE Weight Bearing: Weight bearing as tolerated     Mobility  Bed Mobility Overal bed mobility: Modified Independent                  Transfers Overall transfer level: Modified independent Equipment used: Rolling walker (2 wheels) Transfers: Sit to/from Stand, Bed to chair/wheelchair/BSC Sit to Stand:  Supervision, Min guard   Step pivot transfers: Min guard            Ambulation/Gait Ambulation/Gait assistance: Supervision Gait Distance (Feet): 150 Feet Assistive device: Rolling walker (2 wheels)         General Gait Details: cues for RW safety/positioning   Stairs Stairs: Yes Stairs assistance: Supervision Stair Management: Two rails, Step to pattern Number of Stairs: 4 General stair comments: cued for technique   Wheelchair Mobility    Modified Rankin (Stroke Patients Only)       Balance Overall balance assessment: Needs assistance Sitting-balance support: Feet supported Sitting balance-Leahy Scale: Good       Standing balance-Leahy Scale: Fair                              Cognition Arousal/Alertness: Awake/alert Behavior During Therapy: WFL for tasks assessed/performed Overall Cognitive Status: Within Functional Limits for tasks assessed                                          Exercises      General Comments        Pertinent Vitals/Pain Pain Assessment Pain Assessment: 0-10 Pain Score: 1  Pain Location: with R knee bending Pain Descriptors / Indicators: Aching, Sore Pain Intervention(s): Limited activity within patient's tolerance, Repositioned    Home Living Family/patient expects to be discharged to:: Private residence Living Arrangements: Alone Available Help at Discharge: Neighbor;Other (Comment) Type of Home: Independent living facility Home Access: Stairs to enter Entrance Stairs-Rails: Can reach both Entrance Stairs-Number of Steps:  2   Home Layout: One level Home Equipment: Grab bars - toilet;Grab bars - tub/shower;BSC/3in1;Rolling Walker (2 wheels);Shower seat;Toilet riser      Prior Function            PT Goals (current goals can now be found in the care plan section) Progress towards PT goals: Progressing toward goals    Frequency    BID      PT Plan Current plan remains  appropriate    Co-evaluation              AM-PAC PT "6 Clicks" Mobility   Outcome Measure  Help needed turning from your back to your side while in a flat bed without using bedrails?: None Help needed moving from lying on your back to sitting on the side of a flat bed without using bedrails?: None Help needed moving to and from a bed to a chair (including a wheelchair)?: None Help needed standing up from a chair using your arms (e.g., wheelchair or bedside chair)?: None Help needed to walk in hospital room?: None Help needed climbing 3-5 steps with a railing? : None 6 Click Score: 24    End of Session Equipment Utilized During Treatment: Gait belt Activity Tolerance: Patient tolerated treatment well Patient left: in chair;with call bell/phone within reach Nurse Communication: Mobility status PT Visit Diagnosis: Other abnormalities of gait and mobility (R26.89);Difficulty in walking, not elsewhere classified (R26.2);Muscle weakness (generalized) (M62.81)     Time: 0233-4356 PT Time Calculation (min) (ACUTE ONLY): 17 min  Charges:  $Therapeutic Exercise: 8-22 mins                     Lieutenant Diego PT, DPT 3:48 PM,03/12/22

## 2022-03-12 NOTE — Anesthesia Postprocedure Evaluation (Signed)
Anesthesia Post Note  Patient: [name]  Procedure(s) Performed: COMPUTER ASSISTED TOTAL KNEE ARTHROPLASTY (Right: Knee)  Patient location during evaluation: Nursing Unit Anesthesia Type: Spinal Level of consciousness: awake, oriented and awake and alert Pain management: pain level controlled Vital Signs Assessment: post-procedure vital signs reviewed and stable Respiratory status: spontaneous breathing, respiratory function stable and nonlabored ventilation Cardiovascular status: blood pressure returned to baseline and stable Postop Assessment: no backache and no headache Anesthetic complications: no   No notable events documented.   Last Vitals:  Vitals:   03/11/22 2345 03/12/22 0401  BP: 140/88 (!) 151/76  Pulse: 87 86  Resp: 16   Temp: 36.4 C 36.5 C  SpO2: 95% 94%    Last Pain:  Vitals:   03/12/22 0401  TempSrc: Oral  PainSc:                  Johnna Acosta

## 2022-03-12 NOTE — TOC Progression Note (Addendum)
Transition of Care [facility]) - Progression Note    Patient Details  Name: Brittney [name] MRN: [id number] Date of Birth: 04-16-1930  Transition of Care [facility]) CM/SW [place], RN Phone Number: 03/12/2022, 8:36 AM  Clinical Narrative:     The patient lives at [facility] independent living, she wants to go to rehab, It will cost $330 per day for rehab unless she is inpatient.  Sent to UR to review for inpatient criteria check.  Spoke with Seth Bake at [facility] and she confirms that the patient would be able to go to [facility] for rehab if she is inpatient otherwise it would be $330 per day for rehab.     Expected Discharge Plan and Services                                                 Social Determinants of Health (SDOH) Interventions    Readmission Risk Interventions     No data to display

## 2022-03-12 NOTE — NC FL2 (Signed)
[place] LEVEL OF CARE SCREENING TOOL     IDENTIFICATION  Patient Name: Brittney [name] Birthdate: 1930-06-10 Sex: female Admission Date (Current Location): 03/11/2022  [facility] and [place] Number:  Engineering geologist and Address:         Provider Number: [id number]  Attending Physician Name and Address:  Dereck Leep, MD  Relative Name and Phone Number:       Current Level of Care: Hospital Recommended Level of Care: [place] Prior Approval Number:    Date Approved/Denied:   PASRR Number: [id number]  Discharge Plan: SNF    Current Diagnoses: Patient Active Problem List   Diagnosis Date Noted   Primary osteoarthritis of right knee 01/19/2022   Mass of joint of right elbow 04/14/2021   Closed fracture of distal end of right radius 03/03/2021   Atrial fibrillation ([place]) 07/10/2020   CKD (chronic kidney disease) stage 3, GFR 30-59 ml/min ([place]) 07/14/2019   Essential hypertension 07/14/2019   Coronary artery disease 07/14/2019   Gout 07/14/2019   Acute pancreatitis 07/14/2019   Cerebrovascular disease 10/31/2018   TIA (transient ischemic attack) 08/19/2018   Chronic allergic rhinitis 04/28/2018   Gastroesophageal reflux disease without esophagitis 04/28/2018   Venous insufficiency 04/28/2018   Hoarseness 08/18/2017   Rash and nonspecific skin eruption 08/18/2017   Fracture of left wrist with routine healing 06/21/2017   Bilateral carotid artery disease ([place]) 02/28/2016   Acute non-recurrent frontal sinusitis 09/23/2015   Cough 09/23/2015   Gastritis 09/30/2014   Osteoporosis of multiple sites 05/15/2014   Total knee replacement status 04/27/2014   Primary localized osteoarthrosis, lower leg 04/26/2014   Scaphoid fracture, wrist, closed 04/10/2014   Angina pectoris ([place]) 03/07/2014   Lower extremity pain 09/11/2013   Left carotid bruit 09/11/2013   Arthritis of wrist, right, degenerative 12/12/2012   Hyperlipidemia  01/06/2012   CAD (coronary artery disease) 12/28/2011   Statin intolerance 11/23/2011   Chronic kidney disease, stage 4 (severe) ([place]) 09/09/2011   Vitamin B12 deficiency 07/29/2010   Lymphocytic colitis 07/13/2008   Spinal stenosis of lumbar region 04/05/2008    Orientation RESPIRATION BLADDER Height & Weight     Self, Time, Situation, Place  Normal Continent, External catheter Weight: 67.4 kg Height:  4\' 11"  (149.9 cm)  BEHAVIORAL SYMPTOMS/MOOD NEUROLOGICAL BOWEL NUTRITION STATUS      Continent Diet (regular)  AMBULATORY STATUS COMMUNICATION OF NEEDS Skin     Verbally Normal, Surgical wounds                       Personal Care Assistance Level of Assistance              Functional Limitations Info             SPECIAL CARE FACTORS FREQUENCY  PT (By licensed PT), OT (By licensed OT)     PT Frequency: 5 times per week OT Frequency: 5 times per week            Contractures Contractures Info: Not present    Additional Factors Info  Code Status, Allergies Code Status Info: DNR Allergies Info: Lisinopril, Mixed Grasses, Bee Venom, Azithromycin, Baclofen, Iodine, Morphine And Related, [place] Clam (M. [place]en[place]) Skin Test, Sulfa Antibiotics           Current Medications (03/12/2022):  This is the current hospital active medication list Current Facility-Administered Medications  Medication Dose Route Frequency Provider Last Rate Last Admin   0.9 %  sodium  chloride infusion   Intravenous Continuous Hooten, Laurice Record, MD 100 mL/hr at 03/12/22 0308 New Bag at 03/12/22 0308   acetaminophen (OFIRMEV) IV 1,000 mg  1,000 mg Intravenous Q6H Hooten, Laurice Record, MD 400 mL/hr at 03/12/22 0405 1,000 mg at 03/12/22 0405   acetaminophen (TYLENOL) tablet 325-650 mg  325-650 mg Oral Q6H PRN Hooten, Laurice Record, MD       alum & mag hydroxide-simeth (MAALOX/MYLANTA) 200-200-20 MG/5ML suspension 30 mL  30 mL Oral Q4H PRN Hooten, Laurice Record, MD       apixaban Arne Cleveland) tablet 2.5  mg  2.5 mg Oral Q12H Hooten, Laurice Record, MD   2.5 mg at 03/12/22 0941   aspirin EC tablet 81 mg  81 mg Oral Daily Hooten, Laurice Record, MD   81 mg at 03/12/22 0940   bisacodyl (DULCOLAX) suppository 10 mg  10 mg Rectal Daily PRN Hooten, Laurice Record, MD       calcium carbonate (OS-CAL - dosed in mg of elemental calcium) tablet 1,250 mg  500 mg of elemental calcium Oral BID WC Hooten, Laurice Record, MD   1,250 mg at 03/12/22 4315   cholecalciferol (VITAMIN D3) tablet 2,000 Units  2,000 Units Oral Daily Dereck Leep, MD   2,000 Units at 03/12/22 0940   diphenhydrAMINE (BENADRYL) 12.5 MG/5ML elixir 12.5-25 mg  12.5-25 mg Oral Q4H PRN Hooten, Laurice Record, MD       ferrous sulfate tablet 325 mg  325 mg Oral BID WC Hooten, Laurice Record, MD   325 mg at 03/12/22 0939   fluticasone (FLONASE) 50 MCG/ACT nasal spray 2 spray  2 spray Each Nare PRN Hooten, Laurice Record, MD       HYDROmorphone (DILAUDID) injection 0.5-1 mg  0.5-1 mg Intravenous Q4H PRN Hooten, Laurice Record, MD       loratadine (CLARITIN) tablet 10 mg  10 mg Oral Daily Hooten, Laurice Record, MD   10 mg at 03/12/22 0940   magnesium hydroxide (MILK OF MAGNESIA) suspension 30 mL  30 mL Oral Daily Hooten, Laurice Record, MD   30 mL at 03/11/22 2011   menthol-cetylpyridinium (CEPACOL) lozenge 3 mg  1 lozenge Oral PRN Hooten, Laurice Record, MD       Or   phenol (CHLORASEPTIC) mouth spray 1 spray  1 spray Mouth/Throat PRN Hooten, Laurice Record, MD       metoCLOPramide (REGLAN) tablet 10 mg  10 mg Oral TID AC & HS Hooten, Laurice Record, MD   10 mg at 03/11/22 2217   metoprolol succinate (TOPROL-XL) 24 hr tablet 25 mg  25 mg Oral Daily Hooten, Laurice Record, MD   25 mg at 03/12/22 0942   olopatadine (PATANOL) 0.1 % ophthalmic solution 1 drop  1 drop Both Eyes Daily PRN Hooten, Laurice Record, MD       ondansetron (ZOFRAN) tablet 4 mg  4 mg Oral Q6H PRN Hooten, Laurice Record, MD       Or   ondansetron (ZOFRAN) injection 4 mg  4 mg Intravenous Q6H PRN Hooten, Laurice Record, MD       oxyCODONE (Oxy IR/ROXICODONE) immediate release tablet 10 mg   10 mg Oral Q4H PRN Hooten, Laurice Record, MD       oxyCODONE (Oxy IR/ROXICODONE) immediate release tablet 5 mg  5 mg Oral Q4H PRN Hooten, Laurice Record, MD       pantoprazole (PROTONIX) EC tablet 40 mg  40 mg Oral BID Dereck Leep, MD   40 mg at 03/11/22 2217  rosuvastatin (CRESTOR) tablet 5 mg  5 mg Oral Daily Hooten, Laurice Record, MD   5 mg at 03/11/22 2217   senna-docusate (Senokot-S) tablet 1 tablet  1 tablet Oral BID Dereck Leep, MD   1 tablet at 03/11/22 2217   sodium phosphate (FLEET) 7-19 GM/118ML enema 1 enema  1 enema Rectal Once PRN Hooten, Laurice Record, MD       traMADol Veatrice Bourbon) tablet 50-100 mg  50-100 mg Oral Q4H PRN Hooten, Laurice Record, MD       vitamin B-12 (CYANOCOBALAMIN) tablet 500 mcg  500 mcg Oral Daily Hooten, Laurice Record, MD   500 mcg at 03/12/22 0940     Discharge Medications: Please see discharge summary for a list of discharge medications.  Relevant Imaging Results:  Relevant Lab Results:   Additional Information SS# [id number]  Conception Oms, RN

## 2022-03-12 NOTE — Discharge Summary (Signed)
Physician Discharge Summary  Patient ID: Brittney [name] MRN: [id number] DOB/AGE: 10/28/1929 88 y.o.  Admit date: 03/11/2022 Discharge date: 03/12/2022  Admission Diagnoses:  Total knee replacement status [Z96.659]  Surgeries:Procedure(s): Right total knee arthroplasty using computer-assisted navigation   SURGEON:  Marciano Sequin. M.D.   ASSISTANT: Cassell Smiles, PA-C (present and scrubbed throughout the case, critical for assistance with exposure, retraction, instrumentation, and closure)   ANESTHESIA: spinal   ESTIMATED BLOOD LOSS: 50 mL   FLUIDS REPLACED: 1000 mL of crystalloid   TOURNIQUET TIME: 74 minutes   DRAINS: 2 medium Hemovac drains   SOFT TISSUE RELEASES: Anterior cruciate ligament, posterior cruciate ligament, deep medial collateral ligament, patellofemoral ligament   IMPLANTS UTILIZED: DePuy Attune size 4 posterior stabilized femoral component (cemented), size 4 rotating platform tibial component (cemented), 38 mm medialized dome patella (cemented), and an 8 mm stabilized rotating platform polyethylene insert.  Discharge Diagnoses: Patient Active Problem List   Diagnosis Date Noted   Primary osteoarthritis of right knee 01/19/2022   Mass of joint of right elbow 04/14/2021   Closed fracture of distal end of right radius 03/03/2021   Atrial fibrillation ([place]) 07/10/2020   CKD (chronic kidney disease) stage 3, GFR 30-59 ml/min ([place]) 07/14/2019   Essential hypertension 07/14/2019   Coronary artery disease 07/14/2019   Gout 07/14/2019   Acute pancreatitis 07/14/2019   Cerebrovascular disease 10/31/2018   TIA (transient ischemic attack) 08/19/2018   Chronic allergic rhinitis 04/28/2018   Gastroesophageal reflux disease without esophagitis 04/28/2018   Venous insufficiency 04/28/2018   Hoarseness 08/18/2017   Rash and nonspecific skin eruption 08/18/2017   Fracture of left wrist with routine healing 06/21/2017   Bilateral carotid artery disease ([place])  02/28/2016   Acute non-recurrent frontal sinusitis 09/23/2015   Cough 09/23/2015   Gastritis 09/30/2014   Osteoporosis of multiple sites 05/15/2014   Total knee replacement status 04/27/2014   Primary localized osteoarthrosis, lower leg 04/26/2014   Scaphoid fracture, wrist, closed 04/10/2014   Angina pectoris ([place]) 03/07/2014   Lower extremity pain 09/11/2013   Left carotid bruit 09/11/2013   Arthritis of wrist, right, degenerative 12/12/2012   Hyperlipidemia 01/06/2012   CAD (coronary artery disease) 12/28/2011   Statin intolerance 11/23/2011   Chronic kidney disease, stage 4 (severe) ([place]) 09/09/2011   Vitamin B12 deficiency 07/29/2010   Lymphocytic colitis 07/13/2008   Spinal stenosis of lumbar region 04/05/2008    Past Medical History:  Diagnosis Date   Adopted person    Allergy    Anemia    Angina pectoris (HCC)    Aortic atherosclerosis (HCC)    Arthritis    Atrial fibrillation (HCC)    a. ) CHA2DS2-VASc = 7 (age x 2, sex, HTN, TIA x2, aortic plaque);  b.) rate/rhythm maintained on oral metoprolol succinate; chronically anticoagulated using dose reduced apixaban.   Bilateral carotid artery disease (HCC)    CAD (coronary artery disease)    a.) LHC 02/09/2007: EF 60%, LVEDP 25 mmHg, normal cors; b.) cCTA 11/27/2011: Ca score 129.73 --> 1.17 RCA, 16.36 LM, 112.21 LAD, 0 LCx; c.) MPI 03/13/2014 - normal.   Cataract    CKD (chronic kidney disease), stage IV (HCC)    Diastolic dysfunction 66/59/9357   a.) LHC 02/09/2008: EF 60%, LVEDP 25 mmHg; b.) TTE 08/07/2010: EF 65%, mild LVH, mild MR, G2DD; b.) TTE 08/20/2018: EF 45-50%, inferior HK, mild LAE; c.) TTE 08/11/2021: EF 45-50%. sev LAE, mild RAE, mild-mod MR/TR   GERD (gastroesophageal reflux disease)    Gout  Hiatal hernia    Hyperlipidemia    Hypertension    Lactose intolerance    LAFB (left anterior fascicular block) 09/08/2021   Left carotid bruit    Long term current use of anticoagulant    a.) apixaban    Lumbar stenosis    Lymphocytic colitis    Osteoporosis    TIA (transient ischemic attack) 07/2018   TIA (transient ischemic attack) 08/2021   Vitamin B12 deficiency    Wears hearing BILATERAL aids    Wears partial (bottom) dentures      Transfusion:    Consultants (if any):   Discharged Condition: Improved  Hospital Course: Brittney [name] is an 88 y.o. female who was admitted 03/11/2022 with a diagnosis of right knee osteoarthritis and went to the operating room on 03/11/2022 and underwent right total knee arthroplasty. The patient received perioperative antibiotics for prophylaxis (see below). The patient tolerated the procedure well and was transported to PACU in stable condition. After meeting PACU criteria, the patient was subsequently transferred to the Orthopaedics/Rehabilitation unit.   The patient received DVT prophylaxis in the form of early mobilization, TED hose and SCDs, and Eliquis . A sacral pad had been placed and heels were elevated off of the bed with rolled towels in order to protect skin integrity. Foley catheter was discontinued on postoperative day #0. Wound drains were discontinued on postoperative day #1. The surgical incision was healing well without signs of infection.  Physical therapy was initiated postoperatively for transfers, gait training, and strengthening. Occupational therapy was initiated for activities of daily living and evaluation for assisted devices. Rehabilitation goals were reviewed in detail with the patient. The patient made steady progress with physical therapy and physical therapy recommended discharge to Skilled nursing facility.   The patient achieved the preliminary goals of this hospitalization and was felt to be medically and orthopaedically appropriate for discharge.  She was given perioperative antibiotics:  Anti-infectives (From admission, onward)    Start     Dose/Rate Route Frequency Ordered Stop   03/11/22 1900  ceFAZolin (ANCEF)  IVPB 2g/100 mL premix        2 g 200 mL/hr over 30 Minutes Intravenous Every 6 hours 03/11/22 1803 03/12/22 0339   03/11/22 1030  ceFAZolin (ANCEF) IVPB 2g/100 mL premix        2 g 200 mL/hr over 30 Minutes Intravenous On call to O.R. 03/11/22 1018 03/11/22 1348   03/11/22 1025  ceFAZolin (ANCEF) 2-4 GM/100ML-% IVPB       Note to Pharmacy: Jordan Hawks H: cabinet override      03/11/22 1025 03/11/22 1320     .  Recent vital signs:  Vitals:   03/12/22 0401 03/12/22 1007  BP: (!) 151/76 (!) 141/70  Pulse: 86 78  Resp:  18  Temp: 97.7 F (36.5 C) 97.6 F (36.4 C)  SpO2: 94% 98%    Recent laboratory studies:  No results for input(s): "WBC", "HGB", "HCT", "PLT", "K", "CL", "CO2", "BUN", "CREATININE", "GLUCOSE", "CALCIUM", "LABPT", "INR" in the last 72 hours.  Diagnostic Studies: DG Knee Right Port  Result Date: 03/11/2022 CLINICAL DATA:  Follow-up knee replacement EXAM: PORTABLE RIGHT KNEE - 1-2 VIEW COMPARISON:  None Available. FINDINGS: Two-view show total knee replacement on the right. Components appear well positioned without radiographically detectable complication. Drains in place in the suprapatellar recess. IMPRESSION: Good appearance following right total knee replacement. Electronically Signed   By: Nelson Chimes M.D.   On: 03/11/2022 16:34   DG Outside Films  Extremity  Result Date: 03/11/2022 This examination belongs to an outside facility and is stored here for comparison purposes only.  Contact the originating outside institution for any associated report or interpretation.   Discharge Medications:   Allergies as of 03/12/2022       Reactions   Lisinopril Swelling   Mixed Grasses Anaphylaxis   Bee Venom Swelling   Azithromycin Diarrhea   Baclofen Diarrhea   Iodine    CKD   Morphine And Related Nausea And Vomiting   [employer] fundraiser (m. Mercenaria) Skin Test Diarrhea   Mussels.   Sulfa Antibiotics Rash        Medication List     STOP taking  these medications    aspirin EC 81 MG tablet       TAKE these medications    acetaminophen 500 MG tablet Commonly known as: TYLENOL Take 1,000 mg by mouth 2 (two) times daily.   calcium carbonate 1500 (600 Ca) MG Tabs tablet Commonly known as: OSCAL Take 600 mg of elemental calcium by mouth 2 (two) times daily with a meal.   Eliquis 2.5 MG Tabs tablet Generic drug: apixaban TAKE 1 TABLET BY MOUTH EVERY 12 HOURS.   EPINEPHrine 0.3 mg/0.3 mL Soaj injection Commonly known as: EPI-PEN Inject 0.3 mg into the muscle as needed for anaphylaxis.   fluticasone 50 MCG/ACT nasal spray Commonly known as: FLONASE Place 2 sprays into the nose as needed.   loratadine 10 MG tablet Commonly known as: CLARITIN Take 10 mg by mouth daily.   metoprolol succinate 25 MG 24 hr tablet Commonly known as: TOPROL-XL TAKE 1 TABLET DAILY   oxyCODONE 5 MG immediate release tablet Commonly known as: Oxy IR/ROXICODONE Take 1 tablet (5 mg total) by mouth every 4 (four) hours as needed for severe pain.   Pazeo 0.7 % Soln Generic drug: Olopatadine HCl Place 1 drop into both eyes daily as needed.   rosuvastatin 10 MG tablet Commonly known as: CRESTOR TAKE 1/2 TABLET (5MG  TOTAL) BY MOUTH DAILY What changed: See the new instructions.   traMADol 50 MG tablet Commonly known as: ULTRAM Take 1 tablet (50 mg total) by mouth every 4 (four) hours as needed for moderate pain.   triamcinolone cream 0.1 % Commonly known as: KENALOG Apply 1 Application topically 2 (two) times daily as needed.   vitamin B-12 1000 MCG tablet Commonly known as: CYANOCOBALAMIN Take 1 tablet (1,000 mcg total) by mouth daily. What changed: how much to take   Vitamin D 50 MCG (2000 UT) tablet Take 2,000 Units by mouth daily.               Durable Medical Equipment  (From admission, onward)           Start     Ordered   03/11/22 1804  DME Walker rolling  Once       Question:  Patient needs a walker to treat  with the following condition  Answer:  Total knee replacement status   03/11/22 1803   03/11/22 1804  DME Bedside commode  Once       Question:  Patient needs a bedside commode to treat with the following condition  Answer:  Total knee replacement status   03/11/22 1803            Disposition: Skilled nursing facility     Contact information for follow-up providers     Tamala Julian B, PA-C Follow up on 03/26/2022.   Specialty: Orthopedic Surgery Why:  at 9:45am Contact information: [place] [address] [phone]         Dereck Leep, MD Follow up on 04/23/2022.   Specialty: Orthopedic Surgery Why: at 2:15pm Contact information: [address] [facility] [place] [address] [phone]              Contact information for after-discharge care     Destination     [facility] .   Service: Skilled Nursing Contact information: [place], PA-C 03/12/2022, 3:03 PM

## 2022-03-12 NOTE — Evaluation (Signed)
Occupational Therapy Evaluation Patient Details Name: Brittney [name] MRN: [id number] DOB: 1930/08/12 Today's Date: 03/12/2022   History of Present Illness Pt is a 88 yo s/p R TKA. PMH of HTN, afib, CAD, CKD, L TKA.   Clinical Impression   Upon entering the room, pt seated in recliner chair and agreeable to OT intervention. Pt is pleasant and cooperative overall. OT educated pt on use of polar care and how to increase Ind for LB self care. Pt stands from recliner chair at mod I level and ambulates with supervision to bathroom for toileting needs. Pt performs hygiene while seated and manages LB clothing management at supervision level with no LOB noted. Pt standing at sink for hand hygiene at mod I level before returning back to room at end of session. Pt with no further acute OT needs at this time. OT to SIGN OFF.      Recommendations for follow up therapy are one component of a multi-disciplinary discharge planning process, led by the attending physician.  Recommendations may be updated based on patient status, additional functional criteria and insurance authorization.   Follow Up Recommendations  No OT follow up    Assistance Recommended at Discharge PRN  Patient can return home with the following Assistance with cooking/housework;Assist for transportation    Functional Status Assessment  Patient has not had a recent decline in their functional status  Equipment Recommendations  None recommended by OT       Precautions / Restrictions Precautions Precautions: Fall Restrictions Weight Bearing Restrictions: Yes RLE Weight Bearing: Weight bearing as tolerated      Mobility Bed Mobility               General bed mobility comments: seated in recliner chair    Transfers Overall transfer level: Needs assistance Equipment used: Rolling walker (2 wheels) Transfers: Sit to/from Stand, Bed to chair/wheelchair/BSC Sit to Stand: Supervision, Min guard     Step pivot  transfers: Min guard            Balance Overall balance assessment: Needs assistance Sitting-balance support: Feet supported Sitting balance-Leahy Scale: Good       Standing balance-Leahy Scale: Fair                             ADL either performed or assessed with clinical judgement   ADL                                         General ADL Comments: supervision overall for self care and functional transfers with use of RW     Vision Patient Visual Report: No change from baseline              Pertinent Vitals/Pain Pain Assessment Pain Assessment: 0-10 Pain Score: 2  Pain Location: with R knee bending Pain Descriptors / Indicators: Aching, Sore Pain Intervention(s): Monitored during session, Premedicated before session, Repositioned, Ice applied     Hand Dominance Right   Extremity/Trunk Assessment Upper Extremity Assessment Upper Extremity Assessment: Overall WFL for tasks assessed   Lower Extremity Assessment Lower Extremity Assessment: Defer to PT evaluation   Cervical / Trunk Assessment Cervical / Trunk Assessment: Normal   Communication Communication Communication: No difficulties   Cognition Arousal/Alertness: Awake/alert Behavior During Therapy: WFL for tasks assessed/performed Overall Cognitive Status: Within Functional Limits for tasks assessed  Home Living Family/patient expects to be discharged to:: Private residence Living Arrangements: Alone Available Help at Discharge: Neighbor;Other (Comment) Type of Home: Independent living facility Home Access: Stairs to enter Entrance Stairs-Number of Steps: 2 Entrance Stairs-Rails: Can reach both Home Layout: One level     Bathroom Shower/Tub: Occupational psychologist: [place]: Grab bars - toilet;Grab bars - tub/shower;BSC/3in1;Rolling Walker (2 wheels);Shower  seat;Toilet riser          Prior Functioning/Environment Prior Level of Function : Independent/Modified Independent;Driving             Mobility Comments: did report using a RW at night for bathroom for safety                   OT Goals(Current goals can be found in the care plan section) Acute Rehab OT Goals Patient Stated Goal: to go to rehab OT Goal Formulation: With patient Time For Goal Achievement: 03/12/22 Potential to Achieve Goals: Good  OT Frequency:         AM-PAC OT "6 Clicks" Daily Activity     Outcome Measure Help from another person eating meals?: None Help from another person taking care of personal grooming?: None Help from another person toileting, which includes using toliet, bedpan, or urinal?: None Help from another person bathing (including washing, rinsing, drying)?: None Help from another person to put on and taking off regular upper body clothing?: None Help from another person to put on and taking off regular lower body clothing?: None 6 Click Score: 24   End of Session Equipment Utilized During Treatment: Rolling walker (2 wheels) Nurse Communication: Mobility status  Activity Tolerance: Patient tolerated treatment well Patient left: in bed;with call bell/phone within reach  OT Visit Diagnosis: Muscle weakness (generalized) (M62.81)                Time: 0937-1000 OT Time Calculation (min): 23 min Charges:  OT General Charges $OT Visit: 1 Visit OT Evaluation $OT Eval Low Complexity: 1 Low  Darleen Crocker, MS, OTR/L , CBIS ascom [phone]  03/12/22, 12:22 PM

## 2022-03-12 NOTE — Progress Notes (Signed)
Brittney [name] to be D/C'd to [facility] per MD order.  Discussed prescriptions and follow up appointments with the patient. Prescriptions given to patient, medication list explained in detail. Pt verbalized understanding. [facility] will transport pt to facility.  Allergies as of 03/12/2022       Reactions   Lisinopril Swelling   Mixed Grasses Anaphylaxis   Bee Venom Swelling   Azithromycin Diarrhea   Baclofen Diarrhea   Iodine    CKD   Morphine And Related Nausea And Vomiting   [employer] fundraiser (m. [place]enaria) Skin Test Diarrhea   Mussels.   Sulfa Antibiotics Rash        Medication List     STOP taking these medications    aspirin EC 81 MG tablet       TAKE these medications    acetaminophen 500 MG tablet Commonly known as: TYLENOL Take 1,000 mg by mouth 2 (two) times daily.   calcium carbonate 1500 (600 Ca) MG Tabs tablet Commonly known as: OSCAL Take 600 mg of elemental calcium by mouth 2 (two) times daily with a meal.   Eliquis 2.5 MG Tabs tablet Generic drug: apixaban TAKE 1 TABLET BY MOUTH EVERY 12 HOURS.   EPINEPHrine 0.3 mg/0.3 mL Soaj injection Commonly known as: EPI-PEN Inject 0.3 mg into the muscle as needed for anaphylaxis.   fluticasone 50 MCG/ACT nasal spray Commonly known as: FLONASE Place 2 sprays into the nose as needed.   loratadine 10 MG tablet Commonly known as: CLARITIN Take 10 mg by mouth daily.   metoprolol succinate 25 MG 24 hr tablet Commonly known as: TOPROL-XL TAKE 1 TABLET DAILY   oxyCODONE 5 MG immediate release tablet Commonly known as: Oxy IR/ROXICODONE Take 1 tablet (5 mg total) by mouth every 4 (four) hours as needed for severe pain.   Pazeo 0.7 % Soln Generic drug: Olopatadine HCl Place 1 drop into both eyes daily as needed.   rosuvastatin 10 MG tablet Commonly known as: CRESTOR TAKE 1/2 TABLET (5MG  TOTAL) BY MOUTH DAILY What changed: See the new instructions.   traMADol 50 MG tablet Commonly known as:  ULTRAM Take 1 tablet (50 mg total) by mouth every 4 (four) hours as needed for moderate pain.   triamcinolone cream 0.1 % Commonly known as: KENALOG Apply 1 Application topically 2 (two) times daily as needed.   vitamin B-12 1000 MCG tablet Commonly known as: CYANOCOBALAMIN Take 1 tablet (1,000 mcg total) by mouth daily. What changed: how much to take   Vitamin D 50 MCG (2000 UT) tablet Take 2,000 Units by mouth daily.               Durable Medical Equipment  (From admission, onward)           Start     Ordered   03/11/22 1804  DME Walker rolling  Once       Question:  Patient needs a walker to treat with the following condition  Answer:  Total knee replacement status   03/11/22 1803   03/11/22 1804  DME Bedside commode  Once       Question:  Patient needs a bedside commode to treat with the following condition  Answer:  Total knee replacement status   03/11/22 1803            Vitals:   03/12/22 0401 03/12/22 1007  BP: (!) 151/76 (!) 141/70  Pulse: 86 78  Resp:  18  Temp: 97.7 F (36.5 C) 97.6 F (  36.4 C)  SpO2: 94% 98%    Skin clean, dry and intact without evidence of skin break down, no evidence of skin tears noted. IV catheter discontinued intact. Site without signs and symptoms of complications. Dressing and pressure applied. Pt denies pain at this time. No complaints noted.  An After Visit Summary was printed and given to the patient. Patient escorted via WC to discharge lobby. [facility] transport here to pick up pt.  Brittney [name]

## 2022-03-12 NOTE — Evaluation (Addendum)
Physical Therapy Evaluation Patient Details Name: Brittney [name] MRN: [id number] DOB: March 21, 1930 Today's Date: 03/12/2022  History of Present Illness  Pt is a 88 yo s/p R TKA. PMH of HTN, afib, CAD, CKD, L TKA.  Clinical Impression  Patient alert, reported no pain at rest but 8/10 pain with R knee flexion. She stated at baseline she is independent, lives at [facility], performs her own ADLs and IADLs. Did report using a RW at night for safety.  The patient was able to perform several exercises with verbal cueing. Supine to sit modI, good sitting balance noted. Sit <> stand with RW and CGA-supervision, cued for hand placement. She was able to ambulate the nursing loop and improved gait to reciprocal pattern with decreasing antalgia in RLE. Up in room with all needs in reach.  Overall the patient demonstrated deficits (see "PT Problem List") that impede the patient's functional abilities, safety, and mobility and would benefit from skilled PT intervention. Recommendation at this time is to follow physicians recommendation for discharge plan, pending pt ability to obtain assistance at home via [facility].        Recommendations for follow up therapy are one component of a multi-disciplinary discharge planning process, led by the attending physician.  Recommendations may be updated based on patient status, additional functional criteria and insurance authorization.  Follow Up Recommendations Follow physician's recommendations for discharge plan and follow up therapies      Assistance Recommended at Discharge Intermittent Supervision/Assistance  Patient can return home with the following  Assistance with cooking/housework;Assist for transportation;Help with stairs or ramp for entrance    Equipment Recommendations None recommended by PT  Recommendations for Other Services       Functional Status Assessment Patient has had a recent decline in their functional status and demonstrates the  ability to make significant improvements in function in a reasonable and predictable amount of time.     Precautions / Restrictions Precautions Precautions: Fall Restrictions Weight Bearing Restrictions: Yes RLE Weight Bearing: Weight bearing as tolerated      Mobility  Bed Mobility Overal bed mobility: Modified Independent                  Transfers Overall transfer level: Needs assistance Equipment used: Rolling walker (2 wheels) Transfers: Sit to/from Stand, Bed to chair/wheelchair/BSC Sit to Stand: Supervision, Min guard   Step pivot transfers: Min guard            Ambulation/Gait   Gait Distance (Feet): 170 Feet Assistive device: Rolling walker (2 wheels)         General Gait Details: progressed to reciprocal gait pattern with some cueing. overall steady  Financial trader Rankin (Stroke Patients Only)       Balance Overall balance assessment: Needs assistance Sitting-balance support: Feet supported Sitting balance-Leahy Scale: Good       Standing balance-Leahy Scale: Fair                               Pertinent Vitals/Pain Pain Assessment Pain Assessment: 0-10 Pain Score: 8  Pain Location: with R knee bending Pain Descriptors / Indicators: Aching, Sore Pain Intervention(s): Limited activity within patient's tolerance, Monitored during session, Premedicated before session, Repositioned, Ice applied    Home Living Family/patient expects to be discharged to:: Private residence Living Arrangements: Alone Available Help at Discharge:  Neighbor;Other (Comment) (potential resources from [facility] for someone to check in on her) Type of Home: Independent living facility Home Access: Stairs to enter Entrance Stairs-Rails: Can reach both Entrance Stairs-Number of Steps: 2   Home Layout: One level Home Equipment: Grab bars - toilet;Grab bars - tub/shower;BSC/3in1;Rolling Walker (2  wheels);Shower seat;Toilet riser (adjustable bed)      Prior Function Prior Level of Function : Independent/Modified Independent;Driving             Mobility Comments: did report using a RW at night for bathroom for safety       Hand Dominance   Dominant Hand: Right    Extremity/Trunk Assessment   Upper Extremity Assessment Upper Extremity Assessment: Defer to OT evaluation    Lower Extremity Assessment Lower Extremity Assessment: Generalized weakness    Cervical / Trunk Assessment Cervical / Trunk Assessment: Normal  Communication   Communication: No difficulties  Cognition Arousal/Alertness: Awake/alert Behavior During Therapy: WFL for tasks assessed/performed Overall Cognitive Status: Within Functional Limits for tasks assessed                                          General Comments      Exercises     Assessment/Plan    PT Assessment Patient needs continued PT services  PT Problem List Decreased mobility;Decreased strength;Decreased activity tolerance;Decreased balance;Pain       PT Treatment Interventions DME instruction;Therapeutic exercise;Gait training;Balance training;Stair training;Neuromuscular re-education;Functional mobility training;Therapeutic activities;Patient/family education    PT Goals (Current goals can be found in the Care Plan section)  Acute Rehab PT Goals Patient Stated Goal: to go to rehab PT Goal Formulation: With patient Time For Goal Achievement: 03/26/22 Potential to Achieve Goals: Good    Frequency  BID     Co-evaluation               AM-PAC PT "6 Clicks" Mobility  Outcome Measure Help needed turning from your back to your side while in a flat bed without using bedrails?: None Help needed moving from lying on your back to sitting on the side of a flat bed without using bedrails?: None Help needed moving to and from a bed to a chair (including a wheelchair)?: None Help needed standing up from  a chair using your arms (e.g., wheelchair or bedside chair)?: A Little Help needed to walk in hospital room?: A Little Help needed climbing 3-5 steps with a railing? : A Little 6 Click Score: 21    End of Session Equipment Utilized During Treatment: Gait belt Activity Tolerance: Patient tolerated treatment well Patient left: in chair;with call bell/phone within reach Nurse Communication: Mobility status PT Visit Diagnosis: Other abnormalities of gait and mobility (R26.89);Difficulty in walking, not elsewhere classified (R26.2);Muscle weakness (generalized) (M62.81)    Time: 8341-9622 PT Time Calculation (min) (ACUTE ONLY): 28 min   Charges:   PT Evaluation $PT Eval Low Complexity: 1 Low PT Treatments $Therapeutic Exercise: 8-22 mins $Therapeutic Activity: 8-22 mins        Lieutenant Diego PT, DPT 9:46 AM,03/12/22

## 2022-03-12 NOTE — Progress Notes (Signed)
Post-op dressing removed. , Hemovac removed., Mini compression dressing applied. , and Fresh honeycomb dressing applied.   

## 2022-03-12 NOTE — TOC Progression Note (Signed)
Transition of Care [facility]) - Progression Note    Patient Details  Name: Brittney [name] MRN: [id number] Date of Birth: Jul 07, 1930  Transition of Care [facility]) CM/SW [place], RN Phone Number: 03/12/2022, 3:26 PM  Clinical Narrative:     Hessie Knows to provide transportation for the patient and will be at the medical mall entrance at 415, Bedside nurse made aware, DC packet electronically sent to [facility]       Expected Discharge Plan and Services           Expected Discharge Date: 03/12/22                                     Social Determinants of Health (SDOH) Interventions    Readmission Risk Interventions     No data to display

## 2022-03-12 NOTE — Progress Notes (Addendum)
  Subjective: 1 Day Post-Op Procedure(s) (LRB): COMPUTER ASSISTED TOTAL KNEE ARTHROPLASTY (Right) Patient reports pain as well-controlled.   Patient is well, and has had no acute complaints or problems Plan is to go Skilled nursing facility [facility]) after hospital stay. Negative for chest pain and shortness of breath Fever: no Gastrointestinal: negative for nausea and vomiting.  Patient has not had a bowel movement.  Objective: Vital signs in last 24 hours: Temp:  [96.3 F (35.7 C)-98.8 F (37.1 C)] 97.6 F (36.4 C) (07/20 1007) Pulse Rate:  [72-87] 78 (07/20 1007) Resp:  [13-18] 18 (07/20 1007) BP: (131-160)/(66-88) 141/70 (07/20 1007) SpO2:  [92 %-98 %] 98 % (07/20 1007)  Intake/Output from previous day:  Intake/Output Summary (Last 24 hours) at 03/12/2022 1231 Last data filed at 03/12/2022 0407 Gross per 24 hour  Intake 2003.14 ml  Output 470 ml  Net 1533.14 ml    Intake/Output this shift: No intake/output data recorded.  Labs: No results for input(s): "HGB" in the last 72 hours. No results for input(s): "WBC", "RBC", "HCT", "PLT" in the last 72 hours. No results for input(s): "NA", "K", "CL", "CO2", "BUN", "CREATININE", "GLUCOSE", "CALCIUM" in the last 72 hours. No results for input(s): "LABPT", "INR" in the last 72 hours.   EXAM General - Patient is Alert, Appropriate, and Oriented Extremity - Neurovascular intact Dorsiflexion/Plantar flexion intact Compartment soft Dressing/Incision -Postoperative dressing remains in place., Polar Care in place and working. , Hemovac in place.  Motor Function - intact, moving foot and toes well on exam. Able to perform independent SLR.  Cardiovascular- Irregular rate and rhythm, no murmurs/rubs/gallops Respiratory- Lungs clear to auscultation bilaterally Gastrointestinal- soft, nontender, and active bowel sounds   Assessment/Plan: 1 Day Post-Op Procedure(s) (LRB): COMPUTER ASSISTED TOTAL KNEE ARTHROPLASTY  (Right) Principal Problem:   Total knee replacement status  Estimated body mass index is 30.01 kg/m as calculated from the following:   Height as of this encounter: 4\' 11"  (1.499 m).   Weight as of this encounter: 67.4 kg. Advance diet Up with therapy  Plan on d/c to SNF pending insurance approval     DVT Prophylaxis - Eliquis, Ted hose, and SCDs Weight-Bearing as tolerated to right leg  Cassell Smiles, PA-C [facility] 03/12/2022, 12:31 PM

## 2022-03-12 NOTE — TOC Progression Note (Signed)
Transition of Care [facility]) - Progression Note    Patient Details  Name: Brittney [name] MRN: [id number] Date of Birth: Dec 25, 1929  Transition of Care [facility]) CM/SW [place], RN Phone Number: 03/12/2022, 10:03 AM  Clinical Narrative:     The patient will plan to go to [facility] and Pay out of pocket for Rehab at [facility].       Expected Discharge Plan and Services                                                 Social Determinants of Health (SDOH) Interventions    Readmission Risk Interventions     No data to display

## 2022-03-13 DIAGNOSIS — Z96651 Presence of right artificial knee joint: Secondary | ICD-10-CM | POA: Diagnosis not present

## 2022-03-13 DIAGNOSIS — M9190 Juvenile osteochondrosis of hip and pelvis, unspecified, unspecified leg: Secondary | ICD-10-CM | POA: Diagnosis not present

## 2022-03-31 ENCOUNTER — Telehealth: Payer: Self-pay | Admitting: *Deleted

## 2022-03-31 NOTE — Telephone Encounter (Signed)
Patient called and stated that she has Bloodwork scheduled for Thursday at [facility] at 7:30am and transportation does not run at that time.   Patient stated that she just had knee replacement surgery and had a lot of bloodwork done at that time and wonders if you could just use that instead.   Please Advise.

## 2022-04-01 NOTE — Telephone Encounter (Signed)
Patient notified and agreed.  

## 2022-04-01 NOTE — Telephone Encounter (Signed)
Okay to hold off on labs for now, will let her know at next appt if any additional labs are needed

## 2022-04-07 ENCOUNTER — Encounter: Payer: Self-pay | Admitting: Nurse Practitioner

## 2022-04-07 ENCOUNTER — Ambulatory Visit: Payer: Medicare Other | Admitting: Nurse Practitioner

## 2022-04-07 VITALS — BP 118/70 | HR 100 | Temp 98.4°F | Ht 59.0 in | Wt 146.0 lb

## 2022-04-07 DIAGNOSIS — D6869 Other thrombophilia: Secondary | ICD-10-CM | POA: Diagnosis not present

## 2022-04-07 DIAGNOSIS — M1711 Unilateral primary osteoarthritis, right knee: Secondary | ICD-10-CM

## 2022-04-07 DIAGNOSIS — N1831 Chronic kidney disease, stage 3a: Secondary | ICD-10-CM | POA: Diagnosis not present

## 2022-04-07 DIAGNOSIS — I4821 Permanent atrial fibrillation: Secondary | ICD-10-CM | POA: Diagnosis not present

## 2022-04-07 DIAGNOSIS — I2510 Atherosclerotic heart disease of native coronary artery without angina pectoris: Secondary | ICD-10-CM

## 2022-04-07 DIAGNOSIS — E782 Mixed hyperlipidemia: Secondary | ICD-10-CM

## 2022-04-07 DIAGNOSIS — R42 Dizziness and giddiness: Secondary | ICD-10-CM

## 2022-04-07 NOTE — Patient Instructions (Addendum)
Increase water intake. Put glass of water by the bed and drink first thing in the morning.   Increase iron and calcium in diet.   To get labs at [facility] Feb 11th 2024 [facility])

## 2022-04-07 NOTE — Progress Notes (Signed)
Careteam: Patient Care Team: Lauree Chandler, NP as PCP - General (Geriatric Medicine) Kate Sable, MD as PCP - Cardiology (Cardiology)  Advanced Directive information Does Patient Have a Medical Advance Directive?: Yes, Type of Advance Directive: Out of facility DNR (pink MOST or yellow form), Pre-existing out of facility DNR order (yellow form or pink MOST form): Yellow form placed in chart (order not valid for inpatient use), Does patient want to make changes to medical advance directive?: No - Patient declined  Allergies  Allergen Reactions   Lisinopril Swelling   Mixed Grasses Anaphylaxis   Bee Venom Swelling   Azithromycin Diarrhea   Baclofen Diarrhea   Iodine     CKD   Morphine And Related Nausea And Vomiting   [place] Clam (M. [place]en[place]) Skin Test Diarrhea    Mussels.   Sulfa Antibiotics Rash    Chief Complaint  Patient presents with   Medical Management of Chronic Issues    6 month follow-up. Discuss need for shingrix, covid booster, and flu vaccine or post pone if patient refuses. NCIR verified. Patient c/o dizziness. Discuss RSV vaccine recommendations      HPI: Patient is a 88 y.o. female seen in today at the [facility] for routine follow up.   She had total right knee replacement 4 weeks ago. Still doing PT. She feels like she is being inpatient and wants to be further along than what she is.   She reports for the last week she has been dizzy in the morning. Reports late morning it goes away. Reports she gets up and washed up fine but then when she gets into her kitchen she is dizzy.  Does not drink enough water Eats 3 meals a day Only using tylenol for pain management No fevers or chills.    Review of Systems:  Review of Systems  Constitutional:  Negative for chills, fever and weight loss.  HENT:  Negative for tinnitus.   Respiratory:  Negative for cough, sputum production and shortness of breath.   Cardiovascular:  Negative  for chest pain, palpitations and leg swelling.  Gastrointestinal:  Negative for abdominal pain, constipation, diarrhea and heartburn.  Genitourinary:  Negative for dysuria, frequency and urgency.  Musculoskeletal:  Negative for back pain, falls, joint pain and myalgias.  Skin: Negative.   Neurological:  Positive for dizziness. Negative for headaches.  Psychiatric/Behavioral:  Negative for depression and memory loss. The patient does not have insomnia.     Past Medical History:  Diagnosis Date   Adopted person    Allergy    Anemia    Angina pectoris (HCC)    Aortic atherosclerosis (HCC)    Arthritis    Atrial fibrillation (HCC)    a. ) CHA2DS2-VASc = 7 (age x 2, sex, HTN, TIA x2, aortic plaque);  b.) rate/rhythm maintained on oral metoprolol succinate; chronically anticoagulated using dose reduced apixaban.   Bilateral carotid artery disease (HCC)    CAD (coronary artery disease)    a.) LHC 02/09/2007: EF 60%, LVEDP 25 mmHg, normal cors; b.) cCTA 11/27/2011: Ca score 129.73 --> 1.17 RCA, 16.36 LM, 112.21 LAD, 0 LCx; c.) MPI 03/13/2014 - normal.   Cataract    CKD (chronic kidney disease), stage IV (HCC)    Diastolic dysfunction 94/50/3888   a.) LHC 02/09/2008: EF 60%, LVEDP 25 mmHg; b.) TTE 08/07/2010: EF 65%, mild LVH, mild MR, G2DD; b.) TTE 08/20/2018: EF 45-50%, inferior HK, mild LAE; c.) TTE 08/11/2021: EF 45-50%. 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   Left carotid bruit    Long term current use of anticoagulant    a.) apixaban   Lumbar stenosis    Lymphocytic colitis    Osteoporosis    TIA (transient ischemic attack) 07/2018   TIA (transient ischemic attack) 08/2021   Vitamin B12 deficiency    Wears hearing BILATERAL aids    Wears partial (bottom) dentures    Past Surgical History:  Procedure  Laterality Date   BREAST EXCISIONAL BIOPSY Right 36 years ago   neg   BUNIONECTOMY Bilateral    COLONOSCOPY N/A 07/02/2008   KNEE ARTHROPLASTY Right 03/11/2022   Procedure: COMPUTER ASSISTED TOTAL KNEE ARTHROPLASTY;  Surgeon: Dereck Leep, MD;  Location: [facility] ORS;  Service: Orthopedics;  Laterality: Right;   KNEE ARTHROSCOPY Right 2006   LEFT HEART CATH AND CORONARY ANGIOGRAPHY Left 02/09/2007   Procedure: LEFT HEART CATH AND CORONARY ANGIOGRAPHY; Location: [place] Bilateral    TOTAL ABDOMINAL HYSTERECTOMY W/ BILATERAL SALPINGOOPHORECTOMY N/A    TOTAL KNEE ARTHROPLASTY Left 04/26/2014   TUBAL LIGATION     Social History:   reports that she has never smoked. She has never used smokeless tobacco. She reports that she does not currently use alcohol. She reports that she does not use drugs.  Family History  Adopted: Yes  Problem Relation Age of Onset   Heart disease Sister    Stroke Brother     Medications: Patient's Medications  New Prescriptions   No medications on file  Previous Medications   ACETAMINOPHEN (TYLENOL) 500 MG TABLET    Take 1,000 mg by mouth 2 (two) times daily.   APIXABAN (ELIQUIS) 2.5 MG TABS TABLET    TAKE 1 TABLET BY MOUTH EVERY 12 HOURS.   CALCIUM CARBONATE (OSCAL) 1500 (600 CA) MG TABS TABLET    Take 600 mg of elemental calcium by mouth 2 (two) times daily with a meal.   CHOLECALCIFEROL (VITAMIN D) 50 MCG (2000 UT) TABLET    Take 2,000 Units by mouth daily.   EPINEPHRINE 0.3 MG/0.3 ML IJ SOAJ INJECTION    Inject 0.3 mg into the muscle as needed for anaphylaxis.   FLUTICASONE (FLONASE) 50 MCG/ACT NASAL SPRAY    Place 2 sprays into the nose as needed.   LORATADINE (CLARITIN) 10 MG TABLET    Take 10 mg by mouth daily.   METOPROLOL SUCCINATE (TOPROL-XL) 25 MG 24 HR TABLET    TAKE 1 TABLET DAILY   OLOPATADINE HCL (PAZEO) 0.7 % SOLN    Place 1 drop into both eyes daily as needed.   ROSUVASTATIN (CRESTOR) 10 MG TABLET     TAKE 1/2 TABLET (5MG  TOTAL) BY MOUTH DAILY   TRIAMCINOLONE CREAM (KENALOG) 0.1 %    Apply 1 Application topically 2 (two) times daily as needed.   VITAMIN B-12 (CYANOCOBALAMIN) 1000 MCG TABLET    Take 1 tablet (1,000 mcg total) by mouth daily.  Modified Medications   No medications on file  Discontinued Medications   OXYCODONE (OXY IR/ROXICODONE) 5 MG IMMEDIATE RELEASE TABLET    Take 1 tablet (5 mg total) by mouth every 4 (four) hours as needed for severe pain.   TRAMADOL (ULTRAM) 50 MG TABLET    Take 1 tablet (50 mg total)  by mouth every 4 (four) hours as needed for moderate pain.    Physical Exam:  Vitals:   04/07/22 0851  BP: 118/70  Pulse: 100  Temp: 98.4 F (36.9 C)  TempSrc: Temporal  SpO2: 97%  Weight: 146 lb (66.2 kg)  Height: 4\' 11"  (1.499 m)   Body mass index is 29.49 kg/m. Wt Readings from Last 3 Encounters:  04/07/22 146 lb (66.2 kg)  03/11/22 148 lb 9.6 oz (67.4 kg)  03/05/22 148 lb 9.6 oz (67.4 kg)    Physical Exam Constitutional:      General: She is not in acute distress.    Appearance: She is well-developed. She is not diaphoretic.  HENT:     Head: Normocephalic and atraumatic.     Right Ear: Tympanic membrane and ear canal normal.     Left Ear: Tympanic membrane and ear canal normal.     Mouth/Throat:     Pharynx: No oropharyngeal exudate.  Eyes:     Conjunctiva/sclera: Conjunctivae normal.     Pupils: Pupils are equal, round, and reactive to light.  Cardiovascular:     Rate and Rhythm: Normal rate and regular rhythm.     Heart sounds: Normal heart sounds.  Pulmonary:     Effort: Pulmonary effort is normal.     Breath sounds: Normal breath sounds.  Abdominal:     General: Bowel sounds are normal.     Palpations: Abdomen is soft.  Musculoskeletal:        General: Tenderness (and mild swelling to right knee) present.     Cervical back: Normal range of motion and neck supple.     Right lower leg: No edema.     Left lower leg: No edema.  Skin:     General: Skin is warm and dry.  Neurological:     Mental Status: She is alert and oriented to person, place, and time. Mental status is at baseline.  Psychiatric:        Mood and Affect: Mood normal.     Labs reviewed: Basic Metabolic Panel: Recent Labs    06/04/21 0000 09/04/21 0000 02/27/22 1257  NA 137 138 140  K 4.8 4.6 4.2  CL 104 105 110  CO2 25* 27* 23  GLUCOSE  --   --  98  BUN 34* 30* 34*  CREATININE 1.6* 1.5* 1.47*  CALCIUM 9.9 9.6 9.4  TSH  --  3.56  --    Liver Function Tests: Recent Labs    06/04/21 0000 09/04/21 0000 02/27/22 1257  AST 19 14 17   ALT 8 6* 10  ALKPHOS 57 45 49  BILITOT  --   --  1.2  PROT  --   --  6.4*  ALBUMIN 4.0 4.1 3.9   No results for input(s): "LIPASE", "AMYLASE" in the last 8760 hours. No results for input(s): "AMMONIA" in the last 8760 hours. CBC: Recent Labs    06/04/21 0000 09/04/21 0000 02/27/22 1257  WBC 7.0 7.3 6.8  NEUTROABS  --  4,584.00  --   HGB 13.6 13.0 11.9*  HCT 41 39 37.3  MCV  --   --  98.4  PLT 378 412* 360   Lipid Panel: Recent Labs    06/04/21 0000  CHOL 121  HDL 56  LDLCALC 46  TRIG 100   TSH: Recent Labs    09/04/21 0000  TSH 3.56   A1C: Lab Results  Component Value Date   HGBA1C 5.4 08/20/2018  Assessment/Plan 1. Permanent atrial fibrillation (HCC) -stable, rate controlled, continues on metoprolol.   2. Stage 3a chronic kidney disease (HCC) -Chronic and stable Encourage proper hydration Follow metabolic panel Avoid nephrotoxic meds (NSAIDS)  3. Acquired thrombophilia ([place]) -due to afib, continues on eliquis.   4. Primary osteoarthritis of right knee -s/p total knee replacement, pain controlled, increasing mobility and progress doing well.   5. Mixed hyperlipidemia Continues on crestor, will update lipids prior to next visit  6. Dizziness Lasting a few hours in the morning, will have her increase hydration at this time to see if this improves symptoms.  -if  persist will update lab work   Next appt: 6 months, labs prior  [place] K. [place], [facility] [phone]

## 2022-04-09 ENCOUNTER — Other Ambulatory Visit: Payer: Self-pay | Admitting: Nurse Practitioner

## 2022-04-09 DIAGNOSIS — E782 Mixed hyperlipidemia: Secondary | ICD-10-CM

## 2022-05-15 ENCOUNTER — Other Ambulatory Visit: Payer: Self-pay | Admitting: Nurse Practitioner

## 2022-07-11 ENCOUNTER — Other Ambulatory Visit: Payer: Self-pay | Admitting: Nurse Practitioner

## 2022-07-11 DIAGNOSIS — E782 Mixed hyperlipidemia: Secondary | ICD-10-CM

## 2022-08-20 ENCOUNTER — Other Ambulatory Visit: Payer: Self-pay | Admitting: Nurse Practitioner

## 2022-09-02 NOTE — Progress Notes (Signed)
Cardiology Office Note    Date:  09/04/2022   ID:  Brittney [name] 1930-07-02, MRN [id number]  PCP:  Brittney Chandler, NP  Cardiologist:  Brittney Sable, MD  Electrophysiologist:  None   Chief Complaint: Follow-up  History of Present Illness:   Brittney [name] is a 89 y.o. female with history of permanent A-fib, systolic dysfunction, TIA, CKD stage III, HTN, and HLD who presents for follow-up of A-fib.   She was previously followed by [facility], and underwent cardiac cath in 2008 which showed no angiographically apparent CAD in the major epicardial arteries.  Calcium score in 2013 of 129.73.  Nuclear stress test in 2015 showed no evidence of ischemia with normal LV function.  She was admitted to the hospital in 2019 with a suspected TIA.  Carotid artery ultrasound at that time showed no hemodynamically significant stenosis in the bilateral carotid arteries with antegrade flow of the bilateral vertebral arteries.  Echo demonstrated an EF of 45 to 50% with hypokinesis of the inferior myocardium and a mildly dilated left atrium.  Notes indicate she was diagnosed with A-fib in 10/2020, and initiated on apixaban.  She established care with our group in 2022.  Subsequent echo in 07/2021 showed a stable LV systolic function with an EF of 45 to 50%, no regional wall motion abnormalities, indeterminate LV diastolic function parameters, normal RV systolic function and ventricular cavity size, severely dilated left atrium, mildly dilated right atrium, mild to moderate mitral regurgitation, mild to moderate tricuspid regurgitation, and an estimated right atrial pressure of 8 mmHg.  She was seen in our office in 08/2021 and reported a possible TIA several weeks prior.  She was initiated on aspirin with continuation of apixaban and referred to neurology with recommendation to be maintained on apixaban and aspirin.  MRI of the brain showed no evidence of acute intracranial abnormality with mild to  moderate chronic small vessel ischemic changes that had progressed from study in 2019 as well as mild generalized cerebral atrophy.  She was last seen in our office in 02/2022 for preoperative cardiac risk stratification for TKA.  She was without symptoms of angina or decompensation.  She comes in doing reasonably well from a cardiac perspective and is without symptoms of angina or decompensation.  She does note a several month history of some exertional shortness of breath/heaviness when walking up an incline without frank chest pain.  Symptoms are largely unchanged when compared to last visit.  Symptoms will last for a couple of seconds and spontaneously resolved.  No falls or symptoms concerning for bleeding.  No dizziness, presyncope, or syncope.  No significant lower extremity swelling.  She does wonder if rosuvastatin has led to some lower extremity discomfort.  She notes that statins have previously led to myalgias.  No symptoms of claudication.   Labs independently reviewed: 02/2022 - potassium 4.2, BUN 34, serum creatinine 1.47, BUN 3.9, AST/ALT normal, Hgb 11.9, PLT 360 08/2021 - TSH normal 05/2021 - TC 121, TG 100, HDL 56, LDL 46  Past Medical History:  Diagnosis Date   Adopted person    Allergy    Anemia    Angina pectoris (HCC)    Aortic atherosclerosis (HCC)    Arthritis    Atrial fibrillation (HCC)    a. ) CHA2DS2-VASc = 7 (age x 2, sex, HTN, TIA x2, aortic plaque);  b.) rate/rhythm maintained on oral metoprolol succinate; chronically anticoagulated using dose reduced apixaban.   Bilateral carotid artery disease ([place])  CAD (coronary artery disease)    a.) LHC 02/09/2007: EF 60%, LVEDP 25 mmHg, normal cors; b.) cCTA 11/27/2011: Ca score 129.73 --> 1.17 RCA, 16.36 LM, 112.21 LAD, 0 LCx; c.) MPI 03/13/2014 - normal.   Cataract    CKD (chronic kidney disease), stage IV (HCC)    Diastolic dysfunction 65/68/1275   a.) LHC 02/09/2008: EF 60%, LVEDP 25 mmHg; b.) TTE 08/07/2010: EF  65%, mild LVH, mild MR, G2DD; b.) TTE 08/20/2018: EF 45-50%, inferior HK, mild LAE; c.) TTE 08/11/2021: EF 45-50%. 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   Left carotid bruit    Long term current use of anticoagulant    a.) apixaban   Lumbar stenosis    Lymphocytic colitis    Osteoporosis    TIA (transient ischemic attack) 07/2018   TIA (transient ischemic attack) 08/2021   Vitamin B12 deficiency    Wears hearing BILATERAL aids    Wears partial (bottom) dentures     Past Surgical History:  Procedure Laterality Date   BREAST EXCISIONAL BIOPSY Right 36 years ago   neg   BUNIONECTOMY Bilateral    COLONOSCOPY N/A 07/02/2008   KNEE ARTHROPLASTY Right 03/11/2022   Procedure: COMPUTER ASSISTED TOTAL KNEE ARTHROPLASTY;  Surgeon: Dereck Leep, MD;  Location: [facility] ORS;  Service: Orthopedics;  Laterality: Right;   KNEE ARTHROSCOPY Right 2006   LEFT HEART CATH AND CORONARY ANGIOGRAPHY Left 02/09/2007   Procedure: LEFT HEART CATH AND CORONARY ANGIOGRAPHY; Location: [place] Bilateral    TOTAL ABDOMINAL HYSTERECTOMY W/ BILATERAL SALPINGOOPHORECTOMY N/A    TOTAL KNEE ARTHROPLASTY Left 04/26/2014   TUBAL LIGATION      Current Medications: Current Meds  Medication Sig   acetaminophen (TYLENOL) 500 MG tablet Take 1,000 mg by mouth 2 (two) times daily.   apixaban (ELIQUIS) 2.5 MG TABS tablet TAKE 1 TABLET BY MOUTH EVERY 12 HOURS.   Cholecalciferol (VITAMIN D) 50 MCG (2000 UT) tablet Take 2,000 Units by mouth daily.   EPINEPHrine 0.3 mg/0.3 mL IJ SOAJ injection Inject 0.3 mg into the muscle as needed for anaphylaxis.   fluticasone (FLONASE) 50 MCG/ACT nasal spray Place 2 sprays into the nose as needed.   loratadine (CLARITIN) 10 MG tablet Take 10 mg by mouth daily.   metoprolol  succinate (TOPROL-XL) 25 MG 24 hr tablet TAKE 1 TABLET DAILY   Olopatadine HCl (PAZEO) 0.7 % SOLN Place 1 drop into both eyes daily as needed.   rosuvastatin (CRESTOR) 10 MG tablet TAKE 1/2 TABLET (5MG  TOTAL) BY MOUTH DAILY   triamcinolone cream (KENALOG) 0.1 % Apply 1 Application topically 2 (two) times daily as needed.   vitamin B-12 (CYANOCOBALAMIN) 1000 MCG tablet Take 1 tablet (1,000 mcg total) by mouth daily.    Allergies:   Lisinopril, Mixed grasses, Bee venom, Azithromycin, Baclofen, Iodine, Morphine and related, Northern quahog clam (m. mercenaria) skin test, and Sulfa antibiotics   Social History   Socioeconomic History   Marital status: Widowed    Spouse name: Not on file   Number of children: Not on file   Years of education: Not on file   Highest education level: Not on file  Occupational History   Not on file  Tobacco Use   Smoking status: Never   Smokeless tobacco:  Never  Vaping Use   Vaping Use: Never used  Substance and Sexual Activity   Alcohol use: Not Currently   Drug use: Never   Sexual activity: Not Currently  Other Topics Concern   Not on file  Social History Narrative   Not on file   Social Determinants of Health   Financial Resource Strain: Not on file  Food Insecurity: Not on file  Transportation Needs: Not on file  Physical Activity: Not on file  Stress: Not on file  Social Connections: Not on file     Family History:  The patient's family history includes Heart disease in her sister; Stroke in her brother. She was adopted.  ROS:   12-point review of systems is negative unless otherwise noted in the HPI.   EKGs/Labs/Other Studies Reviewed:    Studies reviewed were summarized above. The additional studies were reviewed today:  2D echo 08/11/2021: 1. Left ventricular ejection fraction, by estimation, is 45 to 50%. The  left ventricle has mildly decreased function. The left ventricle has no  regional wall motion abnormalities. Left  ventricular diastolic parameters  are indeterminate.   2. Right ventricular systolic function is normal. The right ventricular  size is normal.   3. Left atrial size was severely dilated.   4. Right atrial size was mildly dilated.   5. The mitral valve is normal in structure. Mild to moderate mitral valve  regurgitation.   6. Tricuspid valve regurgitation is mild to moderate.   7. The aortic valve is tricuspid. Aortic valve regurgitation is not  visualized.   8. The inferior vena cava is normal in size with <50% respiratory  variability, suggesting right atrial pressure of 8 mmHg.  __________  2D echo 08/20/2018: - Left ventricle: The cavity size was mildly dilated. Wall    thickness was normal. Systolic function was mildly reduced. The    estimated ejection fraction was in the range of 45% to 50%.    Hypokinesis of the inferior myocardium.  - Left atrium: The atrium was mildly dilated.   Impressions:   - Mildly reduced LVF    Inferior Hypokinesis    EF=45-50%    Normal Right side. No cardiac source of emboli was indentified.  __________  Carotid artery ultrasound 08/19/2018: IMPRESSION: No hemodynamically significant stenosis is noted in either cervical carotid artery.   EKG:  EKG is ordered today.  The EKG ordered today demonstrates A-fib, 71 bpm, incomplete RBBB consistent with prior tracing  Recent Labs: 09/04/2022: ALT 9; BUN 28; Creatinine, Ser 1.35; Hemoglobin 10.9; Platelets 390; Potassium 4.8; Sodium 139  Recent Lipid Panel    Component Value Date/Time   CHOL 148 09/04/2022 1129   TRIG 104 09/04/2022 1129   HDL 64 09/04/2022 1129   CHOLHDL 2.3 09/04/2022 1129   VLDL 21 09/04/2022 1129   LDLCALC 63 09/04/2022 1129    PHYSICAL EXAM:    VS:  BP (!) 140/64 (BP Location: Left Arm, Patient Position: Sitting, Cuff Size: Normal)   Pulse 71   Ht 4\' 11"  (1.499 m)   Wt 152 lb 12.8 oz (69.3 kg)   SpO2 96%   BMI 30.86 kg/m   BMI: Body mass index is 30.86  kg/m.  Physical Exam Vitals reviewed.  Constitutional:      Appearance: She is well-developed.  HENT:     Head: Normocephalic and atraumatic.  Eyes:     General:        Right eye: No discharge.  Left eye: No discharge.  Neck:     Vascular: No JVD.  Cardiovascular:     Rate and Rhythm: Normal rate. Rhythm irregularly irregular.     Pulses:          Posterior tibial pulses are 2+ on the right side and 2+ on the left side.     Heart sounds: S1 normal and S2 normal. Heart sounds not distant. No midsystolic click and no opening snap. Murmur heard.     High-pitched blowing holosystolic murmur is present with a grade of 1/6 at the apex.     No friction rub.  Pulmonary:     Effort: Pulmonary effort is normal. No respiratory distress.     Breath sounds: Normal breath sounds. No decreased breath sounds, wheezing or rales.  Chest:     Chest wall: No tenderness.  Abdominal:     General: There is no distension.  Musculoskeletal:     Cervical back: Normal range of motion.     Right lower leg: No edema.     Left lower leg: No edema.  Skin:    General: Skin is warm and dry.     Nails: There is no clubbing.  Neurological:     Mental Status: She is alert and oriented to person, place, and time.  Psychiatric:        Speech: Speech normal.        Behavior: Behavior normal.        Thought Content: Thought content normal.        Judgment: Judgment normal.     Wt Readings from Last 3 Encounters:  09/04/22 152 lb 12.8 oz (69.3 kg)  04/07/22 146 lb (66.2 kg)  03/11/22 148 lb 9.6 oz (67.4 kg)     ASSESSMENT & PLAN:   Permanent A-fib: Ventricular rate well-controlled with Toprol-XL.  CHA2DS2-VASc 7 (HTN, age x 2, TIA x 2, vascular disease, sex category).  She remains on apixaban 2.5 mg twice daily given age of 21 and baseline serum creatinine greater than 1.5.  No symptoms concerning for bleeding.  Check CBC and CMP.  Systolic dysfunction: She appears euvolemic and well  compensated, not requiring a standing diuretic.  She remains on Toprol-XL.  Prior ischemic evaluations have been normal.  With some exertional shortness of breath, obtain echo.  Mitral regurgitation: Schedule echo.  HTN: Blood pressure mildly elevated in the office today, though typically well-controlled.  No changes to current therapy.  Continue to monitor.  HLD: LDL 46 in 05/2021.  She remains on rosuvastatin.  Check liver function, lipid panel, and direct LDL.  History of TIA: No new symptoms or residual deficits.  No longer on aspirin.  She remains on apixaban as outlined above.  CKD stage III: Check BMP.     Disposition: F/u with Dr. [name] or an APP in 6 months.   Medication Adjustments/Labs and Tests Ordered: Current medicines are reviewed at length with the patient today.  Concerns regarding medicines are outlined above. Medication changes, Labs and Tests ordered today are summarized above and listed in the Patient Instructions accessible in Encounters.   Signed, Christell Faith, PA-C 09/04/2022 12:59 PM     [address], [place] [address] [phone]

## 2022-09-04 ENCOUNTER — Other Ambulatory Visit
Admission: RE | Admit: 2022-09-04 | Discharge: 2022-09-04 | Disposition: A | Discharge status: Home / Self Care | Payer: Medicare Other | Source: Ambulatory Visit | Attending: Physician Assistant | Admitting: Physician Assistant

## 2022-09-04 ENCOUNTER — Encounter: Payer: Self-pay | Admitting: *Deleted

## 2022-09-04 ENCOUNTER — Encounter: Payer: Self-pay | Admitting: Physician Assistant

## 2022-09-04 ENCOUNTER — Telehealth: Payer: Self-pay | Admitting: *Deleted

## 2022-09-04 ENCOUNTER — Ambulatory Visit: Payer: Medicare Other | Attending: Physician Assistant | Admitting: Physician Assistant

## 2022-09-04 VITALS — BP 140/64 | HR 71 | Ht 59.0 in | Wt 152.8 lb

## 2022-09-04 DIAGNOSIS — I4821 Permanent atrial fibrillation: Secondary | ICD-10-CM | POA: Diagnosis not present

## 2022-09-04 DIAGNOSIS — I519 Heart disease, unspecified: Secondary | ICD-10-CM | POA: Insufficient documentation

## 2022-09-04 DIAGNOSIS — G459 Transient cerebral ischemic attack, unspecified: Secondary | ICD-10-CM | POA: Diagnosis present

## 2022-09-04 DIAGNOSIS — I10 Essential (primary) hypertension: Secondary | ICD-10-CM | POA: Insufficient documentation

## 2022-09-04 DIAGNOSIS — E785 Hyperlipidemia, unspecified: Secondary | ICD-10-CM | POA: Diagnosis not present

## 2022-09-04 DIAGNOSIS — I340 Nonrheumatic mitral (valve) insufficiency: Secondary | ICD-10-CM

## 2022-09-04 LAB — COMPREHENSIVE METABOLIC PANEL
ALT: 9 U/L (ref 0–44)
AST: 18 U/L (ref 15–41)
Albumin: 4 g/dL (ref 3.5–5.0)
Alkaline Phosphatase: 61 U/L (ref 38–126)
Anion gap: 7 (ref 5–15)
BUN: 28 mg/dL — ABNORMAL HIGH (ref 8–23)
CO2: 24 mmol/L (ref 22–32)
Calcium: 9.1 mg/dL (ref 8.9–10.3)
Chloride: 108 mmol/L (ref 98–111)
Creatinine, Ser: 1.35 mg/dL — ABNORMAL HIGH (ref 0.44–1.00)
GFR, Estimated: 37 mL/min — ABNORMAL LOW (ref >60)
Glucose, Bld: 99 mg/dL (ref 70–99)
Potassium: 4.8 mmol/L (ref 3.5–5.1)
Sodium: 139 mmol/L (ref 135–145)
Total Bilirubin: 1.2 mg/dL (ref 0.3–1.2)
Total Protein: 6.5 g/dL (ref 6.5–8.1)

## 2022-09-04 LAB — CBC
HCT: 35.5 % — ABNORMAL LOW (ref 36.0–46.0)
Hemoglobin: 10.9 g/dL — ABNORMAL LOW (ref 12.0–15.0)
MCH: 30 pg (ref 26.0–34.0)
MCHC: 30.7 g/dL (ref 30.0–36.0)
MCV: 97.8 fL (ref 80.0–100.0)
Platelets: 390 10*3/uL (ref 150–400)
RBC: 3.63 MIL/uL — ABNORMAL LOW (ref 3.87–5.11)
RDW: 14 % (ref 11.5–15.5)
WBC: 6.5 10*3/uL (ref 4.0–10.5)
nRBC: 0 % (ref 0.0–0.2)

## 2022-09-04 LAB — LIPID PANEL
Cholesterol: 148 mg/dL (ref 0–200)
HDL: 64 mg/dL (ref >40)
LDL Cholesterol: 63 mg/dL (ref 0–99)
Total CHOL/HDL Ratio: 2.3 RATIO
Triglycerides: 104 mg/dL (ref <150)
VLDL: 21 mg/dL (ref 0–40)

## 2022-09-04 LAB — LDL CHOLESTEROL, DIRECT: Direct LDL: 58 mg/dL (ref 0–99)

## 2022-09-04 MED ORDER — RIVAROXABAN 15 MG PO TABS: 15.0000 mg | ORAL_TABLET | Freq: Every day | ORAL | 3 refills | Status: DC

## 2022-09-04 NOTE — Telephone Encounter (Signed)
Reviewed results and recommendations. After long discussion she prefers option 2 to start xarelto. She requested that we mail her a letter with these results and recommendations. She was appreciative with no further questions at this time.

## 2022-09-04 NOTE — Patient Instructions (Signed)
Medication Instructions:  Your physician has recommended you make the following change in your medication:   HOLD Crestor and let us know how you are feeling.   *If you need a refill on your cardiac medications before your next appointment, please call your pharmacy*   Lab Work: CMET, Lipid, CBC, Direct LDL to be done today. Go to the [place] entrance and check in at registration.  If you have labs (blood work) drawn today and your tests are completely normal, you will receive your results only by: [place] (if you have MyChart) OR A paper copy in the mail If you have any lab test that is abnormal or we need to change your treatment, we will call you to review the results.   Testing/Procedures: Your physician has requested that you have an echocardiogram. Echocardiography is a painless test that uses sound waves to create images of your heart. It provides your doctor with information about the size and shape of your heart and how well your heart's chambers and valves are working. This procedure takes approximately one hour. There are no restrictions for this procedure. Please do NOT wear cologne, perfume, aftershave, or lotions (deodorant is allowed). Please arrive 15 minutes prior to your appointment time.    Follow-Up: At [facility], you and your health needs are our priority.  As part of our continuing mission to provide you with exceptional heart care, we have created designated Provider Care Teams.  These Care Teams include your primary Cardiologist (physician) and Advanced Practice Providers (APPs -  Physician Assistants and Nurse Practitioners) who all work together to provide you with the care you need, when you need it.   Your next appointment:   6 month(s)  Provider:   Kate Sable, MD or Christell Faith, PA-C

## 2022-09-04 NOTE — Telephone Encounter (Signed)
-----  Message from Rise Mu, PA-C sent at 09/04/2022  2:07 PM EST ----- Potassium at goal Mild renal dysfunction, improved from prior Creatinine clearance calculated at 29.8 Normal liver function Mildly reduced blood count Cholesterol well-controlled  Recommendations: -Based on current renal function, patient does not meet reduced dosing criteria for her apixaban.  We have 2 options.  Option #1, increase apixaban to 5 mg twice daily with close monitoring of renal function.  Sometimes this can lead to intermittent dosage changes which can be confusing.  Option #2, we can transition her from apixaban to rivaroxaban 15 mg once daily.  This dosage is made off a different kidney calculation that is more stable and should not cause intermittent dosage changes for her.  Just let me know what she would like to do.

## 2022-09-07 ENCOUNTER — Telehealth: Payer: Self-pay

## 2022-09-07 NOTE — Telephone Encounter (Signed)
Attempted to call patient about her medications. Lvm to return call to ask if she is taking Eliquis or Xarelto.

## 2022-09-08 ENCOUNTER — Telehealth: Payer: Self-pay | Admitting: Cardiology

## 2022-09-08 NOTE — Telephone Encounter (Signed)
Pt c/o medication issue:  1. Name of Medication:   ELIQUIS 2.5 MG TABS tablet [[id number]]   2. How are you currently taking this medication (dosage and times per day)?   3. Are you having a reaction (difficulty breathing--STAT)?   4. What is your medication issue?   Patient stated she was taken off this medication and was put on new medication but would like to know why she was taken off Eliquis and if she can be put back on.

## 2022-09-08 NOTE — Telephone Encounter (Signed)
Spoke with patient and reviewed our previous discussion on why we had switched her medication. See telephone encounter from 1/12. She remembered and had no further questions.

## 2022-09-24 ENCOUNTER — Encounter: Payer: Self-pay | Admitting: Orthopedic Surgery

## 2022-09-24 ENCOUNTER — Telehealth (INDEPENDENT_AMBULATORY_CARE_PROVIDER_SITE_OTHER): Payer: Medicare Other | Admitting: Orthopedic Surgery

## 2022-09-24 DIAGNOSIS — U071 COVID-19: Secondary | ICD-10-CM | POA: Diagnosis not present

## 2022-09-24 MED ORDER — MOLNUPIRAVIR EUA 200MG CAPSULE: 4.0000 | ORAL_CAPSULE | Freq: Two times a day (BID) | ORAL | 0 refills | Status: AC

## 2022-09-24 NOTE — Progress Notes (Signed)
Careteam: Patient Care Team: Lauree Chandler, NP as PCP - General (Geriatric Medicine) Kate Sable, MD as PCP - Cardiology (Cardiology)  Seen by: Windell Moulding, AGNP-C  PLACE OF SERVICE:  [place]  Advanced Directive information    Allergies  Allergen Reactions   Lisinopril Swelling   Mixed Grasses Anaphylaxis   Bee Venom Swelling   Azithromycin Diarrhea   Baclofen Diarrhea   Iodine     CKD   Morphine And Related Nausea And Vomiting   [place] Clam (M. Mercenaria) Skin Test Diarrhea    Mussels.   Sulfa Antibiotics Rash    Chief Complaint  Patient presents with   Acute Visit    Tested positive just feeling tired with no fever or headaches.      HPI: Patient is a 89 y.o. female seen today via virtual visit due to covid.   She tested positive for covid today. Symptoms include: runny nose, sneezing, and productive cough. Denies fever, sore throat or malaise. She denies contact with sick persons, but did recently go see a play. Treatment options discussed. She would like prescription for molnupiravir. She is UTD on all covid vaccines.   Review of Systems:  Review of Systems  Constitutional:  Negative for chills, fever, malaise/fatigue and weight loss.  HENT:  Positive for congestion. Negative for sore throat.   Respiratory:  Positive for cough and sputum production. Negative for shortness of breath and wheezing.   Cardiovascular:  Negative for chest pain and leg swelling.  Gastrointestinal:  Negative for nausea and vomiting.  Musculoskeletal:  Negative for myalgias.  Neurological:  Negative for dizziness and headaches.  Psychiatric/Behavioral:  Negative for depression. The patient is not nervous/anxious.     Past Medical History:  Diagnosis Date   Adopted person    Allergy    Anemia    Angina pectoris (HCC)    Aortic atherosclerosis (HCC)    Arthritis    Atrial fibrillation (HCC)    a. ) CHA2DS2-VASc = 7 (age x 2, sex, HTN, TIA x2, aortic  plaque);  b.) rate/rhythm maintained on oral metoprolol succinate; chronically anticoagulated using dose reduced apixaban.   Bilateral carotid artery disease (HCC)    CAD (coronary artery disease)    a.) LHC 02/09/2007: EF 60%, LVEDP 25 mmHg, normal cors; b.) cCTA 11/27/2011: Ca score 129.73 --> 1.17 RCA, 16.36 LM, 112.21 LAD, 0 LCx; c.) MPI 03/13/2014 - normal.   Cataract    CKD (chronic kidney disease), stage IV (HCC)    Diastolic dysfunction 13/24/4010   a.) LHC 02/09/2008: EF 60%, LVEDP 25 mmHg; b.) TTE 08/07/2010: EF 65%, mild LVH, mild MR, G2DD; b.) TTE 08/20/2018: EF 45-50%, inferior HK, mild LAE; c.) TTE 08/11/2021: EF 45-50%. 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   Left carotid bruit    Long term current use of anticoagulant    a.) apixaban   Lumbar stenosis    Lymphocytic colitis    Osteoporosis    TIA (transient ischemic attack) 07/2018   TIA (transient ischemic attack) 08/2021   Vitamin B12 deficiency    Wears hearing BILATERAL aids    Wears partial (bottom) dentures    Past Surgical History:  Procedure Laterality Date   BREAST EXCISIONAL BIOPSY Right 36 years ago   neg  BUNIONECTOMY Bilateral    COLONOSCOPY N/A 07/02/2008   KNEE ARTHROPLASTY Right 03/11/2022   Procedure: COMPUTER ASSISTED TOTAL KNEE ARTHROPLASTY;  Surgeon: Dereck Leep, MD;  Location: [facility] ORS;  Service: Orthopedics;  Laterality: Right;   KNEE ARTHROSCOPY Right 2006   LEFT HEART CATH AND CORONARY ANGIOGRAPHY Left 02/09/2007   Procedure: LEFT HEART CATH AND CORONARY ANGIOGRAPHY; Location: [place] Bilateral    TOTAL ABDOMINAL HYSTERECTOMY W/ BILATERAL SALPINGOOPHORECTOMY N/A    TOTAL KNEE ARTHROPLASTY Left 04/26/2014   TUBAL LIGATION     Social History:   reports that she has never  smoked. She has never used smokeless tobacco. She reports that she does not currently use alcohol. She reports that she does not use drugs.  Family History  Adopted: Yes  Problem Relation Age of Onset   Heart disease Sister    Stroke Brother     Medications: Patient's Medications  New Prescriptions   No medications on file  Previous Medications   ACETAMINOPHEN (TYLENOL) 500 MG TABLET    Take 1,000 mg by mouth 2 (two) times daily.   CHOLECALCIFEROL (VITAMIN D) 50 MCG (2000 UT) TABLET    Take 2,000 Units by mouth daily.   EPINEPHRINE 0.3 MG/0.3 ML IJ SOAJ INJECTION    Inject 0.3 mg into the muscle as needed for anaphylaxis.   FLUTICASONE (FLONASE) 50 MCG/ACT NASAL SPRAY    Place 2 sprays into the nose as needed.   LORATADINE (CLARITIN) 10 MG TABLET    Take 10 mg by mouth daily.   METOPROLOL SUCCINATE (TOPROL-XL) 25 MG 24 HR TABLET    TAKE 1 TABLET DAILY   OLOPATADINE HCL (PAZEO) 0.7 % SOLN    Place 1 drop into both eyes daily as needed.   RIVAROXABAN (XARELTO) 15 MG TABS TABLET    Take 1 tablet (15 mg total) by mouth daily with supper.   ROSUVASTATIN (CRESTOR) 10 MG TABLET    TAKE 1/2 TABLET (5MG  TOTAL) BY MOUTH DAILY   TRIAMCINOLONE CREAM (KENALOG) 0.1 %    Apply 1 Application topically 2 (two) times daily as needed.   VITAMIN B-12 (CYANOCOBALAMIN) 1000 MCG TABLET    Take 1 tablet (1,000 mcg total) by mouth daily.  Modified Medications   No medications on file  Discontinued Medications   CALCIUM CARBONATE (OSCAL) 1500 (600 CA) MG TABS TABLET    Take 600 mg of elemental calcium by mouth 2 (two) times daily with a meal.    Physical Exam:  There were no vitals filed for this visit. There is no height or weight on file to calculate BMI. Wt Readings from Last 3 Encounters:  09/04/22 152 lb 12.8 oz (69.3 kg)  04/07/22 146 lb (66.2 kg)  03/11/22 148 lb 9.6 oz (67.4 kg)    Physical Exam Vitals reviewed: Exam limited due to vitrual visit.  Constitutional:      General: She is not in  acute distress. Neurological:     Mental Status: She is alert.     Labs reviewed: Basic Metabolic Panel: Recent Labs    02/27/22 1257 09/04/22 1129  NA 140 139  K 4.2 4.8  CL 110 108  CO2 23 24  GLUCOSE 98 99  BUN 34* 28*  CREATININE 1.47* 1.35*  CALCIUM 9.4 9.1   Liver Function Tests: Recent Labs    02/27/22 1257 09/04/22 1129  AST 17 18  ALT 10 9  ALKPHOS 49 61  BILITOT 1.2 1.2  PROT 6.4*  6.5  ALBUMIN 3.9 4.0   No results for input(s): "LIPASE", "AMYLASE" in the last 8760 hours. No results for input(s): "AMMONIA" in the last 8760 hours. CBC: Recent Labs    02/27/22 1257 09/04/22 1129  WBC 6.8 6.5  HGB 11.9* 10.9*  HCT 37.3 35.5*  MCV 98.4 97.8  PLT 360 390   Lipid Panel: Recent Labs    09/04/22 1129  CHOL 148  HDL 64  LDLCALC 63  TRIG 104  CHOLHDL 2.3  LDLDIRECT 58   TSH: No results for input(s): "TSH" in the last 8760 hours. A1C: Lab Results  Component Value Date   HGBA1C 5.4 08/20/2018     Assessment/Plan 1. COVID-19 - tested positive today - symptoms: runny nose, productive cough, sneezing - will start molnupiravir due to GFR 37 and Xarelto use - may also take mucinex for productive cough - advised to contact PCP if symptoms worsen or do not resolve - molnupiravir EUA (LAGEVRIO) 200 mg CAPS capsule; Take 4 capsules (800 mg total) by mouth 2 (two) times daily for 5 days.  Dispense: 40 capsule; Refill: 0  Virtual Visit   I connected with [name] via virtual visit and verified that I am speaking with the correct person using two identifiers.  [place] Patient: Audianna Landgren Patient location: home Provider: Yvonna Alanis, NP    I discussed the limitations, risks, security and privacy concerns of performing an evaluation and management service by telephone and the availability of in person appointments. I also discussed with the patient that there may be a patient responsible charge related to this  service. The patient expressed understanding and agreed to proceed.   I discussed the assessment and treatment plan with the patient. The patient was provided an opportunity to ask questions and all were answered. The patient agreed with the plan and demonstrated an understanding of the instructions.   The patient was advised to call back or seek an in-person evaluation if the symptoms worsen or if the condition fails to improve as anticipated.  I provided 11 minutes of face-to-face time during this encounter.  [place], NP  Avs printed and mailed   Next appt: none Laquasia Pincus [place], [facility] [phone]

## 2022-09-24 NOTE — Patient Instructions (Signed)
Antiviral for Molnupiravir sent to local pharmacy  Rest  Increase fluid intake  Contact Ros[place] if symptoms worsen or do not resolve  It was a pleasure talking with you today! Thank you for showing me Brittney [name]!

## 2022-09-24 NOTE — Progress Notes (Signed)
This service is provided via telemedicine  No vital signs collected/recorded due to the encounter was a telemedicine visit.   Location of patient (ex: home, work):  Home   Patient consents to a telephone visit:  11/25/2021   Location of the provider (ex: office, home):  [facility]  Name of any referring provider:  N/A  Names of all persons participating in the telemedicine service and their role in the encounter: Tanajah Boulter B/CMA, Windell Moulding, NP, and patient  Time spent on call:  11 minutes

## 2022-10-02 ENCOUNTER — Ambulatory Visit: Payer: Medicare Other | Attending: Physician Assistant

## 2022-10-02 DIAGNOSIS — I4821 Permanent atrial fibrillation: Secondary | ICD-10-CM | POA: Diagnosis not present

## 2022-10-02 DIAGNOSIS — I519 Heart disease, unspecified: Secondary | ICD-10-CM | POA: Insufficient documentation

## 2022-10-03 LAB — ECHOCARDIOGRAM COMPLETE
Area-P 1/2: 4.65 cm2
P 1/2 time: 473 msec
S' Lateral: 3.5 cm

## 2022-10-08 ENCOUNTER — Encounter: Payer: Self-pay | Admitting: Nurse Practitioner

## 2022-10-08 ENCOUNTER — Ambulatory Visit: Payer: Medicare Other | Admitting: Nurse Practitioner

## 2022-10-08 ENCOUNTER — Encounter: Payer: Self-pay | Admitting: *Deleted

## 2022-10-08 VITALS — BP 110/70 | HR 79 | Temp 98.5°F | Resp 18 | Ht 59.0 in | Wt 144.5 lb

## 2022-10-08 DIAGNOSIS — D6869 Other thrombophilia: Secondary | ICD-10-CM | POA: Diagnosis not present

## 2022-10-08 DIAGNOSIS — K219 Gastro-esophageal reflux disease without esophagitis: Secondary | ICD-10-CM | POA: Diagnosis not present

## 2022-10-08 DIAGNOSIS — D649 Anemia, unspecified: Secondary | ICD-10-CM

## 2022-10-08 DIAGNOSIS — R198 Other specified symptoms and signs involving the digestive system and abdomen: Secondary | ICD-10-CM

## 2022-10-08 DIAGNOSIS — M810 Age-related osteoporosis without current pathological fracture: Secondary | ICD-10-CM

## 2022-10-08 DIAGNOSIS — N1831 Chronic kidney disease, stage 3a: Secondary | ICD-10-CM

## 2022-10-08 DIAGNOSIS — I25118 Atherosclerotic heart disease of native coronary artery with other forms of angina pectoris: Secondary | ICD-10-CM | POA: Diagnosis not present

## 2022-10-08 DIAGNOSIS — I4821 Permanent atrial fibrillation: Secondary | ICD-10-CM

## 2022-10-08 NOTE — Progress Notes (Signed)
Careteam: Patient Care Team: Lauree Chandler, NP as PCP - General (Geriatric Medicine) Kate Sable, MD as PCP - Cardiology (Cardiology)  PLACE [facility] SERVICE:  [facility]  Advanced Directive information Does Patient Have a Medical Advance Directive?: Yes, Type of Advance Directive: Out of facility DNR (pink MOST or yellow form), Pre-existing out of facility DNR order (yellow form or pink MOST form): Yellow form placed in chart (order not valid for inpatient use), Does patient want to make changes to medical advance directive?: No - Guardian declined  Allergies  Allergen Reactions   Lisinopril Swelling   Mixed Grasses Anaphylaxis   Bee Venom Swelling   Azithromycin Diarrhea   Baclofen Diarrhea   Iodine     CKD   Morphine And Related Nausea And Vomiting   [place] Clam (M. Mercenaria) Skin Test Diarrhea    Mussels.   Sulfa Antibiotics Rash    Chief Complaint  Patient presents with   Medical Management of Chronic Issues    6 month follow up   Immunizations    Shingrix vaccine, Flu vaccine and COVID booster due   Quality Metric Gaps    Medicare annual wellness due.     HPI: Patient is a 89 y.o. female for routine follow up.  She had COVID on 2/1 took paxloid and did well. No ongoing symptoms.   A fib- seeing cardiology, had echo, has not gotten results.   Anemia- reports she has always had some sort of anemia. No dark tarry stools  Sometimes had diarrhea and sometimes has constipation  CKD stable. Trys to drink fluids but not good at it.   She was adopted when she was 39 and she found brother and sister through 34 and me  She found that her brother had a lot of heart and kidney disease and sister had heart disease.   GERD- managed with lifestyle modifications.   Osteoporosis- does not want medication for OP. She takes vit d, walking 4 times a day.   Lower back causes problems- does exercises to help  She was having bad cramps in her legs-  she was taken off crestor due to pains and now pains have resolves.  Review of Systems:  Review of Systems  Constitutional:  Negative for chills, fever and weight loss.  HENT:  Negative for tinnitus.   Respiratory:  Negative for cough, sputum production and shortness of breath.   Cardiovascular:  Negative for chest pain, palpitations and leg swelling.  Gastrointestinal:  Negative for abdominal pain, constipation, diarrhea and heartburn.  Genitourinary:  Negative for dysuria, frequency and urgency.  Musculoskeletal:  Negative for back pain, falls, joint pain and myalgias.  Skin: Negative.   Neurological:  Negative for dizziness and headaches.  Psychiatric/Behavioral:  Negative for depression and memory loss. The patient does not have insomnia.     Past Medical History:  Diagnosis Date   Adopted person    Allergy    Anemia    Angina pectoris (HCC)    Aortic atherosclerosis (HCC)    Arthritis    Atrial fibrillation (HCC)    a. ) CHA2DS2-VASc = 7 (age x 2, sex, HTN, TIA x2, aortic plaque);  b.) rate/rhythm maintained on oral metoprolol succinate; chronically anticoagulated using dose reduced apixaban.   Bilateral carotid artery disease (HCC)    CAD (coronary artery disease)    a.) LHC 02/09/2007: EF 60%, LVEDP 25 mmHg, normal cors; b.) cCTA 11/27/2011: Ca score 129.73 --> 1.17 RCA, 16.36 LM, 112.21  LAD, 0 LCx; c.) MPI 03/13/2014 - normal.   Cataract    CKD (chronic kidney disease), stage IV (HCC)    Diastolic dysfunction [id number]   a.) LHC 02/09/2008: EF 60%, LVEDP 25 mmHg; b.) TTE 08/07/2010: EF 65%, mild LVH, mild MR, G2DD; b.) TTE 08/20/2018: EF 45-50%, inferior HK, mild LAE; c.) TTE 08/11/2021: EF 45-50%. 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   Left carotid bruit    Long term current use of anticoagulant     a.) apixaban   Lumbar stenosis    Lymphocytic colitis    Osteoporosis    TIA (transient ischemic attack) 07/2018   TIA (transient ischemic attack) 08/2021   Vitamin B12 deficiency    Wears hearing BILATERAL aids    Wears partial (bottom) dentures    Past Surgical History:  Procedure Laterality Date   BREAST EXCISIONAL BIOPSY Right 36 years ago   neg   BUNIONECTOMY Bilateral    COLONOSCOPY N/A 07/02/2008   KNEE ARTHROPLASTY Right 03/11/2022   Procedure: COMPUTER ASSISTED TOTAL KNEE ARTHROPLASTY;  Surgeon: Dereck Leep, MD;  Location: [facility] ORS;  Service: Orthopedics;  Laterality: Right;   KNEE ARTHROSCOPY Right 2006   LEFT HEART CATH AND CORONARY ANGIOGRAPHY Left 02/09/2007   Procedure: LEFT HEART CATH AND CORONARY ANGIOGRAPHY; Location: [place] Bilateral    TOTAL ABDOMINAL HYSTERECTOMY W/ BILATERAL SALPINGOOPHORECTOMY N/A    TOTAL KNEE ARTHROPLASTY Left 04/26/2014   TUBAL LIGATION     Social History:   reports that she has never smoked. She has never used smokeless tobacco. She reports that she does not currently use alcohol. She reports that she does not use drugs.  Family History  Adopted: Yes  Problem Relation Age of Onset   Heart disease Sister    Stroke Brother     Medications: Patient's Medications  New Prescriptions   No medications on file  Previous Medications   ACETAMINOPHEN (TYLENOL) 500 MG TABLET    Take 1,000 mg by mouth 2 (two) times daily.   CHOLECALCIFEROL (VITAMIN D) 50 MCG (2000 UT) TABLET    Take 2,000 Units by mouth daily.   EPINEPHRINE 0.3 MG/0.3 ML IJ SOAJ INJECTION    Inject 0.3 mg into the muscle as needed for anaphylaxis.   FLUTICASONE (FLONASE) 50 MCG/ACT NASAL SPRAY    Place 2 sprays into the nose as needed.   LORATADINE (CLARITIN) 10 MG TABLET    Take 10 mg by mouth daily.   METOPROLOL SUCCINATE (TOPROL-XL) 25 MG 24 HR TABLET    TAKE 1 TABLET DAILY   OLOPATADINE HCL (PAZEO) 0.7 % SOLN    Place 1  drop into both eyes daily as needed.   RIVAROXABAN (XARELTO) 15 MG TABS TABLET    Take 1 tablet (15 mg total) by mouth daily with supper.   ROSUVASTATIN (CRESTOR) 10 MG TABLET    TAKE 1/2 TABLET (5MG TOTAL) BY MOUTH DAILY   TRIAMCINOLONE CREAM (KENALOG) 0.1 %    Apply 1 Application topically 2 (two) times daily as needed.   VITAMIN B-12 (CYANOCOBALAMIN) 1000 MCG TABLET    Take 1 tablet (1,000 mcg total) by mouth daily.  Modified Medications   No medications on file  Discontinued Medications   No medications on file  Physical Exam:  Vitals:   10/08/22 1025  BP: 110/70  Pulse: 79  Resp: 18  Temp: 98.5 F (36.9 C)  SpO2: 97%  Weight: 144 lb 8 oz (65.5 kg)  Height: 4' 11"$  (1.499 m)   Body mass index is 29.19 kg/m. Wt Readings from Last 3 Encounters:  10/08/22 144 lb 8 oz (65.5 kg)  09/04/22 152 lb 12.8 oz (69.3 kg)  04/07/22 146 lb (66.2 kg)    Physical Exam Constitutional:      General: She is not in acute distress.    Appearance: She is well-developed. She is not diaphoretic.  HENT:     Head: Normocephalic and atraumatic.     Mouth/Throat:     Pharynx: No oropharyngeal exudate.  Eyes:     Conjunctiva/sclera: Conjunctivae normal.     Pupils: Pupils are equal, round, and reactive to light.  Cardiovascular:     Rate and Rhythm: Normal rate. Rhythm irregular.     Heart sounds: Normal heart sounds.  Pulmonary:     Effort: Pulmonary effort is normal.     Breath sounds: Normal breath sounds.  Abdominal:     General: Bowel sounds are normal.     Palpations: Abdomen is soft.  Musculoskeletal:     Cervical back: Normal range of motion and neck supple.     Right lower leg: No edema.     Left lower leg: No edema.  Skin:    General: Skin is warm and dry.  Neurological:     Mental Status: She is alert and oriented to person, place, and time.  Psychiatric:        Mood and Affect: Mood normal.     Labs reviewed: Basic Metabolic Panel: Recent Labs    02/27/22 1257  09/04/22 1129  NA 140 139  K 4.2 4.8  CL 110 108  CO2 23 24  GLUCOSE 98 99  BUN 34* 28*  CREATININE 1.47* 1.35*  CALCIUM 9.4 9.1   Liver Function Tests: Recent Labs    02/27/22 1257 09/04/22 1129  AST 17 18  ALT 10 9  ALKPHOS 49 61  BILITOT 1.2 1.2  PROT 6.4* 6.5  ALBUMIN 3.9 4.0   No results for input(s): "LIPASE", "AMYLASE" in the last 8760 hours. No results for input(s): "AMMONIA" in the last 8760 hours. CBC: Recent Labs    02/27/22 1257 09/04/22 1129  WBC 6.8 6.5  HGB 11.9* 10.9*  HCT 37.3 35.5*  MCV 98.4 97.8  PLT 360 390   Lipid Panel: Recent Labs    09/04/22 1129  CHOL 148  HDL 64  LDLCALC 63  TRIG 104  CHOLHDL 2.3  LDLDIRECT 58   TSH: No results for input(s): "TSH" in the last 8760 hours. A1C: Lab Results  Component Value Date   HGBA1C 5.4 08/20/2018     Assessment/Plan 1. Acquired thrombophilia ([place]) Due to a fib, continues on xarelto.   2. Coronary artery disease of native artery of native heart with stable angina pectoris (HCC) Stable, without chest pains, continues on metoprolol   3. Gastroesophageal reflux disease without esophagitis Controlled with diet and lifestyle modifications. Uses tums PRN  4. Osteoporosis of multiple sites -Recommended to take calcium 600 mg twice daily with Vitamin D 2000 units daily and weight bearing activity 30 mins/5 days a week  5. Alternating constipation and diarrhea -to increase fiber. To use benefiber 1-2 times daily  6. Permanent atrial fibrillation (HCC) Rate controlled on metoprolol.   7. Stage 3a chronic  kidney disease ([place]) -Chronic and stable Encourage proper hydration Follow metabolic panel Avoid nephrotoxic meds (NSAIDS)  8. Anemia, unspecified type -ongoing, likely multi-factorial.  -to increase iron in diet and monitor     Brandley Aldrete K. [place], [facility] [phone]

## 2022-10-08 NOTE — Patient Instructions (Signed)
To take benefiber 1-2 times daily for constipation and diarrhea  This is a powder that goes into a drink

## 2022-11-25 ENCOUNTER — Other Ambulatory Visit: Payer: Self-pay | Admitting: Nurse Practitioner

## 2022-11-30 ENCOUNTER — Encounter: Payer: Self-pay | Admitting: Nurse Practitioner

## 2022-11-30 NOTE — Progress Notes (Signed)
   This service is provided via telemedicine  No vital signs collected/recorded due to the encounter was a telemedicine visit.   Location of patient (ex: home, work):  Home  Patient consents to a telephone visit: Yes, see telephone visit dated 12/01/2022  Location of the provider (ex: office, home):  [facility] [employer], [place]    Name of any referring provider:  N/A  Names of all persons participating in the telemedicine service and their role in the encounter:  S.Chrae B/CMA, Abbey Chatters, NP, and Patient   Time spent on call:  11 min with medical assistant

## 2022-12-01 ENCOUNTER — Encounter: Payer: Medicare Other | Admitting: Nurse Practitioner

## 2022-12-01 ENCOUNTER — Encounter: Payer: Self-pay | Admitting: Nurse Practitioner

## 2022-12-01 ENCOUNTER — Ambulatory Visit (INDEPENDENT_AMBULATORY_CARE_PROVIDER_SITE_OTHER): Payer: Medicare Other | Admitting: Nurse Practitioner

## 2022-12-01 DIAGNOSIS — Z0000 Encounter for general adult medical examination without abnormal findings: Secondary | ICD-10-CM | POA: Diagnosis not present

## 2022-12-01 NOTE — Progress Notes (Signed)
Subjective:   Brittney GeraldsConstance [name] is a 89 y.o. female who presents for Medicare Annual (Subsequent) preventive examination.  Review of Systems     Cardiac Risk Factors include: advanced age (>6455men, 74>65 women);dyslipidemia;hypertension     Objective:    There were no vitals filed for this visit. There is no height or weight on file to calculate BMI.     11/30/2022    4:45 PM 10/08/2022   10:24 AM 04/07/2022    8:52 AM 03/11/2022   10:38 AM 02/27/2022    2:08 PM 11/25/2021    1:54 PM 10/07/2021   10:10 AM  Advanced Directives  Does Patient Have a Medical Advance Directive? Yes Yes Yes Yes Yes Yes Yes  Type of Estate agentAdvance Directive Healthcare Power of PendletonAttorney;Out of facility DNR (pink MOST or yellow form) Out of facility DNR (pink MOST or yellow form) Out of facility DNR (pink MOST or yellow form) Healthcare Power of MartellAttorney;Living will Healthcare Power of [place]yerAttorney;Living will Healthcare Power of [place]orney;Living will;Out of facility DNR (pink MOST or yellow form) Out of facility DNR (pink MOST or yellow form);Healthcare Power of [place]orney;Living will  Does patient want to make changes to medical advance directive? No - Patient declined No - Guardian declined No - Patient declined No - Patient declined No - Patient declined No - Patient declined   Copy of Healthcare Power of Attorney in Chart? Yes - validated most recent copy scanned in chart (See row information)    No - copy requested No - copy requested No - copy requested  Pre-existing out of facility DNR order (yellow form or pink MOST form) Yellow form placed in chart (order not valid for inpatient use) Yellow form placed in chart (order not valid for inpatient use) Yellow form placed in chart (order not valid for inpatient use)   Yellow form placed in chart (order not valid for inpatient use)     Current Medications (verified) Outpatient Encounter Medications as of 12/01/2022  Medication Sig   acetaminophen (TYLENOL) 500 MG tablet Take 1,000  mg by mouth 2 (two) times daily as needed.   Cholecalciferol (VITAMIN D) 50 MCG (2000 UT) tablet Take 2,000 Units by mouth daily.   EPINEPHrine 0.3 mg/0.3 mL IJ SOAJ injection Inject 0.3 mg into the muscle as needed for anaphylaxis.   fluticasone (FLONASE) 50 MCG/ACT nasal spray Place 2 sprays into the nose as needed.   loratadine (CLARITIN) 10 MG tablet Take 10 mg by mouth daily.   metoprolol succinate (TOPROL-XL) 25 MG 24 hr tablet TAKE 1 TABLET DAILY   Olopatadine HCl (PAZEO) 0.7 % SOLN Place 1 drop into both eyes daily as needed.   Rivaroxaban (XARELTO) 15 MG TABS tablet Take 1 tablet (15 mg total) by mouth daily with supper.   triamcinolone cream (KENALOG) 0.1 % Apply 1 Application topically 2 (two) times daily as needed.   vitamin B-12 (CYANOCOBALAMIN) 1000 MCG tablet Take 1 tablet (1,000 mcg total) by mouth daily.   No facility-administered encounter medications on file as of 12/01/2022.    Allergies (verified) Lisinopril, Mixed grasses, Bee venom, Azithromycin, Baclofen, Iodine, Morphine and related, Northern quahog clam (m. mercenaria) skin test, and Sulfa antibiotics   History: Past Medical History:  Diagnosis Date   Adopted person    Allergy    Anemia    Angina pectoris    Aortic atherosclerosis    Arthritis    Atrial fibrillation    a. ) CHA2DS2-VASc = 7 (age x 2, sex, HTN, TIA  x2, aortic plaque);  b.) rate/rhythm maintained on oral metoprolol succinate; chronically anticoagulated using dose reduced apixaban.   Bilateral carotid artery disease    CAD (coronary artery disease)    a.) LHC 02/09/2007: EF 60%, LVEDP 25 mmHg, normal cors; b.) cCTA 11/27/2011: Ca score 129.73 --> 1.17 RCA, 16.36 LM, 112.21 LAD, 0 LCx; c.) MPI 03/13/2014 - normal.   Cataract    CKD (chronic kidney disease), stage IV    Diastolic dysfunction 02/09/2008   a.) LHC 02/09/2008: EF 60%, LVEDP 25 mmHg; b.) TTE 08/07/2010: EF 65%, mild LVH, mild MR, G2DD; b.) TTE 08/20/2018: EF 45-50%, inferior HK, mild  LAE; c.) TTE 08/11/2021: EF 45-50%. 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   Left carotid bruit    Long term current use of anticoagulant    a.) apixaban   Lumbar stenosis    Lymphocytic colitis    Osteoporosis    TIA (transient ischemic attack) 07/2018   TIA (transient ischemic attack) 08/2021   Vitamin B12 deficiency    Wears hearing BILATERAL aids    Wears partial (bottom) dentures    Past Surgical History:  Procedure Laterality Date   BREAST EXCISIONAL BIOPSY Right 36 years ago   neg   BUNIONECTOMY Bilateral    COLONOSCOPY N/A 07/02/2008   KNEE ARTHROPLASTY Right 03/11/2022   Procedure: COMPUTER ASSISTED TOTAL KNEE ARTHROPLASTY;  Surgeon: Donato Heinz, MD;  Location: [facility] ORS;  Service: Orthopedics;  Laterality: Right;   KNEE ARTHROSCOPY Right 2006   LEFT HEART CATH AND CORONARY ANGIOGRAPHY Left 02/09/2007   Procedure: LEFT HEART CATH AND CORONARY ANGIOGRAPHY; Location: [facility]   TONSILLECTOMY AND ADENOIDECTOMY Bilateral    TOTAL ABDOMINAL HYSTERECTOMY W/ BILATERAL SALPINGOOPHORECTOMY N/A    TOTAL KNEE ARTHROPLASTY Left 04/26/2014   TUBAL LIGATION     Family History  Adopted: Yes  Problem Relation Age of Onset   Heart disease Sister    Stroke Brother    Social History   Socioeconomic History   Marital status: Widowed    Spouse name: Not on file   Number of children: Not on file   Years of education: Not on file   Highest education level: Not on file  Occupational History   Not on file  Tobacco Use   Smoking status: Never   Smokeless tobacco: Never  Vaping Use   Vaping Use: Never used  Substance and Sexual Activity   Alcohol use: Not Currently   Drug use: Never   Sexual activity: Not Currently  Other Topics Concern   Not on file  Social History Narrative   Not on file    Social Determinants of Health   Financial Resource Strain: Not on file  Food Insecurity: Not on file  Transportation Needs: Not on file  Physical Activity: Not on file  Stress: Not on file  Social Connections: Not on file    Tobacco Counseling Counseling given: Not Answered   Clinical Intake:  Pre-visit preparation completed: Yes  Pain : No/denies pain     BMI - recorded: 29 Nutritional Status: BMI 25 -29 Overweight Diabetes: No  How often do you need to have someone help you when you read instructions, pamphlets, or other written materials from your doctor or pharmacy?: 1 - Never  Diabetic?no  Activities of Daily Living    12/01/2022   11:01 AM 03/11/2022    6:00 PM  In your present state of health, do you have any difficulty performing the following activities:  Hearing? 1   Vision? 0   Difficulty concentrating or making decisions? 0   Walking or climbing stairs? 0   Dressing or bathing? 0   Doing errands, shopping? 0 0  Preparing Food and eating ? N   Using the Toilet? N   In the past six months, have you accidently leaked urine? Y   Do you have problems with loss of bowel control? Y   Managing your Medications? N   Managing your Finances? N   Housekeeping or managing your Housekeeping? N     Patient Care Team: [name], NP as PCP - General (Geriatric Medicine) Debbe Odea, MD as PCP - Cardiology (Cardiology) Domingo Madeira, OD (Optometry)  Indicate any recent Medical Services you may have received from other than [facility] providers in the past year (date may be approximate).     Assessment:   This is a routine wellness examination for [facility].  Hearing/Vision screen Hearing Screening - Comments:: Wears hearing aids  Vision Screening - Comments:: Last eye exam less than 12 months ago, Dr. Larence Penning   Dietary issues and exercise activities discussed: Current Exercise Habits: Home exercise routine;Structured exercise class,  Type of exercise: calisthenics, Time (Minutes): 40, Frequency (Times/Week): 7, Weekly Exercise (Minutes/Week): 280   Goals Addressed   None    Depression Screen    12/01/2022   10:52 AM 09/24/2022    1:14 PM 04/07/2022   10:26 AM 11/25/2021    1:52 PM  PHQ 2/9 Scores  PHQ - 2 Score 0 0 0 0    Fall Risk    12/01/2022   10:52 AM 10/08/2022   10:25 AM 09/24/2022    1:14 PM 04/07/2022   10:26 AM 11/25/2021    1:52 PM  Fall Risk   Falls in the past year? 0 0 0 0 0  Number falls in past yr: 0 0 0 0 0  Injury with Fall? 0 0 0 0 0  Risk for fall due to : No Fall Risks No Fall Risks No Fall Risks No Fall Risks No Fall Risks  Follow up Falls evaluation completed Falls evaluation completed  Falls evaluation completed Falls evaluation completed    FALL RISK PREVENTION PERTAINING TO THE HOME:  Any stairs in or around the home? Yes  If so, are there any without handrails? No  Home free of loose throw rugs in walkways, pet beds, electrical cords, etc? Yes  Adequate lighting in your home to reduce risk of falls? Yes   ASSISTIVE DEVICES UTILIZED TO PREVENT FALLS:  Life alert? Yes  Use of a cane, walker or w/c? No  Grab bars in the bathroom? Yes  Shower chair or bench in shower? Yes  Elevated toilet seat or a handicapped toilet? Yes   TIMED UP AND GO:  Was the test performed? No .    Cognitive Function:        12/01/2022   10:54 AM 11/25/2021    1:55 PM  6CIT Screen  What Year? 0 points 0 points  What month? 0 points 0 points  What time? 0 points 0 points  Count back from 20 0 points 0 points  Months in reverse 2 points 0 points  Repeat phrase 0 points 0 points  Total Score 2 points 0 points  Immunizations Immunization History  Administered Date(s) Administered   COVID-19, mRNA, vaccine(Comirnaty)12 years and older 07/03/2022   DTaP 05/20/2017   Fluad Quad(high Dose 65+) 05/20/2017   Influenza Inj Mdck Quad Pf 05/31/2018, 05/31/2019   Influenza, High Dose Seasonal PF  07/28/2010, 06/24/2011, 05/29/2013, 07/31/2014   Influenza-Unspecified 07/04/2007, 05/20/2017, 06/01/2020, 06/09/2022   Moderna Covid-19 Vaccine Bivalent Booster 6yrs & up 01/20/2022   Moderna Sars-Covid-2 Vaccination 09/05/2019, 10/03/2019, 07/09/2020, 01/09/2021   Pfizer Covid-19 Vaccine Bivalent Booster 22yrs & up 05/15/2021   Pneumococcal Conjugate-13 08/20/2015   Pneumococcal Polysaccharide-23 07/28/2010, 07/28/2010   Tdap 01/22/2017, 05/20/2017    TDAP status: Up to date  Flu Vaccine status: Up to date  Pneumococcal vaccine status: Up to date  Covid-19 vaccine status: Information provided on how to obtain vaccines.   Qualifies for Shingles Vaccine? No   Zostavax completed Yes   Shingrix Completed?: No.    Education has been provided regarding the importance of this vaccine. Patient has been advised to call insurance company to determine out of pocket expense if they have not yet received this vaccine. Advised may also receive vaccine at local pharmacy or Health Dept. Verbalized acceptance and understanding.  Screening Tests Health Maintenance  Topic Date Due   Zoster Vaccines- Shingrix (1 of 2) Never done   COVID-19 Vaccine (8 - 2023-24 season) 08/28/2022   INFLUENZA VACCINE  03/25/2023   Medicare Annual Wellness (AWV)  12/01/2023   DTaP/Tdap/Td (4 - Td or Tdap) 05/21/2027   Pneumonia Vaccine 38+ Years old  Completed   DEXA SCAN  Completed   HPV VACCINES  Aged Out    Health Maintenance  Health Maintenance Due  Topic Date Due   Zoster Vaccines- Shingrix (1 of 2) Never done   COVID-19 Vaccine (8 - 2023-24 season) 08/28/2022    Colorectal cancer screening: No longer required.   Mammogram status: No longer required due to no.  Bone Density status: Completed 02/02/22. Results reflect: Bone density results: OSTEOPOROSIS. Repeat every 2 years.  Lung Cancer Screening: (Low Dose CT Chest recommended if Age 16-80 years, 30 pack-year currently smoking OR have quit w/in  15years.) does not qualify.   Lung Cancer Screening Referral: na  Additional Screening:  Hepatitis C Screening: does not qualify  Vision Screening: Recommended annual ophthalmology exams for early detection of glaucoma and other disorders of the eye. Is the patient up to date with their annual eye exam?  yes Who is the provider or what is the name of the office in which the patient attends annual eye exams? nice If pt is not established with a provider, would they like to be referred to a provider to establish care? No .   Dental Screening: Recommended annual dental exams for proper oral hygiene  Community Resource Referral / Chronic Care Management: CRR required this visit?  No   CCM required this visit?  No      Plan:     I have personally reviewed and noted the following in the patient's chart:   Medical and social history Use of alcohol, tobacco or illicit drugs  Current medications and supplements including opioid prescriptions. Patient is not currently taking opioid prescriptions. Functional ability and status Nutritional status Physical activity Advanced directives List of other physicians Hospitalizations, surgeries, and ER visits in previous 12 months Vitals Screenings to include cognitive, depression, and falls Referrals and appointments  In addition, I have reviewed and discussed with patient certain preventive protocols, quality metrics, and best practice recommendations. A written personalized care  plan for preventive services as well as general preventive health recommendations were provided to patient.     [name], NP   12/01/2022   Virtual Visit via Video Note  I connected with Brittney [name] on 12/01/22 at 11:00 AM EDT by a video enabled telemedicine application and verified that I am speaking with the correct person using two identifiers.  Location: Patient: home Provider: [facility]    I discussed the limitations of evaluation and  management by telemedicine and the availability of in person appointments. The patient expressed understanding and agreed to proceed.    I discussed the assessment and treatment plan with the patient. The patient was provided an opportunity to ask questions and all were answered. The patient agreed with the plan and demonstrated an understanding of the instructions.   The patient was advised to call back or seek an in-person evaluation if the symptoms worsen or if the condition fails to improve as anticipated.  I provided 15 minutes of non-face-to-face time during this encounter.  Janene Harvey. Janyth Contes, AGNP Avs printed and mailed.

## 2022-12-08 ENCOUNTER — Other Ambulatory Visit: Payer: Self-pay | Admitting: Nurse Practitioner

## 2022-12-28 ENCOUNTER — Other Ambulatory Visit: Payer: Self-pay | Admitting: Nurse Practitioner

## 2023-01-13 ENCOUNTER — Ambulatory Visit: Payer: Medicare Other | Admitting: Student

## 2023-01-13 ENCOUNTER — Encounter: Payer: Self-pay | Admitting: Student

## 2023-01-13 VITALS — BP 136/84 | HR 86 | Temp 98.1°F | Ht 59.0 in | Wt 153.0 lb

## 2023-01-13 DIAGNOSIS — R0981 Nasal congestion: Secondary | ICD-10-CM

## 2023-01-13 DIAGNOSIS — J040 Acute laryngitis: Secondary | ICD-10-CM

## 2023-01-13 MED ORDER — CEPACOL 15-2.3 MG MT LOZG: 1.0000 | LOZENGE | Freq: Three times a day (TID) | OROMUCOSAL | 0 refills | Status: DC | PRN

## 2023-01-13 MED ORDER — GUAIFENESIN ER 600 MG PO TB12: 600.0000 mg | ORAL_TABLET | Freq: Two times a day (BID) | ORAL | 0 refills | Status: DC

## 2023-01-13 MED ORDER — FLUTICASONE PROPIONATE 50 MCG/ACT NA SUSP: 2.0000 | NASAL | 1 refills | Status: DC | PRN

## 2023-01-13 NOTE — Progress Notes (Signed)
[facility].   Provider: Dr. Earnestine Mealing  Code Status: DNR Goals of Care:     01/13/2023    3:00 PM  Advanced Directives  Does Patient Have a Medical Advance Directive? Yes  Type of Estate agent of [place];Out of facility DNR (pink MOST or yellow form);Living will  Does patient want to make changes to medical advance directive? No - Patient declined  Copy of Healthcare Power of Attorney in Chart? No - copy requested     Chief Complaint  Patient presents with   Acute Visit    Complains of Cough with Greenish/yellowish congestion. Nasal Drainage. Took Covid Test on Friday and Today and Both were NEGATIVE.     HPI: Patient is a 89 y.o. female seen today for an acute visit for Cough with Congestion. Negative Covid testing on Friday and Today.   Symptoms started on Friday. She took two COVID tests. She is most concerned about pneumonia. She hasn't taken any   She has cough, and laryngitis. She can't talk to her dog.   Denies headaches.   She doesn't want antibiotics either.   She has a [place]  She just started BP medication and she is wondering if that is impacted her stomach.  She has good bowel movements with intermittent diarrhea.   She eats from the [place] often. Usually about 50% of the time.   Past Medical History:  Diagnosis Date   Adopted person    Allergy    Anemia    Angina pectoris (HCC)    Aortic atherosclerosis (HCC)    Arthritis    Atrial fibrillation (HCC)    a. ) CHA2DS2-VASc = 7 (age x 2, sex, HTN, TIA x2, aortic plaque);  b.) rate/rhythm maintained on oral metoprolol succinate; chronically anticoagulated using dose reduced apixaban.   Bilateral carotid artery disease (HCC)    CAD (coronary artery disease)    a.) LHC 02/09/2007: EF 60%, LVEDP 25 mmHg, normal cors; b.) cCTA 11/27/2011: Ca score 129.73 --> 1.17 RCA, 16.36 LM, 112.21 LAD, 0 LCx; c.) MPI 03/13/2014 - normal.   Cataract    CKD (chronic kidney  disease), stage IV (HCC)    Diastolic dysfunction 02/09/2008   a.) LHC 02/09/2008: EF 60%, LVEDP 25 mmHg; b.) TTE 08/07/2010: EF 65%, mild LVH, mild MR, G2DD; b.) TTE 08/20/2018: EF 45-50%, inferior HK, mild LAE; c.) TTE 08/11/2021: EF 45-50%. 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   Left carotid bruit    Long term current use of anticoagulant    a.) apixaban   Lumbar stenosis    Lymphocytic colitis    Osteoporosis    TIA (transient ischemic attack) 07/2018   TIA (transient ischemic attack) 08/2021   Vitamin B12 deficiency    Wears hearing BILATERAL aids    Wears partial (bottom) dentures     Past Surgical History:  Procedure Laterality Date   BREAST EXCISIONAL BIOPSY Right 36 years ago   neg   BUNIONECTOMY Bilateral    COLONOSCOPY N/A 07/02/2008   KNEE ARTHROPLASTY Right 03/11/2022   Procedure: COMPUTER ASSISTED TOTAL KNEE ARTHROPLASTY;  Surgeon: Donato Heinz, MD;  Location: [facility] ORS;  Service: Orthopedics;  Laterality: Right;   KNEE ARTHROSCOPY Right 2006   LEFT HEART CATH AND CORONARY ANGIOGRAPHY  Left 02/09/2007   Procedure: LEFT HEART CATH AND CORONARY ANGIOGRAPHY; Location: [facility]   TONSILLECTOMY AND ADENOIDECTOMY Bilateral    TOTAL ABDOMINAL HYSTERECTOMY W/ BILATERAL SALPINGOOPHORECTOMY N/A    TOTAL KNEE ARTHROPLASTY Left 04/26/2014   TUBAL LIGATION      Allergies  Allergen Reactions   Lisinopril Swelling   Mixed Grasses Anaphylaxis   Bee Venom Swelling   Azithromycin Diarrhea   Baclofen Diarrhea   Iodine     CKD   Morphine And Codeine Nausea And Vomiting   Northern Quahog Clam (M. Mercenaria) Skin Test Diarrhea    Mussels.   Sulfa Antibiotics Rash    Outpatient Encounter Medications as of 01/13/2023  Medication Sig   acetaminophen (TYLENOL) 500 MG tablet Take 1,000 mg by mouth  2 (two) times daily as needed.   Cholecalciferol (VITAMIN D) 50 MCG (2000 UT) tablet Take 2,000 Units by mouth daily.   Cyanocobalamin (B-12) 1000 MCG TABS TAKE ONE TABLET BY MOUTH EVERY DAY   EPINEPHrine 0.3 mg/0.3 mL IJ SOAJ injection Inject 0.3 mg into the muscle as needed for anaphylaxis.   fluticasone (FLONASE) 50 MCG/ACT nasal spray Place 2 sprays into the nose as needed.   loratadine (CLARITIN) 10 MG tablet Take 10 mg by mouth daily.   metoprolol succinate (TOPROL-XL) 25 MG 24 hr tablet TAKE 1 TABLET DAILY   Olopatadine HCl (PAZEO) 0.7 % SOLN Place 1 drop into both eyes daily as needed.   Rivaroxaban (XARELTO) 15 MG TABS tablet Take 1 tablet (15 mg total) by mouth daily with supper.   triamcinolone cream (KENALOG) 0.1 % Apply 1 Application topically 2 (two) times daily as needed.   No facility-administered encounter medications on file as of 01/13/2023.    Review of Systems:  Review of Systems  Health Maintenance  Topic Date Due   Zoster Vaccines- Shingrix (1 of 2) Never done   COVID-19 Vaccine (9 - 2023-24 season) 01/26/2023   INFLUENZA VACCINE  03/25/2023   Medicare Annual Wellness (AWV)  12/01/2023   DTaP/Tdap/Td (4 - Td or Tdap) 05/21/2027   Pneumonia Vaccine 21+ Years old  Completed   DEXA SCAN  Completed   HPV VACCINES  Aged Out    Physical Exam: Vitals:   01/13/23 1455  BP: 136/84  Pulse: 86  Temp: 98.1 F (36.7 C)  SpO2: 96%  Weight: 153 lb (69.4 kg)  Height: 4\' 11"  (1.499 m)   Body mass index is 30.9 kg/m. Physical Exam Vitals reviewed.  Constitutional:      Appearance: Normal appearance.  HENT:     Head: Normocephalic.     Right Ear: Tympanic membrane normal.     Left Ear: Tympanic membrane normal.     Nose: Nose normal.     Mouth/Throat:     Mouth: Mucous membranes are moist.     Pharynx: No oropharyngeal exudate or posterior oropharyngeal erythema.  Eyes:     Extraocular Movements: Extraocular movements intact.     Pupils: Pupils are equal,  round, and reactive to light.  Cardiovascular:     Rate and Rhythm: Normal rate. Rhythm irregular.  Pulmonary:     Effort: Pulmonary effort is normal.     Breath sounds: Normal breath sounds.  Neurological:     Mental Status: She is alert.     Labs reviewed: Basic Metabolic Panel: Recent Labs    02/27/22 1257 09/04/22 1129  NA 140 139  K 4.2 4.8  CL 110 108  CO2 23 24  GLUCOSE 98  99  BUN 34* 28*  CREATININE 1.47* 1.35*  CALCIUM 9.4 9.1   Liver Function Tests: Recent Labs    02/27/22 1257 09/04/22 1129  AST 17 18  ALT 10 9  ALKPHOS 49 61  BILITOT 1.2 1.2  PROT 6.4* 6.5  ALBUMIN 3.9 4.0   No results for input(s): "LIPASE", "AMYLASE" in the last 8760 hours. No results for input(s): "AMMONIA" in the last 8760 hours. CBC: Recent Labs    02/27/22 1257 09/04/22 1129  WBC 6.8 6.5  HGB 11.9* 10.9*  HCT 37.3 35.5*  MCV 98.4 97.8  PLT 360 390   Lipid Panel: Recent Labs    09/04/22 1129  CHOL 148  HDL 64  LDLCALC 63  TRIG 104  CHOLHDL 2.3  LDLDIRECT 58   Lab Results  Component Value Date   HGBA1C 5.4 08/20/2018    Procedures since last visit: No results found.  Assessment/Plan Nasal congestion - Plan: fluticasone (FLONASE) 50 MCG/ACT nasal spray, guaiFENesin (MUCINEX) 600 MG 12 hr tablet  Laryngitis - Plan: Benzocaine-Menthol (CEPACOL) 15-2.3 MG LOZG F/u in 1 week if no improvement on recommended regimen. Continue fluticasone and claritin.    Labs/tests ordered:  * No order type specified * Next appt:  04/08/2023

## 2023-01-13 NOTE — Patient Instructions (Addendum)
If you are not feeling better by Jan 22, 2023, please let me know!

## 2023-03-02 ENCOUNTER — Other Ambulatory Visit: Payer: Self-pay | Admitting: Nurse Practitioner

## 2023-03-25 ENCOUNTER — Ambulatory Visit: Payer: Medicare Other | Admitting: Nurse Practitioner

## 2023-03-25 ENCOUNTER — Encounter: Payer: Self-pay | Admitting: Nurse Practitioner

## 2023-03-25 VITALS — BP 110/62 | HR 60 | Resp 20

## 2023-03-25 DIAGNOSIS — M545 Low back pain, unspecified: Secondary | ICD-10-CM | POA: Diagnosis not present

## 2023-03-25 DIAGNOSIS — R151 Fecal smearing: Secondary | ICD-10-CM

## 2023-03-25 DIAGNOSIS — G8929 Other chronic pain: Secondary | ICD-10-CM | POA: Diagnosis not present

## 2023-03-25 NOTE — Patient Instructions (Signed)
Okay to start probiotic daily  Increase fiber- can use metamucil or benefiber 1-2 times daily Make sure you are drinking enough fluids with the fiber.

## 2023-03-25 NOTE — Progress Notes (Signed)
Careteam: Patient Care Team: [name], NP as PCP - General (Geriatric Medicine) Debbe Odea, MD as PCP - Cardiology (Cardiology) Domingo Madeira, OD [facility])  Advanced Directive information Does Patient Have a Medical Advance Directive?: Yes, Type of Advance Directive: Healthcare Power of [place];Out of facility DNR (pink MOST or yellow form), Does patient want to make changes to medical advance directive?: No - Patient declined  Allergies  Allergen Reactions   Lisinopril Swelling   Mixed Grasses Anaphylaxis   Bee Venom Swelling   Azithromycin Diarrhea   Baclofen Diarrhea   Iodine     CKD   Morphine And Codeine Nausea And Vomiting   [place] Clam (M. Mercenaria) Skin Test Diarrhea    Mussels.   Sulfa Antibiotics Rash    Chief Complaint  Patient presents with   Acute Visit    Complains of Rectal Leakage for a few months. Requesting referral to GI     HPI: Patient is a 89 y.o. female seen in today at the [facility] for bowel and urinary problems.  She generally does not bring it up.  She wanted a GI referral.  Leaking bowel.  She will have severe diarrhea occasionally (once weekly at the most) Abdominal pain when she has diarrhea No fever or chills.  Hx of inflammatory colitis.   Reports her lower back has been causing her a lot of pain. Can hardly get out of bed in the morning without having extreme pain.  [facility] due to her pain.  She started having pain after her husband died, prior to his death she was moving him around a lot and then it started hurting after that.   She had MRI in the past- injections were recommended but she never went through with it, now pain is much worse.  Reports pain does not radiate down her legs.  Her muscles do get tight.  She walks her dog 4 times a day.  She has done PT in the past but it did not help.   Review of Systems:  Review of Systems  Constitutional:   Negative for chills, fever and weight loss.  HENT:  Negative for tinnitus.   Respiratory:  Negative for cough, sputum production and shortness of breath.   Cardiovascular:  Negative for chest pain, palpitations and leg swelling.  Gastrointestinal:  Positive for abdominal pain and diarrhea. Negative for constipation and heartburn.  Genitourinary:  Negative for dysuria, frequency and urgency.  Musculoskeletal:  Positive for back pain and joint pain. Negative for falls and myalgias.  Skin: Negative.   Neurological:  Negative for dizziness and headaches.  Psychiatric/Behavioral:  Negative for depression and memory loss. The patient does not have insomnia.     Past Medical History:  Diagnosis Date   Adopted person    Allergy    Anemia    Angina pectoris (HCC)    Aortic atherosclerosis (HCC)    Arthritis    Atrial fibrillation (HCC)    a. ) CHA2DS2-VASc = 7 (age x 2, sex, HTN, TIA x2, aortic plaque);  b.) rate/rhythm maintained on oral metoprolol succinate; chronically anticoagulated using dose reduced apixaban.   Bilateral carotid artery disease (HCC)    CAD (coronary artery disease)    a.) LHC 02/09/2007: EF 60%, LVEDP 25 mmHg, normal cors; b.) cCTA 11/27/2011: Ca score 129.73 --> 1.17 RCA, 16.36 LM, 112.21 LAD, 0 LCx; c.) MPI 03/13/2014 - normal.   Cataract  CKD (chronic kidney disease), stage IV (HCC)    Diastolic dysfunction 02/09/2008   a.) LHC 02/09/2008: EF 60%, LVEDP 25 mmHg; b.) TTE 08/07/2010: EF 65%, mild LVH, mild MR, G2DD; b.) TTE 08/20/2018: EF 45-50%, inferior HK, mild LAE; c.) TTE 08/11/2021: EF 45-50%. 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   Left carotid bruit    Long term current use of anticoagulant    a.) apixaban   Lumbar stenosis    Lymphocytic colitis    Osteoporosis    TIA (transient  ischemic attack) 07/2018   TIA (transient ischemic attack) 08/2021   Vitamin B12 deficiency    Wears hearing BILATERAL aids    Wears partial (bottom) dentures    Past Surgical History:  Procedure Laterality Date   BREAST EXCISIONAL BIOPSY Right 36 years ago   neg   BUNIONECTOMY Bilateral    COLONOSCOPY N/A 07/02/2008   KNEE ARTHROPLASTY Right 03/11/2022   Procedure: COMPUTER ASSISTED TOTAL KNEE ARTHROPLASTY;  Surgeon: Donato Heinz, MD;  Location: [facility] ORS;  Service: Orthopedics;  Laterality: Right;   KNEE ARTHROSCOPY Right 2006   LEFT HEART CATH AND CORONARY ANGIOGRAPHY Left 02/09/2007   Procedure: LEFT HEART CATH AND CORONARY ANGIOGRAPHY; Location: [facility]   TONSILLECTOMY AND ADENOIDECTOMY Bilateral    TOTAL ABDOMINAL HYSTERECTOMY W/ BILATERAL SALPINGOOPHORECTOMY N/A    TOTAL KNEE ARTHROPLASTY Left 04/26/2014   TUBAL LIGATION     Social History:   reports that she has never smoked. She has never used smokeless tobacco. She reports that she does not currently use alcohol. She reports that she does not use drugs.  Family History  Adopted: Yes  Problem Relation Age of Onset   Heart disease Sister    Stroke Brother     Medications: Patient's Medications  New Prescriptions   No medications on file  Previous Medications   ACETAMINOPHEN (TYLENOL) 500 MG TABLET    Take 1,000 mg by mouth 2 (two) times daily as needed.   BENZOCAINE-MENTHOL (CEPACOL) 15-2.3 MG LOZG    Use as directed 1 lozenge in the mouth or throat 3 (three) times daily as needed.   CHOLECALCIFEROL (VITAMIN D) 50 MCG (2000 UT) TABLET    Take 2,000 Units by mouth daily.   CYANOCOBALAMIN (B-12) 1000 MCG TABS    TAKE ONE TABLET BY MOUTH EVERY DAY   EPINEPHRINE 0.3 MG/0.3 ML IJ SOAJ INJECTION    Inject 0.3 mg into the muscle as needed for anaphylaxis.   FLUTICASONE (FLONASE) 50 MCG/ACT NASAL SPRAY    Place 2 sprays into both nostrils as needed.   GUAIFENESIN (MUCINEX) 600 MG 12 HR TABLET    Take 1 tablet (600  mg total) by mouth 2 (two) times daily.   LORATADINE (CLARITIN) 10 MG TABLET    Take 10 mg by mouth daily.   METOPROLOL SUCCINATE (TOPROL-XL) 25 MG 24 HR TABLET    TAKE 1 TABLET DAILY   OLOPATADINE HCL (PAZEO) 0.7 % SOLN    Place 1 drop into both eyes daily as needed.   RIVAROXABAN (XARELTO) 15 MG TABS TABLET    Take 1 tablet (15 mg total) by mouth daily with supper.   TRIAMCINOLONE CREAM (KENALOG) 0.1 %    Apply 1 Application topically 2 (two) times daily as needed.  Modified Medications  No medications on file  Discontinued Medications   No medications on file    Physical Exam:  Vitals:   03/25/23 1055  BP: 110/62  Pulse: 60  Resp: 20  SpO2: 99%   There is no height or weight on file to calculate BMI. Wt Readings from Last 3 Encounters:  01/13/23 153 lb (69.4 kg)  10/08/22 144 lb 8 oz (65.5 kg)  09/04/22 152 lb 12.8 oz (69.3 kg)    Physical Exam Constitutional:      General: She is not in acute distress.    Appearance: She is well-developed. She is not diaphoretic.  HENT:     Head: Normocephalic and atraumatic.     Mouth/Throat:     Pharynx: No oropharyngeal exudate.  Eyes:     Conjunctiva/sclera: Conjunctivae normal.     Pupils: Pupils are equal, round, and reactive to light.  Cardiovascular:     Rate and Rhythm: Normal rate and regular rhythm.     Heart sounds: Normal heart sounds.  Pulmonary:     Effort: Pulmonary effort is normal.     Breath sounds: Normal breath sounds.  Abdominal:     General: Bowel sounds are normal.     Palpations: Abdomen is soft.  Musculoskeletal:     Cervical back: Normal range of motion and neck supple.     Right lower leg: No edema.     Left lower leg: No edema.  Skin:    General: Skin is warm and dry.  Neurological:     Mental Status: She is alert.  Psychiatric:        Mood and Affect: Mood normal.     Labs reviewed: Basic Metabolic Panel: Recent Labs    09/04/22 1129  NA 139  K 4.8  CL 108  CO2 24  GLUCOSE 99   BUN 28*  CREATININE 1.35*  CALCIUM 9.1   Liver Function Tests: Recent Labs    09/04/22 1129  AST 18  ALT 9  ALKPHOS 61  BILITOT 1.2  PROT 6.5  ALBUMIN 4.0   No results for input(s): "LIPASE", "AMYLASE" in the last 8760 hours. No results for input(s): "AMMONIA" in the last 8760 hours. CBC: Recent Labs    09/04/22 1129  WBC 6.5  HGB 10.9*  HCT 35.5*  MCV 97.8  PLT 390   Lipid Panel: Recent Labs    09/04/22 1129  CHOL 148  HDL 64  LDLCALC 63  TRIG 104  CHOLHDL 2.3  LDLDIRECT 58   TSH: No results for input(s): "TSH" in the last 8760 hours. A1C: Lab Results  Component Value Date   HGBA1C 5.4 08/20/2018     Assessment/Plan 1. Chronic right-sided low back pain without sciatica -ongoing, has been seen by specialist in the past and then did not follow up but has been getting worse.  PT was not beneficial for her in the past - Ambulatory referral to Physical Medicine Rehab would like to see Dr Yves Dill for injections, he saw her husband when he was alive.   2. Fecal smearing Will have her increase fiber in diet - to use benefiber or metamucil daily at this time -to start probiotic daily  - Ambulatory referral to Gastroenterology   Next appt: 04/08/2023  Shanda Bumps K. Biagio Borg  [facility] [phone]

## 2023-04-08 ENCOUNTER — Ambulatory Visit (SKILLED_NURSING_FACILITY): Payer: Medicare Other | Admitting: Nurse Practitioner

## 2023-04-08 ENCOUNTER — Ambulatory Visit
Admission: RE | Admit: 2023-04-08 | Discharge: 2023-04-08 | Disposition: A | Discharge status: Home / Self Care | Payer: Medicare Other | Attending: Nurse Practitioner | Admitting: Nurse Practitioner

## 2023-04-08 ENCOUNTER — Ambulatory Visit
Admission: RE | Admit: 2023-04-08 | Discharge: 2023-04-08 | Disposition: A | Discharge status: Home / Self Care | Payer: Medicare Other | Source: Ambulatory Visit | Attending: Nurse Practitioner | Admitting: Nurse Practitioner

## 2023-04-08 ENCOUNTER — Encounter: Payer: Self-pay | Admitting: Nurse Practitioner

## 2023-04-08 VITALS — BP 128/80 | HR 72 | Temp 98.2°F | Ht 59.0 in | Wt 156.0 lb

## 2023-04-08 DIAGNOSIS — R062 Wheezing: Secondary | ICD-10-CM | POA: Diagnosis present

## 2023-04-08 DIAGNOSIS — I4821 Permanent atrial fibrillation: Secondary | ICD-10-CM

## 2023-04-08 DIAGNOSIS — R151 Fecal smearing: Secondary | ICD-10-CM

## 2023-04-08 DIAGNOSIS — N1831 Chronic kidney disease, stage 3a: Secondary | ICD-10-CM

## 2023-04-08 DIAGNOSIS — M545 Low back pain, unspecified: Secondary | ICD-10-CM

## 2023-04-08 DIAGNOSIS — R198 Other specified symptoms and signs involving the digestive system and abdomen: Secondary | ICD-10-CM

## 2023-04-08 DIAGNOSIS — E538 Deficiency of other specified B group vitamins: Secondary | ICD-10-CM

## 2023-04-08 DIAGNOSIS — D6869 Other thrombophilia: Secondary | ICD-10-CM

## 2023-04-08 DIAGNOSIS — D649 Anemia, unspecified: Secondary | ICD-10-CM

## 2023-04-08 DIAGNOSIS — I25118 Atherosclerotic heart disease of native coronary artery with other forms of angina pectoris: Secondary | ICD-10-CM

## 2023-04-08 DIAGNOSIS — G8929 Other chronic pain: Secondary | ICD-10-CM

## 2023-04-08 DIAGNOSIS — R0602 Shortness of breath: Secondary | ICD-10-CM

## 2023-04-08 NOTE — Patient Instructions (Signed)
To come to [facility] at 7:30 am for blood work.

## 2023-04-08 NOTE — Progress Notes (Signed)
c   Careteam: Patient Care Team: [name], NP as PCP - General (Geriatric Medicine) Debbe Odea, MD as PCP - Cardiology (Cardiology) Domingo Madeira, OD [facility])  Advanced Directive information Does Patient Have a Medical Advance Directive?: Yes, Type of Advance Directive: Healthcare Power of [place];Out of facility DNR (pink MOST or yellow form), Pre-existing out of facility DNR order (yellow form or pink MOST form): Yellow form placed in chart (order not valid for inpatient use), Does patient want to make changes to medical advance directive?: No - Patient declined  Allergies  Allergen Reactions   Lisinopril Swelling   Mixed Grasses Anaphylaxis   Bee Venom Swelling   Azithromycin Diarrhea   Baclofen Diarrhea   Iodine     CKD   Morphine And Codeine Nausea And Vomiting   [place] Clam (M. Mercenaria) Skin Test Diarrhea    Mussels.   Sulfa Antibiotics Rash    Chief Complaint  Patient presents with   Medical Management of Chronic Issues    6 month follow-up. Discuss need for covid booster, shingrix, and flu vaccine.      HPI: Patient is a 89 y.o. female seen in today at the [facility] for routine follow up  She saw GI and reports needs to bulk up. This is helping so far for her fecal smearing.  No dark stools.   Reports she has some intermediate wheezing first thing in the morning and then in the evening. Not during the day. Looked up metoprolol and feels like this could be contributing.   Having an increase in fatigue. Will be exhausted after a short walk.  Also will have more shortness of breath.  No signs of blood loss.  Reports she is having some chest pains when she gets out of breath, increase in activity and reports this is pretty consistent. Not severe pain but feels heavy. No chest pains at this time, Also reports wheezing when she gets out of breath.   At the end of the day ankles are swollen a little.   Review of Systems:   Review of Systems  Constitutional:  Negative for chills, fever and weight loss.  HENT:  Negative for tinnitus.   Respiratory:  Positive for shortness of breath. Negative for cough and sputum production.   Cardiovascular:  Positive for leg swelling. Negative for chest pain and palpitations.  Gastrointestinal:  Negative for abdominal pain, constipation, diarrhea and heartburn.  Genitourinary:  Negative for dysuria, frequency and urgency.  Musculoskeletal:  Positive for back pain. Negative for falls, joint pain and myalgias.  Skin: Negative.   Neurological:  Negative for dizziness and headaches.  Psychiatric/Behavioral:  Negative for depression and memory loss. The patient does not have insomnia.     Past Medical History:  Diagnosis Date   Adopted person    Allergy    Anemia    Angina pectoris (HCC)    Aortic atherosclerosis (HCC)    Arthritis    Atrial fibrillation (HCC)    a. ) CHA2DS2-VASc = 7 (age x 2, sex, HTN, TIA x2, aortic plaque);  b.) rate/rhythm maintained on oral metoprolol succinate; chronically anticoagulated using dose reduced apixaban.   Bilateral carotid artery disease (HCC)    CAD (coronary artery disease)    a.) LHC 02/09/2007: EF 60%, LVEDP 25 mmHg, normal cors; b.) cCTA 11/27/2011: Ca score 129.73 --> 1.17 RCA, 16.36 LM, 112.21 LAD, 0 LCx; c.) MPI 03/13/2014 - normal.   Cataract    CKD (chronic kidney disease),  stage IV (HCC)    Diastolic dysfunction 02/09/2008   a.) LHC 02/09/2008: EF 60%, LVEDP 25 mmHg; b.) TTE 08/07/2010: EF 65%, mild LVH, mild MR, G2DD; b.) TTE 08/20/2018: EF 45-50%, inferior HK, mild LAE; c.) TTE 08/11/2021: EF 45-50%. 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   Left carotid bruit    Long term current use of anticoagulant    a.) apixaban   Lumbar stenosis    Lymphocytic  colitis    Osteoporosis    TIA (transient ischemic attack) 07/2018   TIA (transient ischemic attack) 08/2021   Vitamin B12 deficiency    Wears hearing BILATERAL aids    Wears partial (bottom) dentures    Past Surgical History:  Procedure Laterality Date   BREAST EXCISIONAL BIOPSY Right 36 years ago   neg   BUNIONECTOMY Bilateral    COLONOSCOPY N/A 07/02/2008   KNEE ARTHROPLASTY Right 03/11/2022   Procedure: COMPUTER ASSISTED TOTAL KNEE ARTHROPLASTY;  Surgeon: Donato Heinz, MD;  Location: [facility] ORS;  Service: Orthopedics;  Laterality: Right;   KNEE ARTHROSCOPY Right 2006   LEFT HEART CATH AND CORONARY ANGIOGRAPHY Left 02/09/2007   Procedure: LEFT HEART CATH AND CORONARY ANGIOGRAPHY; Location: [facility]   TONSILLECTOMY AND ADENOIDECTOMY Bilateral    TOTAL ABDOMINAL HYSTERECTOMY W/ BILATERAL SALPINGOOPHORECTOMY N/A    TOTAL KNEE ARTHROPLASTY Left 04/26/2014   TUBAL LIGATION     Social History:   reports that she has never smoked. She has never used smokeless tobacco. She reports that she does not currently use alcohol. She reports that she does not use drugs.  Family History  Adopted: Yes  Problem Relation Age of Onset   Heart disease Sister    Stroke Brother     Medications: Patient's Medications  New Prescriptions   No medications on file  Previous Medications   ACETAMINOPHEN (TYLENOL) 500 MG TABLET    Take 1,000 mg by mouth 2 (two) times daily as needed.   BENZOCAINE-MENTHOL (CEPACOL) 15-2.3 MG LOZG    Use as directed 1 lozenge in the mouth or throat 3 (three) times daily as needed.   CHOLECALCIFEROL (VITAMIN D) 50 MCG (2000 UT) TABLET    Take 2,000 Units by mouth daily.   CYANOCOBALAMIN (B-12) 1000 MCG TABS    TAKE ONE TABLET BY MOUTH EVERY DAY   EPINEPHRINE 0.3 MG/0.3 ML IJ SOAJ INJECTION    Inject 0.3 mg into the muscle as needed for anaphylaxis.   FLUTICASONE (FLONASE) 50 MCG/ACT NASAL SPRAY    Place 2 sprays into both nostrils as needed.   GUAIFENESIN  (MUCINEX) 600 MG 12 HR TABLET    Take 1 tablet (600 mg total) by mouth 2 (two) times daily.   LORATADINE (CLARITIN) 10 MG TABLET    Take 10 mg by mouth daily.   METOPROLOL SUCCINATE (TOPROL-XL) 25 MG 24 HR TABLET    TAKE 1 TABLET DAILY   OLOPATADINE HCL (PAZEO) 0.7 % SOLN    Place 1 drop into both eyes daily as needed.   RIVAROXABAN (XARELTO) 15 MG TABS TABLET    Take 1 tablet (15 mg total) by mouth daily with supper.   TRIAMCINOLONE CREAM (KENALOG) 0.1 %    Apply 1 Application topically 2 (two) times daily as needed.  Modified Medications   No medications  on file  Discontinued Medications   No medications on file    Physical Exam:  Vitals:   04/08/23 0852  BP: 128/80  Pulse: 72  Temp: 98.2 F (36.8 C)  TempSrc: Temporal  SpO2: 97%  Weight: 156 lb (70.8 kg)  Height: 4\' 11"  (1.499 m)   Body mass index is 30.9 kg/m. Wt Readings from Last 3 Encounters:  01/13/23 153 lb (69.4 kg)  10/08/22 144 lb 8 oz (65.5 kg)  09/04/22 152 lb 12.8 oz (69.3 kg)    Physical Exam Constitutional:      General: She is not in acute distress.    Appearance: She is well-developed. She is not diaphoretic.  HENT:     Head: Normocephalic and atraumatic.     Mouth/Throat:     Pharynx: No oropharyngeal exudate.  Eyes:     Conjunctiva/sclera: Conjunctivae normal.     Pupils: Pupils are equal, round, and reactive to light.  Cardiovascular:     Rate and Rhythm: Normal rate and regular rhythm.     Heart sounds: Normal heart sounds.  Pulmonary:     Effort: Pulmonary effort is normal.     Breath sounds: Normal breath sounds.  Abdominal:     General: Bowel sounds are normal.     Palpations: Abdomen is soft.  Musculoskeletal:     Cervical back: Normal range of motion and neck supple.     Right lower leg: No edema.     Left lower leg: No edema.  Skin:    General: Skin is warm and dry.  Neurological:     Mental Status: She is alert and oriented to person, place, and time.  Psychiatric:         Mood and Affect: Mood normal.    Labs reviewed: Basic Metabolic Panel: Recent Labs    09/04/22 1129  NA 139  K 4.8  CL 108  CO2 24  GLUCOSE 99  BUN 28*  CREATININE 1.35*  CALCIUM 9.1   Liver Function Tests: Recent Labs    09/04/22 1129  AST 18  ALT 9  ALKPHOS 61  BILITOT 1.2  PROT 6.5  ALBUMIN 4.0   No results for input(s): "LIPASE", "AMYLASE" in the last 8760 hours. No results for input(s): "AMMONIA" in the last 8760 hours. CBC: Recent Labs    09/04/22 1129  WBC 6.5  HGB 10.9*  HCT 35.5*  MCV 97.8  PLT 390   Lipid Panel: Recent Labs    09/04/22 1129  CHOL 148  HDL 64  LDLCALC 63  TRIG 104  CHOLHDL 2.3  LDLDIRECT 58   TSH: No results for input(s): "TSH" in the last 8760 hours. A1C: Lab Results  Component Value Date   HGBA1C 5.4 08/20/2018     Assessment/Plan 1. 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   2. Alternating constipation and diarrhea -stable, continue on fiber supplement.   3. Fecal smearing Improving with increase in fiber. Continue dietary modifications at this time.   4. Chronic right-sided low back pain without sciatica Worsening pain, she has had appt with physical medicine, PT consult has been done and plan for injection  5. Coronary artery disease of native artery of native heart with stable angina pectoris (HCC) Reports more fullness in chest with activity. Will send her back to cardiology for further work up. She is in agreement for this. Encouraged to call 911 if symptoms worsens or she has chest pain.  - Lipid panel  6. Permanent  atrial fibrillation (HCC) Rate controlled   7. Stage 3a chronic kidney disease (HCC) -Chronic and stable Encourage proper hydration Follow metabolic panel Avoid nephrotoxic meds (NSAIDS) - COMPLETE METABOLIC PANEL WITH GFR  8. Anemia, unspecified type Will follow up lab - CBC with Differential/Platelet  9. Vitamin B12 deficiency -continues on supplement   - Vitamin B12  10. Shortness of breath with activity Increase fatigue and shortness of breath with activity. Also reports wheezing -will get lab work as well as chest xray for further evaluation at this time - DG Chest 2 View; Future  Sheron Tallman K. Biagio Borg  [facility] [phone]

## 2023-04-12 LAB — COMPLETE METABOLIC PANEL WITH GFR
AG Ratio: 1.8 (calc) (ref 1.0–2.5)
ALT: 6 U/L (ref 6–29)
AST: 14 U/L (ref 10–35)
Albumin: 4 g/dL (ref 3.6–5.1)
Alkaline phosphatase (APISO): 53 U/L (ref 37–153)
BUN/Creatinine Ratio: 22 (calc) (ref 6–22)
BUN: 32 mg/dL — ABNORMAL HIGH (ref 7–25)
CO2: 25 mmol/L (ref 20–32)
Calcium: 9.4 mg/dL (ref 8.6–10.4)
Chloride: 109 mmol/L (ref 98–110)
Creat: 1.47 mg/dL — ABNORMAL HIGH (ref 0.60–0.95)
Globulin: 2.2 g/dL (ref 1.9–3.7)
Glucose, Bld: 91 mg/dL (ref 65–99)
Potassium: 4.9 mmol/L (ref 3.5–5.3)
Sodium: 140 mmol/L (ref 135–146)
Total Bilirubin: 0.8 mg/dL (ref 0.2–1.2)
Total Protein: 6.2 g/dL (ref 6.1–8.1)
eGFR: 33 mL/min/{1.73_m2} — ABNORMAL LOW (ref >=60)

## 2023-04-12 LAB — CBC WITH DIFFERENTIAL/PLATELET
Absolute Monocytes: 756 {cells}/uL (ref 200–950)
Basophils Absolute: 101 {cells}/uL (ref 0–200)
Basophils Relative: 1.6 %
Eosinophils Absolute: 0 {cells}/uL — ABNORMAL LOW (ref 15–500)
Eosinophils Relative: 0 %
HCT: 34 % — ABNORMAL LOW (ref 35.0–45.0)
Hemoglobin: 10.9 g/dL — ABNORMAL LOW (ref 11.7–15.5)
Lymphs Abs: 1487 {cells}/uL (ref 850–3900)
MCH: 30.1 pg (ref 27.0–33.0)
MCHC: 32.1 g/dL (ref 32.0–36.0)
MCV: 93.9 fL (ref 80.0–100.0)
MPV: 10.5 fL (ref 7.5–12.5)
Monocytes Relative: 12 %
Neutro Abs: 3956 {cells}/uL (ref 1500–7800)
Neutrophils Relative %: 62.8 %
Platelets: 432 10*3/uL — ABNORMAL HIGH (ref 140–400)
RBC: 3.62 10*6/uL — ABNORMAL LOW (ref 3.80–5.10)
RDW: 13.2 % (ref 11.0–15.0)
Total Lymphocyte: 23.6 %
WBC: 6.3 10*3/uL (ref 3.8–10.8)

## 2023-04-12 LAB — LIPID PANEL
Cholesterol: 165 mg/dL (ref <200)
HDL: 57 mg/dL (ref >=50)
LDL Cholesterol (Calc): 90 mg/dL
Non-HDL Cholesterol (Calc): 108 mg/dL (ref <130)
Total CHOL/HDL Ratio: 2.9 (calc) (ref <5.0)
Triglycerides: 87 mg/dL (ref <150)

## 2023-04-12 LAB — VITAMIN B12: Vitamin B-12: 1815 pg/mL — ABNORMAL HIGH (ref 200–1100)

## 2023-04-19 ENCOUNTER — Telehealth: Payer: Self-pay

## 2023-04-19 DIAGNOSIS — I519 Heart disease, unspecified: Secondary | ICD-10-CM

## 2023-04-19 MED ORDER — FUROSEMIDE 20 MG PO TABS: 20.0000 mg | ORAL_TABLET | Freq: Every day | ORAL | 0 refills | Status: DC

## 2023-04-19 NOTE — Telephone Encounter (Signed)
The patient has been notified of the result and verbalized understanding.  All questions (if any) were answered. [name], [place] 04/19/2023 1:35 PM

## 2023-04-27 ENCOUNTER — Telehealth: Payer: Self-pay | Admitting: Physician Assistant

## 2023-04-27 NOTE — Telephone Encounter (Signed)
Pt c/o medication issue:  1. Name of Medication:   furosemide (LASIX) 20 MG tablet    2. How are you currently taking this medication (dosage and times per day)? As written   3. Are you having a reaction (difficulty breathing--STAT)? No   4. What is your medication issue? Pt called in stating this medication is making her dizzy and she wants to know what she should until her appt Thursday with Shea Evans, [place]

## 2023-04-27 NOTE — Telephone Encounter (Signed)
Spoke with patient on the phone, she states she has been dizzy since starting the lasix. Patient denies swelling. Patient states her BP this morning was 95/67 and states she is currently at physical therapy and her BP is 134/70.   Patient is going to hold lasix until her appointment on Thursday. Patient advised to call her office if her symptoms worsen or don't get better while holding the lasix.

## 2023-04-28 NOTE — Progress Notes (Unsigned)
Cardiology Office Note    Date:  04/29/2023   ID:  Dashona, Plourde [birth year], MRN [id number]  PCP:  [name], NP  Cardiologist:  Debbe Odea, MD  Electrophysiologist:  None   Chief Complaint: Dizziness and exertional dyspnea  History of Present Illness:   Brittney [name] is a 89 y.o. female with history of permanent A-fib, systolic dysfunction, TIA, CKD stage III, HTN, HLD, and lumbar radiculitis who presents for evaluation of dizziness and exertional dyspnea.   She was previously followed by [facility], and underwent cardiac cath in 2008 which showed no angiographically apparent CAD in the major epicardial arteries.  Calcium score in 2013 of 129.73.  Nuclear stress test in 2015 showed no evidence of ischemia with normal LV function.  She was admitted to the hospital in 2019 with a suspected TIA.  Carotid artery ultrasound at that time showed no hemodynamically significant stenosis in the bilateral carotid arteries with antegrade flow of the bilateral vertebral arteries.  Echo demonstrated an EF of 45 to 50% with hypokinesis of the inferior myocardium and a mildly dilated left atrium.  Notes indicate she was diagnosed with A-fib in 10/2020, and initiated on apixaban.  She established care with our group in 2022.  Subsequent echo in 07/2021 showed a stable LV systolic function with an EF of 45 to 50%, no regional wall motion abnormalities, indeterminate LV diastolic function parameters, normal RV systolic function and ventricular cavity size, severely dilated left atrium, mildly dilated right atrium, mild to moderate mitral regurgitation, mild to moderate tricuspid regurgitation, and an estimated right atrial pressure of 8 mmHg.  She was seen in our office in 08/2021 and reported a possible TIA several weeks prior.  She was initiated on aspirin with continuation of apixaban and referred to neurology with recommendation to be maintained on apixaban and aspirin.  MRI of the  brain showed no evidence of acute intracranial abnormality with mild to moderate chronic small vessel ischemic changes that had progressed from study in 2019 as well as mild generalized cerebral atrophy.  She was last seen in the office in 08/2022 noting a several month history of exertional shortness of breath when walking up an incline without frank chest pain.  Symptoms were largely unchanged when compared to her prior visit and would last for a couple of seconds with spontaneous resolution.  She also wondered if rosuvastatin was contributing to some of her lower extremity discomfort.  In this setting, she underwent echo in 09/2022 which showed an EF of 60 to 65%, no regional wall motion abnormalities, mild LVH, grade 2 diastolic dysfunction, normal RV systolic function and ventricular cavity size, mildly elevated RVSP at 38.8 mmHg, moderately dilated left atrium, mild to moderate mitral regurgitation, mild to moderate tricuspid regurgitation, mild aortic insufficiency, and an estimated right atrial pressure of 3 mmHg.  She was evaluated at her PCPs office on 04/08/2019 for noting an increase in fatigue and dyspnea with associated chest discomfort.  Reported ankle swelling at the end of the day.  Chest x-ray was obtained which demonstrated vascular congestion.  In this setting, primary cardiologist initiated the patient on furosemide 20 mg daily.  However, patient subsequently notified our office that she was experiencing dizziness with initiation of furosemide.  With this, furosemide was held on 9/4.  She comes in today and is without symptoms of active angina or cardiac decompensation.  She notes an increase in dizziness following the initiation of furosemide.  She feels like her breathing is  overall stable with continued shortness of breath and chest heaviness with ambulation that is longstanding.  No significant lower extremity swelling, abdominal distention, or progressive orthopnea.  No near-syncope or  syncope.  No falls or symptoms concerning for bleeding.  Her weight is down 2 pounds today compared to her visit in 08/2022.   Labs independently reviewed: 03/2023 - Hgb 10.9, PLT 432, BUN 32, serum creatinine 1.47, potassium 4.9, albumin 4.0, AST/ALT normal, TC 165, TG 87, HDL 57, LDL 90 08/2021 - TSH normal   Past Medical History:  Diagnosis Date   Adopted person    Allergy    Anemia    Angina pectoris (HCC)    Aortic atherosclerosis (HCC)    Arthritis    Atrial fibrillation (HCC)    a. ) CHA2DS2-VASc = 7 (age x 2, sex, HTN, TIA x2, aortic plaque);  b.) rate/rhythm maintained on oral metoprolol succinate; chronically anticoagulated using dose reduced apixaban.   Bilateral carotid artery disease (HCC)    CAD (coronary artery disease)    a.) LHC 02/09/2007: EF 60%, LVEDP 25 mmHg, normal cors; b.) cCTA 11/27/2011: Ca score 129.73 --> 1.17 RCA, 16.36 LM, 112.21 LAD, 0 LCx; c.) MPI 03/13/2014 - normal.   Cataract    CKD (chronic kidney disease), stage IV (HCC)    Diastolic dysfunction 02/09/2008   a.) LHC 02/09/2008: EF 60%, LVEDP 25 mmHg; b.) TTE 08/07/2010: EF 65%, mild LVH, mild MR, G2DD; b.) TTE 08/20/2018: EF 45-50%, inferior HK, mild LAE; c.) TTE 08/11/2021: EF 45-50%. 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   Left carotid bruit    Long term current use of anticoagulant    a.) apixaban   Lumbar stenosis    Lymphocytic colitis    Osteoporosis    TIA (transient ischemic attack) 07/2018   TIA (transient ischemic attack) 08/2021   Vitamin B12 deficiency    Wears hearing BILATERAL aids    Wears partial (bottom) dentures     Past Surgical History:  Procedure Laterality Date   BREAST EXCISIONAL BIOPSY Right 36 years ago   neg   BUNIONECTOMY Bilateral    COLONOSCOPY N/A 07/02/2008   KNEE ARTHROPLASTY Right  03/11/2022   Procedure: COMPUTER ASSISTED TOTAL KNEE ARTHROPLASTY;  Surgeon: Donato Heinz, MD;  Location: [facility] ORS;  Service: Orthopedics;  Laterality: Right;   KNEE ARTHROSCOPY Right 2006   LEFT HEART CATH AND CORONARY ANGIOGRAPHY Left 02/09/2007   Procedure: LEFT HEART CATH AND CORONARY ANGIOGRAPHY; Location: [facility]   TONSILLECTOMY AND ADENOIDECTOMY Bilateral    TOTAL ABDOMINAL HYSTERECTOMY W/ BILATERAL SALPINGOOPHORECTOMY N/A    TOTAL KNEE ARTHROPLASTY Left 04/26/2014   TUBAL LIGATION      Current Medications: Current Meds  Medication Sig   acetaminophen (TYLENOL) 500 MG tablet Take 1,000 mg by mouth 2 (two) times daily as needed.   Cholecalciferol (VITAMIN D) 50 MCG (2000 UT) tablet Take 2,000 Units by mouth daily.   cyanocobalamin (VITAMIN B12) 1000 MCG tablet Take 1,000 mcg by mouth 3 (three) times a week.   EPINEPHrine 0.3 mg/0.3 mL IJ SOAJ injection Inject 0.3 mg into the muscle as needed for anaphylaxis.   fluticasone (FLONASE) 50 MCG/ACT nasal spray Place 2 sprays into both nostrils as needed.   guaiFENesin (MUCINEX) 600 MG 12 hr tablet Take 1  tablet (600 mg total) by mouth 2 (two) times daily.   loratadine (CLARITIN) 10 MG tablet Take 10 mg by mouth daily.   Olopatadine HCl (PAZEO) 0.7 % SOLN Place 1 drop into both eyes daily as needed.   Rivaroxaban (XARELTO) 15 MG TABS tablet Take 1 tablet (15 mg total) by mouth daily with supper.   triamcinolone cream (KENALOG) 0.1 % Apply 1 Application topically 2 (two) times daily as needed.   [DISCONTINUED] metoprolol succinate (TOPROL-XL) 25 MG 24 hr tablet TAKE 1 TABLET DAILY    Allergies:   Lisinopril, Mixed grasses, Bee venom, Azithromycin, Baclofen, Iodine, Morphine and codeine, Northern quahog clam (m. mercenaria) skin test, and Sulfa antibiotics   Social History   Socioeconomic History   Marital status: Widowed    Spouse name: Not on file   Number of children: Not on file   Years of education: Not on file    Highest education level: Not on file  Occupational History   Not on file  Tobacco Use   Smoking status: Never   Smokeless tobacco: Never  Vaping Use   Vaping status: Never Used  Substance and Sexual Activity   Alcohol use: Not Currently   Drug use: Never   Sexual activity: Not Currently  Other Topics Concern   Not on file  Social History Narrative   Not on file   Social Determinants of Health   Financial Resource Strain: Not on file  Food Insecurity: Not on file  Transportation Needs: Not on file  Physical Activity: Not on file  Stress: Not on file  Social Connections: Not on file     Family History:  The patient's family history includes Heart disease in her sister; Stroke in her brother. She was adopted.  ROS:   12-point review of systems is negative unless otherwise noted in the HPI.   EKGs/Labs/Other Studies Reviewed:    Studies reviewed were summarized above. The additional studies were reviewed today:  2D echo 10/02/2022: 1. Left ventricular ejection fraction, by estimation, is 60 to 65%. The  left ventricle has normal function. The left ventricle has no regional  wall motion abnormalities. There is mild left ventricular hypertrophy.  Left ventricular diastolic parameters  are consistent with Grade II diastolic dysfunction (pseudonormalization).  The average left ventricular global longitudinal strain is -8.0 %. The  global longitudinal strain is abnormal.   2. Right ventricular systolic function is normal. The right ventricular  size is normal. There is mildly elevated pulmonary artery systolic  pressure. The estimated right ventricular systolic pressure is 38.8 mmHg.   3. Left atrial size was moderately dilated.   4. The mitral valve is normal in structure. Mild to moderate mitral valve  regurgitation. No evidence of mitral stenosis.   5. Tricuspid valve regurgitation is mild to moderate.   6. The aortic valve is tricuspid. Aortic valve regurgitation is  mild. No  aortic stenosis is present.   7. The inferior vena cava is normal in size with greater than 50%  respiratory variability, suggesting right atrial pressure of 3 mmHg.  __________  2D echo 08/11/2021: 1. Left ventricular ejection fraction, by estimation, is 45 to 50%. The  left ventricle has mildly decreased function. The left ventricle has no  regional wall motion abnormalities. Left ventricular diastolic parameters  are indeterminate.   2. Right ventricular systolic function is normal. The right ventricular  size is normal.   3. Left atrial size was severely dilated.   4. Right atrial size was  mildly dilated.   5. The mitral valve is normal in structure. Mild to moderate mitral valve  regurgitation.   6. Tricuspid valve regurgitation is mild to moderate.   7. The aortic valve is tricuspid. Aortic valve regurgitation is not  visualized.   8. The inferior vena cava is normal in size with <50% respiratory  variability, suggesting right atrial pressure of 8 mmHg.  __________   2D echo 08/20/2018: - Left ventricle: The cavity size was mildly dilated. Wall    thickness was normal. Systolic function was mildly reduced. The    estimated ejection fraction was in the range of 45% to 50%.    Hypokinesis of the inferior myocardium.  - Left atrium: The atrium was mildly dilated.   Impressions:   - Mildly reduced LVF    Inferior Hypokinesis    EF=45-50%    Normal Right side. No cardiac source of emboli was indentified.  __________   Carotid artery ultrasound 08/19/2018: IMPRESSION: No hemodynamically significant stenosis is noted in either cervical carotid artery.   EKG:  EKG is ordered today.  The EKG ordered today demonstrates A-fib, 71 bpm, left axis deviation, incomplete RBBB, poor R wave progression along the precordial leads, LVH, consistent with prior tracing  Recent Labs: 04/12/2023: ALT 6; BUN 32; Creat 1.47; Hemoglobin 10.9; Platelets 432; Potassium 4.9; Sodium  140  Recent Lipid Panel    Component Value Date/Time   CHOL 165 04/12/2023 0735   TRIG 87 04/12/2023 0735   HDL 57 04/12/2023 0735   CHOLHDL 2.9 04/12/2023 0735   VLDL 21 09/04/2022 1129   LDLCALC 90 04/12/2023 0735   LDLDIRECT 58 09/04/2022 1129    PHYSICAL EXAM:    VS:  BP 130/62 (BP Location: Left Arm, Patient Position: Sitting, Cuff Size: Normal)   Pulse 71   Ht 4\' 11"  (1.499 m)   Wt 150 lb 6.4 oz (68.2 kg)   SpO2 95%   BMI 30.38 kg/m   BMI: Body mass index is 30.38 kg/m.  Physical Exam Vitals reviewed.  Constitutional:      Appearance: She is well-developed.  HENT:     Head: Normocephalic and atraumatic.  Eyes:     General:        Right eye: No discharge.        Left eye: No discharge.  Neck:     Vascular: No JVD.  Cardiovascular:     Rate and Rhythm: Normal rate. Rhythm irregularly irregular.     Heart sounds: S1 normal and S2 normal. Heart sounds not distant. No midsystolic click and no opening snap. Murmur heard.     Systolic murmur is present with a grade of 1/6 at the apex.     No friction rub.  Pulmonary:     Effort: Pulmonary effort is normal. No respiratory distress.     Breath sounds: Normal breath sounds. No decreased breath sounds, wheezing or rales.  Chest:     Chest wall: No tenderness.  Abdominal:     General: There is no distension.  Musculoskeletal:     Cervical back: Normal range of motion.     Right lower leg: No edema.     Left lower leg: No edema.  Skin:    General: Skin is warm and dry.     Nails: There is no clubbing.  Neurological:     Mental Status: She is alert and oriented to person, place, and time.  Psychiatric:        Speech: Speech normal.  Behavior: Behavior normal.        Thought Content: Thought content normal.        Judgment: Judgment normal.     Wt Readings from Last 3 Encounters:  04/29/23 150 lb 6.4 oz (68.2 kg)  04/08/23 156 lb (70.8 kg)  01/13/23 153 lb (69.4 kg)      Orthostatic VS for the past  24 hrs:  BP- Lying Pulse- Lying BP- Sitting Pulse- Sitting BP- Standing at 0 minutes Pulse- Standing at 0 minutes  04/29/23 1129 144/72 75 128/65 72 96/58 75      ASSESSMENT & PLAN:   Orthostatic dizziness: Appears volume depleted on exam.  Agree with holding of furosemide.  Reduce Toprol-XL to 12.5 mg daily.  She does not drink much water throughout the day at baseline.  Slow positional changes with increasing hydration recommended.  Exertional dyspnea: Longstanding issue.  Recent echo showed preserved LV systolic function with grade 2 diastolic dysfunction.  Schedule Lexiscan MPI to evaluate for high risk ischemia.  Permanent A-fib: Ventricular rate is well-controlled.  Reduce Toprol-XL to 12.5 mg daily in the setting of orthostatic dizziness.  (HTN, age x 2, TIA x 2, vascular disease, sex category).  She remains on rivaroxaban 15 mg with a creatinine clearance of 26.6.  Grade 2 diastolic dysfunction: Euvolemic to volume depleted.  Agree with holding of furosemide.  HTN: Orthostatic in the office today.  Management as outlined above.  HLD: LDL 90 in 03/2023 with normal AST/ALT at that time.  Previously on rosuvastatin.  Based on CT attenuation corrected imaging of Lexiscan, may need to adjust statin therapy.  Mitral regurgitation: Mild to moderate mitral regurgitation by echo in 09/2022.  Monitor periodically.  History of TIA: Remains on renally dosed rivaroxaban as outlined above.  CKD stage IIIb: BMP pending.   Informed Consent   Shared Decision Making/Informed Consent{  The risks [chest pain, shortness of breath, cardiac arrhythmias, dizziness, blood pressure fluctuations, myocardial infarction, stroke/transient ischemic attack, nausea, vomiting, allergic reaction, radiation exposure, metallic taste sensation and life-threatening complications (estimated to be 1 in 10,000)], benefits (risk stratification, diagnosing coronary artery disease, treatment guidance) and alternatives of a  nuclear stress test were discussed in detail with Brittney [name] and she agrees to proceed.        Disposition: F/u with Dr. Azucena Cecil or an APP in 2 months.   Medication Adjustments/Labs and Tests Ordered: Current medicines are reviewed at length with the patient today.  Concerns regarding medicines are outlined above. Medication changes, Labs and Tests ordered today are summarized above and listed in the Patient Instructions accessible in Encounters.   Signed, Eula Listen, PA-C 04/29/2023 1:01 PM     [facility] [address] [place], [place] [address] [phone]

## 2023-04-29 ENCOUNTER — Ambulatory Visit: Payer: Medicare Other | Attending: Physician Assistant | Admitting: Physician Assistant

## 2023-04-29 ENCOUNTER — Encounter: Payer: Self-pay | Admitting: Physician Assistant

## 2023-04-29 VITALS — BP 130/62 | HR 71 | Ht 59.0 in | Wt 150.4 lb

## 2023-04-29 DIAGNOSIS — I340 Nonrheumatic mitral (valve) insufficiency: Secondary | ICD-10-CM | POA: Diagnosis present

## 2023-04-29 DIAGNOSIS — I4821 Permanent atrial fibrillation: Secondary | ICD-10-CM | POA: Insufficient documentation

## 2023-04-29 DIAGNOSIS — N1832 Chronic kidney disease, stage 3b: Secondary | ICD-10-CM | POA: Insufficient documentation

## 2023-04-29 DIAGNOSIS — R42 Dizziness and giddiness: Secondary | ICD-10-CM | POA: Insufficient documentation

## 2023-04-29 DIAGNOSIS — I10 Essential (primary) hypertension: Secondary | ICD-10-CM | POA: Insufficient documentation

## 2023-04-29 DIAGNOSIS — G459 Transient cerebral ischemic attack, unspecified: Secondary | ICD-10-CM | POA: Insufficient documentation

## 2023-04-29 DIAGNOSIS — I5189 Other ill-defined heart diseases: Secondary | ICD-10-CM | POA: Insufficient documentation

## 2023-04-29 DIAGNOSIS — E785 Hyperlipidemia, unspecified: Secondary | ICD-10-CM | POA: Insufficient documentation

## 2023-04-29 DIAGNOSIS — R0609 Other forms of dyspnea: Secondary | ICD-10-CM | POA: Diagnosis present

## 2023-04-29 LAB — BASIC METABOLIC PANEL
BUN/Creatinine Ratio: 24 (ref 12–28)
BUN: 39 mg/dL — ABNORMAL HIGH (ref 10–36)
CO2: 22 mmol/L (ref 20–29)
Calcium: 9.7 mg/dL (ref 8.7–10.3)
Chloride: 103 mmol/L (ref 96–106)
Creatinine, Ser: 1.65 mg/dL — ABNORMAL HIGH (ref 0.57–1.00)
Glucose: 108 mg/dL — ABNORMAL HIGH (ref 70–99)
Potassium: 4.9 mmol/L (ref 3.5–5.2)
Sodium: 142 mmol/L (ref 134–144)
eGFR: 29 mL/min/{1.73_m2} — ABNORMAL LOW (ref >59)

## 2023-04-29 MED ORDER — METOPROLOL SUCCINATE ER 25 MG PO TB24: 12.5000 mg | ORAL_TABLET | Freq: Every day | ORAL | 3 refills | Status: DC

## 2023-04-29 NOTE — Patient Instructions (Signed)
Medication Instructions:  Your physician recommends the following medication changes.  STOP TAKING: Lasix  DECREASE: Metoprolol 12.5 mg daily  *If you need a refill on your cardiac medications before your next appointment, please call your pharmacy*  Lab Work: NONE If you have labs (blood work) drawn today and your tests are completely normal, you will receive your results only by: MyChart Message (if you have MyChart) OR A paper copy in the mail If you have any lab test that is abnormal or we need to change your treatment, we will call you to review the results.  Testing/Procedures: Your provider has ordered a Lexiscan/ Exercise Myoview Stress test. This will take place at [facility]. Please report to the [facility] medical mall entrance. The volunteers at the first desk will direct you where to go.  [facility] MYOVIEW  Your provider has ordered a Stress Test with nuclear imaging. The purpose of this test is to evaluate the blood supply to your heart muscle. This procedure is referred to as a "Non-Invasive Stress Test." This is because other than having an IV started in your vein, nothing is inserted or "invades" your body. Cardiac stress tests are done to find areas of poor blood flow to the heart by determining the extent of coronary artery disease (CAD). Some patients exercise on a treadmill, which naturally increases the blood flow to your heart, while others who are unable to walk on a treadmill due to physical limitations will have a pharmacologic/chemical stress agent called Lexiscan . This medicine will mimic walking on a treadmill by temporarily increasing your coronary blood flow.   Please note: these test may take anywhere between 2-4 hours to complete  How to prepare for your Myoview test:  Nothing to eat for 6 hours prior to the test No caffeine for 24 hours prior to test No smoking 24 hours prior to test. Your medication may be taken with water.  If your doctor stopped a medication  because of this test, do not take that medication. Ladies, please do not wear dresses.  Skirts or pants are appropriate. Please wear a short sleeve shirt. No perfume, cologne or lotion. Wear comfortable walking shoes. No heels!   PLEASE NOTIFY THE OFFICE AT LEAST 24 HOURS IN ADVANCE IF YOU ARE UNABLE TO KEEP YOUR APPOINTMENT.  [phone] AND  PLEASE NOTIFY NUCLEAR MEDICINE AT [facility] AT LEAST 24 HOURS IN ADVANCE IF YOU ARE UNABLE TO KEEP YOUR APPOINTMENT. [phone]   Follow-Up: At [facility], you and your health needs are our priority.  As part of our continuing mission to provide you with exceptional heart care, we have created designated Provider Care Teams.  These Care Teams include your primary Cardiologist (physician) and Advanced Practice Providers (APPs -  Physician Assistants and Nurse Practitioners) who all work together to provide you with the care you need, when you need it.  We recommend signing up for the patient portal called "MyChart".  Sign up information is provided on this After Visit Summary.  MyChart is used to connect with patients for Virtual Visits (Telemedicine).  Patients are able to view lab/test results, encounter notes, upcoming appointments, etc.  Non-urgent messages can be sent to your provider as well.   To learn more about what you can do with MyChart, go to [email or web address].    Your next appointment:   2 month(s)  Provider:   You may see Debbe Odea, MD or one of the following Advanced Practice Providers on your designated Care Team:  Eula Listen, PA-C

## 2023-05-07 ENCOUNTER — Encounter
Admission: RE | Admit: 2023-05-07 | Discharge: 2023-05-07 | Disposition: A | Discharge status: Home / Self Care | Payer: Medicare Other | Source: Ambulatory Visit | Attending: Physician Assistant | Admitting: Physician Assistant

## 2023-05-07 DIAGNOSIS — R0609 Other forms of dyspnea: Secondary | ICD-10-CM | POA: Insufficient documentation

## 2023-05-07 LAB — NM MYOCAR MULTI W/SPECT W/WALL MOTION / EF
LV dias vol: 70 mL (ref 46–106)
LV sys vol: 27 mL
Nuc Stress EF: 61 %
Peak HR: 112 {beats}/min
Percent HR: 86 %
Rest HR: 95 {beats}/min
Rest Nuclear Isotope Dose: 10 mCi
SDS: 1
SRS: 3
SSS: 2
ST Depression (mm): 0 mm
Stress Nuclear Isotope Dose: 30.6 mCi
TID: 0.94

## 2023-05-07 MED ORDER — REGADENOSON 0.4 MG/5ML IV SOLN
0.4000 mg | Freq: Once | INTRAVENOUS | Status: AC
  Administered 2023-05-07: 0.4 mg via INTRAVENOUS

## 2023-05-07 MED ORDER — TECHNETIUM TC 99M TETROFOSMIN IV KIT
30.6000 | PACK | Freq: Once | INTRAVENOUS | Status: AC | PRN
  Administered 2023-05-07: 30.6 via INTRAVENOUS

## 2023-05-07 MED ORDER — TECHNETIUM TC 99M TETROFOSMIN IV KIT
10.0000 | PACK | Freq: Once | INTRAVENOUS | Status: AC | PRN
  Administered 2023-05-07: 10.01 via INTRAVENOUS

## 2023-05-12 ENCOUNTER — Telehealth: Payer: Self-pay | Admitting: Physician Assistant

## 2023-05-12 DIAGNOSIS — E785 Hyperlipidemia, unspecified: Secondary | ICD-10-CM

## 2023-05-12 MED ORDER — ROSUVASTATIN CALCIUM 5 MG PO TABS
5.0000 mg | ORAL_TABLET | Freq: Every day | ORAL | Status: DC
Start: 2023-05-12 — End: 2023-09-06

## 2023-05-12 NOTE — Telephone Encounter (Signed)
Patient made aware of results and verbalized understanding. The patient stated she was taking Rosuvastatin 5 mg (splitting the 10mg  tablet) and would be willing to take that again. Repeat lab orders placed. She did not need a refill at this time.    Sondra Barges, PA-C 05/08/2023  7:27 AM EDT     Please inform the patient her stress test showed no evidence of blockages or heart attack.  CT attenuated corrected images show aortic atherosclerosis and coronary artery calcification.  Overall, this was a low risk study.  Would recommend resumption of rosuvastatin 10 mg and initiating aspirin 81 mg daily if she is agreeable.  Would follow-up fasting lipid panel and LFT in 3 months.

## 2023-05-12 NOTE — Telephone Encounter (Signed)
Pt states that she is returning a call from yesterday to nurse Danelle Earthly regarding results. Please advise

## 2023-05-12 NOTE — Telephone Encounter (Signed)
Agree, no aspirin given she is on rivaroxaban.  Okay to take rosuvastatin 5 mg daily with a follow-up fasting lipid panel and LFT in 3 months.

## 2023-06-25 NOTE — Progress Notes (Unsigned)
Cardiology Office Note    Date:  06/30/2023   ID:  Brittney [name], DOB 01-09-1930, MRN [id number]  PCP:  [name], NP  Cardiologist:  Debbe Odea, MD  Electrophysiologist:  None   Chief Complaint: Follow-up  History of Present Illness:   Brittney [name] is a 89 y.o. female with history of permanent A-fib, systolic dysfunction, TIA, CKD stage III, HTN, HLD, and lumbar radiculitis who presents for follow-up of Lexiscan MPI.   She was previously followed by [facility], and underwent cardiac cath in 2008 which showed no angiographically apparent CAD in the major epicardial arteries.  Calcium score in 2013 of 129.73.  Nuclear stress test in 2015 showed no evidence of ischemia with normal LV function.  She was admitted to the hospital in 2019 with a suspected TIA.  Carotid artery ultrasound at that time showed no hemodynamically significant stenosis in the bilateral carotid arteries with antegrade flow of the bilateral vertebral arteries.  Echo demonstrated an EF of 45 to 50% with hypokinesis of the inferior myocardium and a mildly dilated left atrium.  Notes indicate she was diagnosed with A-fib in 10/2020, and initiated on apixaban.  She established care with our group in 2022.  Subsequent echo in 07/2021 showed a stable LV systolic function with an EF of 45 to 50%, no regional wall motion abnormalities, indeterminate LV diastolic function parameters, normal RV systolic function and ventricular cavity size, severely dilated left atrium, mildly dilated right atrium, mild to moderate mitral regurgitation, mild to moderate tricuspid regurgitation, and an estimated right atrial pressure of 8 mmHg.  She was seen in our office in 08/2021 and reported a possible TIA several weeks prior.  She was initiated on aspirin with continuation of apixaban and referred to neurology with recommendation to be maintained on apixaban and aspirin.  MRI of the brain showed no evidence of acute  intracranial abnormality with mild to moderate chronic small vessel ischemic changes that had progressed from study in 2019 as well as mild generalized cerebral atrophy.  She was seen in the office in 08/2022 noting a several month history of exertional shortness of breath when walking up an incline without frank chest pain.  Symptoms were largely unchanged when compared to her prior visit and would last for a couple of seconds with spontaneous resolution.  She also wondered if rosuvastatin was contributing to some of her lower extremity discomfort.  In this setting, she underwent echo in 09/2022 which showed an EF of 60 to 65%, no regional wall motion abnormalities, mild LVH, grade 2 diastolic dysfunction, normal RV systolic function and ventricular cavity size, mildly elevated RVSP at 38.8 mmHg, moderately dilated left atrium, mild to moderate mitral regurgitation, mild to moderate tricuspid regurgitation, mild aortic insufficiency, and an estimated right atrial pressure of 3 mmHg.   She was evaluated at her PCPs office on 04/08/2023 noting an increase in fatigue and dyspnea with associated chest discomfort.  Reported ankle swelling at the end of the day.  Chest x-ray was obtained which demonstrated vascular congestion.  In this setting, primary cardiologist initiated the patient on furosemide 20 mg daily.  However, patient subsequently notified our office that she was experiencing dizziness with initiation of furosemide.  With this, furosemide was held on 04/28/2023.  She was last seen in the office on 04/29/2023 noting an increase in dizziness following initiation of furosemide.  She felt like her breathing was overall stable with continued shortness of breath and chest heaviness with ambulation that was  longstanding.  There was no significant lower extremity swelling.  Toprol was reduced to 12.5 mg daily with recommendation to continue to hold furosemide.  To further evaluate her longstanding exertional dyspnea,  she underwent Lexiscan MPI on 05/07/2023 that showed no evidence of ischemia or infarction with normal LV systolic function and was overall low risk.  CT attenuated corrected images demonstrated coronary artery calcification and aortic atherosclerosis.  She comes in today noting unchanged chronic dyspnea with ambulation.  No symptoms of chest pain or cardiac decompensation.  She did feel like she needed to take a furosemide 1 time and since she was last seen as she felt like she was holding onto fluid, however she did not.  He remains a stable.  She does note some intermittent lower extremity swelling.  No progressive orthopnea.  No falls, hematochezia, or melena.  No prior asthma, never smoked.  No presyncope or syncope.   Labs independently reviewed: 04/2023 - BUN 39, serum creatinine 1.65, potassium 4.9 03/2023 - Hgb 10.9, PLT 432, albumin 4.0, AST/ALT normal, TC 165, TG 87, HDL 57, LDL 90 08/2021 - TSH normal   Past Medical History:  Diagnosis Date   Adopted person    Allergy    Anemia    Angina pectoris (HCC)    Aortic atherosclerosis (HCC)    Arthritis    Atrial fibrillation (HCC)    a. ) CHA2DS2-VASc = 7 (age x 2, sex, HTN, TIA x2, aortic plaque);  b.) rate/rhythm maintained on oral metoprolol succinate; chronically anticoagulated using dose reduced apixaban.   Bilateral carotid artery disease (HCC)    CAD (coronary artery disease)    a.) LHC 02/09/2007: EF 60%, LVEDP 25 mmHg, normal cors; b.) cCTA 11/27/2011: Ca score 129.73 --> 1.17 RCA, 16.36 LM, 112.21 LAD, 0 LCx; c.) MPI 03/13/2014 - normal.   Cataract    CKD (chronic kidney disease), stage IV (HCC)    Diastolic dysfunction 02/09/2008   a.) LHC 02/09/2008: EF 60%, LVEDP 25 mmHg; b.) TTE 08/07/2010: EF 65%, mild LVH, mild MR, G2DD; b.) TTE 08/20/2018: EF 45-50%, inferior HK, mild LAE; c.) TTE 08/11/2021: EF 45-50%. 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   Left carotid bruit    Long term current use of anticoagulant    a.) apixaban   Lumbar stenosis    Lymphocytic colitis    Osteoporosis    TIA (transient ischemic attack) 07/2018   TIA (transient ischemic attack) 08/2021   Vitamin B12 deficiency    Wears hearing BILATERAL aids    Wears partial (bottom) dentures     Past Surgical History:  Procedure Laterality Date   BREAST EXCISIONAL BIOPSY Right 36 years ago   neg   BUNIONECTOMY Bilateral    COLONOSCOPY N/A 07/02/2008   KNEE ARTHROPLASTY Right 03/11/2022   Procedure: COMPUTER ASSISTED TOTAL KNEE ARTHROPLASTY;  Surgeon: Donato Heinz, MD;  Location: [facility] ORS;  Service: Orthopedics;  Laterality: Right;   KNEE ARTHROSCOPY Right 2006   LEFT HEART CATH AND CORONARY ANGIOGRAPHY Left 02/09/2007   Procedure: LEFT HEART CATH AND CORONARY ANGIOGRAPHY; Location: [facility]   TONSILLECTOMY AND ADENOIDECTOMY Bilateral    TOTAL ABDOMINAL HYSTERECTOMY W/ BILATERAL SALPINGOOPHORECTOMY N/A    TOTAL KNEE ARTHROPLASTY Left 04/26/2014   TUBAL LIGATION      Current  Medications: Current Meds  Medication Sig   acetaminophen (TYLENOL) 500 MG tablet Take 1,000 mg by mouth 2 (two) times daily as needed.   Cholecalciferol (VITAMIN D) 50 MCG (2000 UT) tablet Take 2,000 Units by mouth daily.   cyanocobalamin (VITAMIN B12) 1000 MCG tablet Take 1,000 mcg by mouth 3 (three) times a week.   EPINEPHrine 0.3 mg/0.3 mL IJ SOAJ injection Inject 0.3 mg into the muscle as needed for anaphylaxis.   fluticasone (FLONASE) 50 MCG/ACT nasal spray Place 2 sprays into both nostrils as needed.   furosemide (LASIX) 20 MG tablet Take 0.5 tablets (10 mg total) by mouth daily as needed (shortness of breath).   loratadine (CLARITIN) 10 MG tablet Take 10 mg by mouth daily.   metoprolol succinate (TOPROL-XL) 25 MG 24 hr tablet Take 0.5 tablets (12.5 mg total) by  mouth daily.   Olopatadine HCl (PAZEO) 0.7 % SOLN Place 1 drop into both eyes daily as needed.   Rivaroxaban (XARELTO) 15 MG TABS tablet Take 1 tablet (15 mg total) by mouth daily with supper.    Allergies:   Lisinopril, Mixed grasses, Bee venom, Azithromycin, Baclofen, Iodine, Morphine and codeine, Northern quahog clam (m. mercenaria) skin test, and Sulfa antibiotics   Social History   Socioeconomic History   Marital status: Widowed    Spouse name: Not on file   Number of children: Not on file   Years of education: Not on file   Highest education level: Not on file  Occupational History   Not on file  Tobacco Use   Smoking status: Never   Smokeless tobacco: Never  Vaping Use   Vaping status: Never Used  Substance and Sexual Activity   Alcohol use: Not Currently   Drug use: Never   Sexual activity: Not Currently  Other Topics Concern   Not on file  Social History Narrative   Not on file   Social Determinants of Health   Financial Resource Strain: Not on file  Food Insecurity: Not on file  Transportation Needs: Not on file  Physical Activity: Not on file  Stress: Not on file  Social Connections: Not on file     Family History:  The patient's family history includes Heart disease in her sister; Stroke in her brother. She was adopted.  ROS:   12-point review of systems is negative unless otherwise noted in the HPI.   EKGs/Labs/Other Studies Reviewed:    Studies reviewed were summarized above. The additional studies were reviewed today:  Lexiscan MPI 05/07/2023:   The study is normal. The study is low risk.   No ST deviation was noted.   LV perfusion is normal. There is no evidence of ischemia. There is no evidence of infarction.   Left ventricular function is normal. End diastolic cavity size is normal. End systolic cavity size is normal.   CT attenuation images show evidence of aortic and coronary calcifications. __________  2D echo 10/02/2022: 1. Left  ventricular ejection fraction, by estimation, is 60 to 65%. The  left ventricle has normal function. The left ventricle has no regional  wall motion abnormalities. There is mild left ventricular hypertrophy.  Left ventricular diastolic parameters  are consistent with Grade II diastolic dysfunction (pseudonormalization).  The average left ventricular global longitudinal strain is -8.0 %. The  global longitudinal strain is abnormal.   2. Right ventricular systolic function is normal. The right ventricular  size is normal. There is mildly elevated pulmonary artery systolic  pressure. The estimated right ventricular systolic  pressure is 38.8 mmHg.   3. Left atrial size was moderately dilated.   4. The mitral valve is normal in structure. Mild to moderate mitral valve  regurgitation. No evidence of mitral stenosis.   5. Tricuspid valve regurgitation is mild to moderate.   6. The aortic valve is tricuspid. Aortic valve regurgitation is mild. No  aortic stenosis is present.   7. The inferior vena cava is normal in size with greater than 50%  respiratory variability, suggesting right atrial pressure of 3 mmHg.  __________   2D echo 08/11/2021: 1. Left ventricular ejection fraction, by estimation, is 45 to 50%. The  left ventricle has mildly decreased function. The left ventricle has no  regional wall motion abnormalities. Left ventricular diastolic parameters  are indeterminate.   2. Right ventricular systolic function is normal. The right ventricular  size is normal.   3. Left atrial size was severely dilated.   4. Right atrial size was mildly dilated.   5. The mitral valve is normal in structure. Mild to moderate mitral valve  regurgitation.   6. Tricuspid valve regurgitation is mild to moderate.   7. The aortic valve is tricuspid. Aortic valve regurgitation is not  visualized.   8. The inferior vena cava is normal in size with <50% respiratory  variability, suggesting right atrial  pressure of 8 mmHg.  __________   2D echo 08/20/2018: - Left ventricle: The cavity size was mildly dilated. Wall    thickness was normal. Systolic function was mildly reduced. The    estimated ejection fraction was in the range of 45% to 50%.    Hypokinesis of the inferior myocardium.  - Left atrium: The atrium was mildly dilated.   Impressions:   - Mildly reduced LVF    Inferior Hypokinesis    EF=45-50%    Normal Right side. No cardiac source of emboli was indentified.  __________   Carotid artery ultrasound 08/19/2018: IMPRESSION: No hemodynamically significant stenosis is noted in either cervical carotid artery.   EKG:  EKG is not ordered today.    Recent Labs: 04/12/2023: ALT 6; Hemoglobin 10.9; Platelets 432 04/29/2023: BUN 39; Creatinine, Ser 1.65; Potassium 4.9; Sodium 142  Recent Lipid Panel    Component Value Date/Time   CHOL 165 04/12/2023 0735   TRIG 87 04/12/2023 0735   HDL 57 04/12/2023 0735   CHOLHDL 2.9 04/12/2023 0735   VLDL 21 09/04/2022 1129   LDLCALC 90 04/12/2023 0735   LDLDIRECT 58 09/04/2022 1129    PHYSICAL EXAM:    VS:  BP (!) 148/66 (BP Location: Left Arm, Patient Position: Sitting, Cuff Size: Normal)   Pulse 72   Ht 4\' 11"  (1.499 m)   Wt 151 lb (68.5 kg)   SpO2 97%   BMI 30.50 kg/m   BMI: Body mass index is 30.5 kg/m.  Physical Exam Vitals reviewed.  Constitutional:      Appearance: She is well-developed.  HENT:     Head: Normocephalic and atraumatic.  Eyes:     General:        Right eye: No discharge.        Left eye: No discharge.  Neck:     Vascular: No JVD.  Cardiovascular:     Rate and Rhythm: Normal rate. Rhythm irregularly irregular.     Pulses:          Posterior tibial pulses are 2+ on the right side and 2+ on the left side.     Heart sounds: S1 normal  and S2 normal. Heart sounds not distant. No midsystolic click and no opening snap. Murmur heard.     Systolic murmur is present with a grade of 1/6 at the apex.      No friction rub.  Pulmonary:     Effort: Pulmonary effort is normal. No respiratory distress.     Breath sounds: Normal breath sounds. No decreased breath sounds, wheezing, rhonchi or rales.  Chest:     Chest wall: No tenderness.  Abdominal:     General: There is no distension.  Musculoskeletal:     Cervical back: Normal range of motion.     Right lower leg: No edema.     Left lower leg: No edema.  Skin:    General: Skin is warm and dry.     Nails: There is no clubbing.  Neurological:     Mental Status: She is alert and oriented to person, place, and time.  Psychiatric:        Speech: Speech normal.        Behavior: Behavior normal.        Thought Content: Thought content normal.        Judgment: Judgment normal.     Wt Readings from Last 3 Encounters:  06/30/23 151 lb (68.5 kg)  04/29/23 150 lb 6.4 oz (68.2 kg)  04/08/23 156 lb (70.8 kg)     ASSESSMENT & PLAN:   Orthostatic dizziness: Symptoms overall proved following discontinuation of furosemide and reduction of Toprol-XL to 12.5 mg daily.  Continue with adequate hydration and slow positional changes.  Permanent A-fib: Ventricular rates well-controlled on Toprol-XL 12.5 mg.  CHA2DS2-VASc at least 7 (HTN, age x 2, TIA x 2, vascular disease, sex category).  Remains on renally dosed Xarelto 15 mg with a creatinine clearance of 23.74.  No falls or symptoms concerning for bleeding.  Grade 2 diastolic dysfunction with chronic dyspnea: Continues to note longstanding exertional dyspnea that is unchanged.  Recent Lexiscan MPI showed no evidence of high risk ischemia with echo showing grade 2 diastolic dysfunction and mildly elevated RVSP.  With initiation of furosemide she noted orthostatic dizziness leading to its discontinuation.  No prior history of asthma, patient never smoked.  Low likelihood of pulmonary etiology for her dyspnea.  Recommend furosemide 10 mg daily as needed for increased shortness of breath.  Dyspnea likely  exacerbated by underlying anemia as well.  HTN: Blood pressure is reasonably controlled in the office today.  She remains on Toprol-XL 12.5 mg.  HLD with statin intolerance: LDL 90 in 03/2023 with normal AST/ALT at that time.  Her on rosuvastatin secondary to intolerance with myalgias.  May need to consider alternative lipid-lowering therapy such as bempedoic acid, less likely PCSK9 inhibitor in follow-up.  Mitral regurgitation: Mild to moderate by echo in 09/2022.  Monitor with periodic echo.  History of TIA: No new deficits.  Remains on renally dosed rivaroxaban.  CKD stage IIIb with normocytic anemia: Check BMP and CBC.  Hemoglobin is down approximately 2 g from a baseline last year.  Denies symptoms concerning for bleeding.  If this has not already been evaluated, recommend following up with PCP.  May be contributing to underlying dyspnea.    Disposition: F/u with Dr. Azucena Cecil or an APP in 2 months.   Medication Adjustments/Labs and Tests Ordered: Current medicines are reviewed at length with the patient today.  Concerns regarding medicines are outlined above. Medication changes, Labs and Tests ordered today are summarized above and listed in the Patient  Instructions accessible in Encounters.   Signed, Eula Listen, PA-C 06/30/2023 1:16 PM     [facility] [address] [place], [place] [address] [phone]

## 2023-06-30 ENCOUNTER — Ambulatory Visit: Payer: Medicare Other | Attending: Physician Assistant | Admitting: Physician Assistant

## 2023-06-30 ENCOUNTER — Encounter: Payer: Self-pay | Admitting: Physician Assistant

## 2023-06-30 VITALS — BP 148/66 | HR 72 | Ht 59.0 in | Wt 151.0 lb

## 2023-06-30 DIAGNOSIS — I340 Nonrheumatic mitral (valve) insufficiency: Secondary | ICD-10-CM | POA: Diagnosis present

## 2023-06-30 DIAGNOSIS — I10 Essential (primary) hypertension: Secondary | ICD-10-CM | POA: Diagnosis present

## 2023-06-30 DIAGNOSIS — R42 Dizziness and giddiness: Secondary | ICD-10-CM | POA: Diagnosis present

## 2023-06-30 DIAGNOSIS — N1832 Chronic kidney disease, stage 3b: Secondary | ICD-10-CM | POA: Insufficient documentation

## 2023-06-30 DIAGNOSIS — G459 Transient cerebral ischemic attack, unspecified: Secondary | ICD-10-CM | POA: Diagnosis present

## 2023-06-30 DIAGNOSIS — I4821 Permanent atrial fibrillation: Secondary | ICD-10-CM | POA: Insufficient documentation

## 2023-06-30 DIAGNOSIS — E785 Hyperlipidemia, unspecified: Secondary | ICD-10-CM | POA: Diagnosis present

## 2023-06-30 DIAGNOSIS — I5189 Other ill-defined heart diseases: Secondary | ICD-10-CM | POA: Diagnosis present

## 2023-06-30 DIAGNOSIS — Z789 Other specified health status: Secondary | ICD-10-CM | POA: Insufficient documentation

## 2023-06-30 DIAGNOSIS — D638 Anemia in other chronic diseases classified elsewhere: Secondary | ICD-10-CM | POA: Diagnosis present

## 2023-06-30 MED ORDER — FUROSEMIDE 20 MG PO TABS: 10.0000 mg | ORAL_TABLET | Freq: Every day | ORAL | 3 refills | Status: DC | PRN

## 2023-06-30 NOTE — Patient Instructions (Signed)
Medication Instructions:  Your physician recommends the following medication changes.  START TAKING: Lasix 10 daily as needed for shortness of breath  *If you need a refill on your cardiac medications before your next appointment, please call your pharmacy*   Lab Work: Your provider would like for you to have following labs drawn today CBC and BMP.   If you have labs (blood work) drawn today and your tests are completely normal, you will receive your results only by: MyChart Message (if you have MyChart) OR A paper copy in the mail If you have any lab test that is abnormal or we need to change your treatment, we will call you to review the results.   Follow-Up: At [facility], you and your health needs are our priority.  As part of our continuing mission to provide you with exceptional heart care, we have created designated Provider Care Teams.  These Care Teams include your primary Cardiologist (physician) and Advanced Practice Providers (APPs -  Physician Assistants and Nurse Practitioners) who all work together to provide you with the care you need, when you need it.  We recommend signing up for the patient portal called "MyChart".  Sign up information is provided on this After Visit Summary.  MyChart is used to connect with patients for Virtual Visits (Telemedicine).  Patients are able to view lab/test results, encounter notes, upcoming appointments, etc.  Non-urgent messages can be sent to your provider as well.   To learn more about what you can do with MyChart, go to [email or web address].    Your next appointment:   2 month(s)  Provider:   You may see Debbe Odea, MD or one of the following Advanced Practice Providers on your designated Care Team:   Eula Listen, [place]

## 2023-07-01 ENCOUNTER — Other Ambulatory Visit
Admission: RE | Admit: 2023-07-01 | Discharge: 2023-07-01 | Disposition: A | Discharge status: Home / Self Care | Payer: Medicare Other | Attending: Physician Assistant | Admitting: Physician Assistant

## 2023-07-01 ENCOUNTER — Telehealth: Payer: Self-pay | Admitting: *Deleted

## 2023-07-01 DIAGNOSIS — E875 Hyperkalemia: Secondary | ICD-10-CM | POA: Insufficient documentation

## 2023-07-01 LAB — CBC
Hematocrit: 34.5 % (ref 34.0–46.6)
Hemoglobin: 11.2 g/dL (ref 11.1–15.9)
MCH: 31.5 pg (ref 26.6–33.0)
MCHC: 32.5 g/dL (ref 31.5–35.7)
MCV: 97 fL (ref 79–97)
Platelets: 391 10*3/uL (ref 150–450)
RBC: 3.55 x10E6/uL — ABNORMAL LOW (ref 3.77–5.28)
RDW: 14.3 % (ref 11.7–15.4)
WBC: 6.4 10*3/uL (ref 3.4–10.8)

## 2023-07-01 LAB — POTASSIUM: Potassium: 4.8 mmol/L (ref 3.5–5.1)

## 2023-07-01 LAB — BASIC METABOLIC PANEL
BUN/Creatinine Ratio: 23 (ref 12–28)
BUN: 36 mg/dL (ref 10–36)
CO2: 21 mmol/L (ref 20–29)
Calcium: 10.2 mg/dL (ref 8.7–10.3)
Chloride: 108 mmol/L — ABNORMAL HIGH (ref 96–106)
Creatinine, Ser: 1.54 mg/dL — ABNORMAL HIGH (ref 0.57–1.00)
Glucose: 84 mg/dL (ref 70–99)
Potassium: 5.8 mmol/L — ABNORMAL HIGH (ref 3.5–5.2)
Sodium: 143 mmol/L (ref 134–144)
eGFR: 32 mL/min/{1.73_m2} — ABNORMAL LOW (ref >59)

## 2023-07-01 NOTE — Telephone Encounter (Signed)
-----   Message from Eula Listen sent at 07/01/2023  6:58 AM EST ----- Kidney function elevated and consistent with prior readings Potassium elevated Blood count improved  Recommendations: -Patient is not on any medications that would lead to significant hyperkalemia -Please ensure she is not using any seasonings or salt substitutes that are high in potassium as this can contribute to hyperkalemia -Patient needs to [place] [facility] the medical mall this morning for a stat repeat potassium

## 2023-07-01 NOTE — Telephone Encounter (Signed)
Patient made aware of results and verbalized understanding.  She [place] medical mall today for a STAT potassium. She has been advised that we will call her with the next steps when the results come in.

## 2023-07-01 NOTE — Telephone Encounter (Signed)
The patient has been made aware that her potassium level is 4.8

## 2023-09-03 ENCOUNTER — Emergency Department
Admission: EM | Admit: 2023-09-03 | Discharge: 2023-09-03 | Disposition: A | Discharge status: Home / Self Care | Payer: Medicare Other | Attending: Emergency Medicine | Admitting: Emergency Medicine

## 2023-09-03 ENCOUNTER — Emergency Department: Payer: Medicare Other

## 2023-09-03 ENCOUNTER — Other Ambulatory Visit: Payer: Self-pay

## 2023-09-03 DIAGNOSIS — N189 Chronic kidney disease, unspecified: Secondary | ICD-10-CM | POA: Diagnosis not present

## 2023-09-03 DIAGNOSIS — M542 Cervicalgia: Secondary | ICD-10-CM | POA: Insufficient documentation

## 2023-09-03 DIAGNOSIS — Z7901 Long term (current) use of anticoagulants: Secondary | ICD-10-CM | POA: Insufficient documentation

## 2023-09-03 DIAGNOSIS — I4891 Unspecified atrial fibrillation: Secondary | ICD-10-CM | POA: Diagnosis not present

## 2023-09-03 DIAGNOSIS — Y92000 Kitchen of unspecified non-institutional (private) residence as  the place of occurrence of the external cause: Secondary | ICD-10-CM | POA: Insufficient documentation

## 2023-09-03 DIAGNOSIS — M25552 Pain in left hip: Secondary | ICD-10-CM | POA: Diagnosis present

## 2023-09-03 DIAGNOSIS — W01198A Fall on same level from slipping, tripping and stumbling with subsequent striking against other object, initial encounter: Secondary | ICD-10-CM | POA: Diagnosis not present

## 2023-09-03 DIAGNOSIS — I672 Cerebral atherosclerosis: Secondary | ICD-10-CM | POA: Insufficient documentation

## 2023-09-03 DIAGNOSIS — W19XXXA Unspecified fall, initial encounter: Secondary | ICD-10-CM

## 2023-09-03 DIAGNOSIS — I129 Hypertensive chronic kidney disease with stage 1 through stage 4 chronic kidney disease, or unspecified chronic kidney disease: Secondary | ICD-10-CM | POA: Diagnosis not present

## 2023-09-03 NOTE — ED Provider Notes (Signed)
 [facility] Provider Note    Event Date/Time   First MD Initiated Contact with Patient 09/03/23 1241     (approximate)   History   Fall   HPI  Brittney [name] is a 89 y.o. female with PMH of HTN, CKD, A-fib on Xarelto  presents for evaluation after a fall.  Patient went to sit down in one of the chairs at her kitchen [employer] when it slid out from underneath her.  She landed on her bottom and hit the back of her head against the chair.  She is having some left hip pain but denies headache and vision changes.     Physical Exam   Triage Vital Signs: ED Triage Vitals  Encounter Vitals Group     BP 09/03/23 1059 (!) 145/77     Systolic BP Percentile --      Diastolic BP Percentile --      Pulse Rate 09/03/23 1100 76     Resp 09/03/23 1059 16     Temp 09/03/23 1059 97.8 F (36.6 C)     Temp src --      SpO2 09/03/23 1100 96 %     Weight 09/03/23 1100 150 lb (68 kg)     Height 09/03/23 1100 4' 11 (1.499 m)     Head Circumference --      Peak Flow --      Pain Score 09/03/23 1059 6     Pain Loc --      Pain Education --      Exclude from Growth Chart --     Most recent vital signs: Vitals:   09/03/23 1059 09/03/23 1100  BP: (!) 145/77   Pulse:  76  Resp: 16   Temp: 97.8 F (36.6 C)   SpO2:  96%    General: Awake, no distress.  CV:  Good peripheral perfusion.  RRR. Resp:  Normal effort.  CTAB. Abd:  No distention.  Other:  PERRL, EOM intact, no focal neurodeficits.   ED Results / Procedures / Treatments   Labs (all labs ordered are listed, but only abnormal results are displayed) Labs Reviewed - No data to display  RADIOLOGY  CT of the head and neck obtained, interpreted the images as well as reviewed the radiologist report which was negative for any acute abnormalities.   PROCEDURES:  Critical Care performed: No  Procedures   MEDICATIONS ORDERED IN ED: Medications - No data to display   IMPRESSION / MDM / ASSESSMENT  AND PLAN / ED COURSE  I reviewed the triage vital signs and the nursing notes.                             89 year old female presents for evaluation after a fall.  Patient was hypertensive in triage but does have history of hypertension.  No signs stable otherwise.  Patient NAD on exam.  Differential diagnosis includes, but is not limited to, concussion, contusion, intracranial bleed, hip fracture, hip dislocation, muscle strain.  Patient's presentation is most consistent with acute complicated illness / injury requiring diagnostic workup.  CT of the head and neck were both negative.  I did offer patient an x-ray of the left hip as she is having pain there but she declined at this time.  She says that the pain is only mild.  We discussed taking Tylenol  as needed for pain.  Physical exam is reassuring and I feel patient is  stable for discharge.  All questions were answered and she was excited to be going home.      FINAL CLINICAL IMPRESSION(S) / ED DIAGNOSES   Final diagnoses:  Fall, initial encounter     Rx / DC Orders   ED Discharge Orders     None        Note:  This document was prepared using Dragon voice recognition software and may include unintentional dictation errors.   Cleaster Tinnie LABOR, PA-C 09/03/23 1254    Viviann Pastor, MD 09/03/23 1446

## 2023-09-03 NOTE — Discharge Instructions (Signed)
 CT of your head and neck were normal today.  If your hip continues to bother you you can return to the ED at any time to have an x-ray.  I recommend taking Tylenol  as needed for pain.  You can also try ice, heat as well as topical pain relievers like Salonpas patches, Bengay cream, IcyHot or Tiger balm.

## 2023-09-03 NOTE — ED Triage Notes (Signed)
 Pt to ED for fall today, reports was trying to sit down in a chair that had wheels on it and it slipped under her. +hit head, +blood thinners, denies LOC.  C/o pain to left hip. Pt declines xray of hip.

## 2023-09-06 ENCOUNTER — Telehealth: Payer: Self-pay | Admitting: Emergency Medicine

## 2023-09-06 ENCOUNTER — Ambulatory Visit: Payer: Medicare Other | Attending: Physician Assistant | Admitting: Physician Assistant

## 2023-09-06 ENCOUNTER — Encounter: Payer: Self-pay | Admitting: Physician Assistant

## 2023-09-06 VITALS — BP 148/77 | HR 79 | Ht 59.0 in | Wt 149.4 lb

## 2023-09-06 DIAGNOSIS — I4821 Permanent atrial fibrillation: Secondary | ICD-10-CM | POA: Diagnosis not present

## 2023-09-06 DIAGNOSIS — Z789 Other specified health status: Secondary | ICD-10-CM | POA: Diagnosis present

## 2023-09-06 DIAGNOSIS — E785 Hyperlipidemia, unspecified: Secondary | ICD-10-CM | POA: Insufficient documentation

## 2023-09-06 DIAGNOSIS — I5189 Other ill-defined heart diseases: Secondary | ICD-10-CM | POA: Diagnosis not present

## 2023-09-06 DIAGNOSIS — D638 Anemia in other chronic diseases classified elsewhere: Secondary | ICD-10-CM | POA: Diagnosis present

## 2023-09-06 DIAGNOSIS — I10 Essential (primary) hypertension: Secondary | ICD-10-CM | POA: Diagnosis present

## 2023-09-06 DIAGNOSIS — N1832 Chronic kidney disease, stage 3b: Secondary | ICD-10-CM | POA: Diagnosis present

## 2023-09-06 DIAGNOSIS — R42 Dizziness and giddiness: Secondary | ICD-10-CM | POA: Diagnosis not present

## 2023-09-06 DIAGNOSIS — I340 Nonrheumatic mitral (valve) insufficiency: Secondary | ICD-10-CM | POA: Diagnosis present

## 2023-09-06 DIAGNOSIS — G459 Transient cerebral ischemic attack, unspecified: Secondary | ICD-10-CM | POA: Insufficient documentation

## 2023-09-06 NOTE — Patient Instructions (Signed)
 Medication Instructions:  Your Physician recommend you continue on your current medication as directed.    *If you need a refill on your cardiac medications before your next appointment, please call your pharmacy*   Lab Work: Your provider would like for you to have following labs drawn today BMeT.   If you have labs (blood work) drawn today and your tests are completely normal, you will receive your results only by: MyChart Message (if you have MyChart) OR A paper copy in the mail If you have any lab test that is abnormal or we need to change your treatment, we will call you to review the results.   Testing/Procedures: None ordered at this time    Follow-Up: At [facility], you and your health needs are our priority.  As part of our continuing mission to provide you with exceptional heart care, we have created designated Provider Care Teams.  These Care Teams include your primary Cardiologist (physician) and Advanced Practice Providers (APPs -  Physician Assistants and Nurse Practitioners) who all work together to provide you with the care you need, when you need it.  We recommend signing up for the patient portal called MyChart.  Sign up information is provided on this After Visit Summary.  MyChart is used to connect with patients for Virtual Visits (Telemedicine).  Patients are able to view lab/test results, encounter notes, upcoming appointments, etc.  Non-urgent messages can be sent to your provider as well.   To learn more about what you can do with MyChart, go to [email or web address].    Your next appointment:   6 month(s)  Provider:   You may see Redell Cave, MD or one of the following Advanced Practice Providers on your designated Care Team:   Bernardino Bring, PA-C

## 2023-09-06 NOTE — Progress Notes (Signed)
 Cardiology Office Note    Date:  09/06/2023   ID:  Brittney, [name] 01/09/1930, MRN [id number]  PCP:  Brittney Harlene POUR, NP  Cardiologist:  Brittney Cave, MD  Electrophysiologist:  None   Chief Complaint: Follow up  History of Present Illness:   Brittney [name] is a 89 y.o. female with history of permanent A-fib, systolic dysfunction, TIA, CKD stage III, HTN, HLD, and lumbar radiculitis who presents for follow-up of A-fib.   She was previously followed by [facility], and underwent cardiac cath in 2008 which showed no angiographically apparent CAD in the major epicardial arteries.  Calcium  score in 2013 of 129.73.  Nuclear stress test in 2015 showed no evidence of ischemia with normal LV function.  She was admitted to the hospital in 2019 with a suspected TIA.  Carotid artery ultrasound at that time showed no hemodynamically significant stenosis in the bilateral carotid arteries with antegrade flow of the bilateral vertebral arteries.  Echo demonstrated an EF of 45 to 50% with hypokinesis of the inferior myocardium and a mildly dilated left atrium.  Notes indicate she was diagnosed with A-fib in 10/2020, and initiated on apixaban .  She established care with our group in 2022.  Subsequent echo in 07/2021 showed a stable LV systolic function with an EF of 45 to 50%, no regional wall motion abnormalities, indeterminate LV diastolic function parameters, normal RV systolic function and ventricular cavity size, severely dilated left atrium, mildly dilated right atrium, mild to moderate mitral regurgitation, mild to moderate tricuspid regurgitation, and an estimated right atrial pressure of 8 mmHg.  She was seen in our office in 08/2021 and reported a possible TIA several weeks prior.  She was initiated on aspirin  with continuation of apixaban  and referred to neurology with recommendation to be maintained on apixaban  and aspirin .  MRI of the brain showed no evidence of acute intracranial  abnormality with mild to moderate chronic small vessel ischemic changes that had progressed from study in 2019 as well as mild generalized cerebral atrophy.  She was seen in the office in 08/2022 noting a several month history of exertional shortness of breath when walking up an incline without frank chest pain.  Symptoms were largely unchanged when compared to her prior visit and would last for a couple of seconds with spontaneous resolution.  She also wondered if rosuvastatin  was contributing to some of her lower extremity discomfort.  In this setting, she underwent echo in 09/2022 which showed an EF of 60 to 65%, no regional wall motion abnormalities, mild LVH, grade 2 diastolic dysfunction, normal RV systolic function and ventricular cavity size, mildly elevated RVSP at 38.8 mmHg, moderately dilated left atrium, mild to moderate mitral regurgitation, mild to moderate tricuspid regurgitation, mild aortic insufficiency, and an estimated right atrial pressure of 3 mmHg.   She was evaluated at her PCPs office on 04/08/2023 noting an increase in fatigue and dyspnea with associated chest discomfort.  Reported ankle swelling at the end of the day.  Chest x-ray was obtained which demonstrated vascular congestion.  In this setting, primary cardiologist initiated the patient on furosemide  20 mg daily.  However, patient subsequently notified our office that she was experiencing dizziness with initiation of furosemide .  With this, furosemide  was held on 04/28/2023.   She was seen in the office on 04/29/2023 noting an increase in dizziness following initiation of furosemide .  She felt like her breathing was overall stable with continued shortness of breath and chest heaviness with ambulation that was  longstanding.  There was no significant lower extremity swelling.  Toprol  was reduced to 12.5 mg daily with recommendation to continue to hold furosemide .  To further evaluate her longstanding exertional dyspnea, she underwent  Lexiscan  MPI on 05/07/2023 that showed no evidence of ischemia or infarction with normal LV systolic function and was overall low risk.  CT attenuated corrected images demonstrated coronary artery calcification and aortic atherosclerosis.  She was last seen in the office on 06/30/2023 noting unchanged chronic dyspnea.  Orthostatic dizziness improved following reduction of Toprol -XL and discontinuation of scheduled furosemide .   She was seen in the ED on 09/03/2023 after sustaining a mechanical fall, attempting to sit down at her kitchen [place] when the chair slid out from underneath her.  CTh showed no acute intracranial process.  CT cervical spine showed no acute fracture or traumatic finding.  She comes in doing well from a cardiac perspective and is without symptoms of angina or cardiac decompensation.  She feels like her chronic dyspnea may be a little improved, as she is able to walk her dog with less shortness of breath.  No palpitations, dizziness, presyncope, or syncope.  She has been taking furosemide  10 mg once daily since her last visit in November.  She does notice some mild lower extremity swelling that is more progressive throughout the day.  No symptoms concerning for bleeding.  Weight stable.  No longer taking rosuvastatin  as she indicates this has led to myalgias despite with several rechallenge's.  Off rosuvastatin , she notes an improvement in lower extremity myalgias, though not resolution.  Overall, she feels like she is doing well.   Labs independently reviewed: 06/2023 - potassium 4.8, BUN 36, serum creatinine 1.54, Hgb 11.2, PLT 391 03/2023 - albumin 4.0, AST/ALT normal, TC 165, TG 87, HDL 57, LDL 90 08/2021 - TSH normal   Past Medical History:  Diagnosis Date   Adopted person    Allergy    Anemia    Angina pectoris (HCC)    Aortic atherosclerosis (HCC)    Arthritis    Atrial fibrillation (HCC)    a. ) CHA2DS2-VASc = 7 (age x 2, sex, HTN, TIA x2, aortic plaque);  b.)  rate/rhythm maintained on oral metoprolol  succinate; chronically anticoagulated using dose reduced apixaban .   Bilateral carotid artery disease (HCC)    CAD (coronary artery disease)    a.) LHC 02/09/2007: EF 60%, LVEDP 25 mmHg, normal cors; b.) cCTA 11/27/2011: Ca score 129.73 --> 1.17 RCA, 16.36 LM, 112.21 LAD, 0 LCx; c.) MPI 03/13/2014 - normal.   Cataract    CKD (chronic kidney disease), stage IV (HCC)    Diastolic dysfunction 02/09/2008   a.) LHC 02/09/2008: EF 60%, LVEDP 25 mmHg; b.) TTE 08/07/2010: EF 65%, mild LVH, mild MR, G2DD; b.) TTE 08/20/2018: EF 45-50%, inferior HK, mild LAE; c.) TTE 08/11/2021: EF 45-50%. 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   Left carotid bruit    Long term current use of anticoagulant    a.) apixaban    Lumbar stenosis    Lymphocytic colitis    Osteoporosis    TIA (transient ischemic attack) 07/2018   TIA (transient ischemic attack) 08/2021   Vitamin B12 deficiency    Wears hearing BILATERAL aids    Wears partial (bottom) dentures  Past Surgical History:  Procedure Laterality Date   BREAST EXCISIONAL BIOPSY Right 36 years ago   neg   BUNIONECTOMY Bilateral    COLONOSCOPY N/A 07/02/2008   KNEE ARTHROPLASTY Right 03/11/2022   Procedure: COMPUTER ASSISTED TOTAL KNEE ARTHROPLASTY;  Surgeon: Mardee Lynwood SQUIBB, MD;  Location: [facility] ORS;  Service: Orthopedics;  Laterality: Right;   KNEE ARTHROSCOPY Right 2006   LEFT HEART CATH AND CORONARY ANGIOGRAPHY Left 02/09/2007   Procedure: LEFT HEART CATH AND CORONARY ANGIOGRAPHY; Location: [facility]   TONSILLECTOMY AND ADENOIDECTOMY Bilateral    TOTAL ABDOMINAL HYSTERECTOMY W/ BILATERAL SALPINGOOPHORECTOMY N/A    TOTAL KNEE ARTHROPLASTY Left 04/26/2014   TUBAL LIGATION      Current Medications: Current Meds  Medication Sig    acetaminophen  (TYLENOL ) 500 MG tablet Take 1,000 mg by mouth 2 (two) times daily as needed.   Cholecalciferol  (VITAMIN D ) 50 MCG (2000 UT) tablet Take 2,000 Units by mouth daily.   cyanocobalamin  (VITAMIN B12) 1000 MCG tablet Take 1,000 mcg by mouth 3 (three) times a week.   EPINEPHrine  0.3 mg/0.3 mL IJ SOAJ injection Inject 0.3 mg into the muscle as needed for anaphylaxis.   fluticasone  (FLONASE ) 50 MCG/ACT nasal spray Place 2 sprays into both nostrils as needed.   furosemide  (LASIX ) 20 MG tablet Take 0.5 tablets (10 mg total) by mouth daily as needed (shortness of breath). (Patient taking differently: Take 10 mg by mouth daily.)   loratadine  (CLARITIN ) 10 MG tablet Take 10 mg by mouth daily.   metoprolol  succinate (TOPROL -XL) 25 MG 24 hr tablet Take 0.5 tablets (12.5 mg total) by mouth daily.   Olopatadine  HCl (PAZEO) 0.7 % SOLN Place 1 drop into both eyes daily as needed.   Rivaroxaban  (XARELTO ) 15 MG TABS tablet Take 1 tablet (15 mg total) by mouth daily with supper.    Allergies:   Lisinopril, Mixed grasses, Bee venom, Azithromycin, Baclofen, Iodine, Morphine  and codeine, Northern quahog clam (m. mercenaria) skin test, and Sulfa antibiotics   Social History   Socioeconomic History   Marital status: Widowed    Spouse name: Not on file   Number of children: Not on file   Years of education: Not on file   Highest education level: Not on file  Occupational History   Not on file  Tobacco Use   Smoking status: Never   Smokeless tobacco: Never  Vaping Use   Vaping status: Never Used  Substance and Sexual Activity   Alcohol use: Not Currently   Drug use: Never   Sexual activity: Not Currently  Other Topics Concern   Not on file  Social History Narrative   Not on file   Social Drivers of Health   Financial Resource Strain: Not on file  Food Insecurity: Not on file  Transportation Needs: Not on file  Physical Activity: Not on file  Stress: Not on file  Social Connections: Not  on file     Family History:  The patient's family history includes Heart disease in her sister; Stroke in her brother. She was adopted.  ROS:   12-point review of systems is negative unless otherwise noted in the HPI.   EKGs/Labs/Other Studies Reviewed:    Studies reviewed were summarized above. The additional studies were reviewed today:  Lexiscan  MPI 05/07/2023:   The study is normal. The study is low risk.   No ST deviation was noted.   LV perfusion is normal. There is no evidence of ischemia. There is no evidence of infarction.  Left ventricular function is normal. End diastolic cavity size is normal. End systolic cavity size is normal.   CT attenuation images show evidence of aortic and coronary calcifications. __________   2D echo 10/02/2022: 1. Left ventricular ejection fraction, by estimation, is 60 to 65%. The  left ventricle has normal function. The left ventricle has no regional  wall motion abnormalities. There is mild left ventricular hypertrophy.  Left ventricular diastolic parameters  are consistent with Grade II diastolic dysfunction (pseudonormalization).  The average left ventricular global longitudinal strain is -8.0 %. The  global longitudinal strain is abnormal.   2. Right ventricular systolic function is normal. The right ventricular  size is normal. There is mildly elevated pulmonary artery systolic  pressure. The estimated right ventricular systolic pressure is 38.8 mmHg.   3. Left atrial size was moderately dilated.   4. The mitral valve is normal in structure. Mild to moderate mitral valve  regurgitation. No evidence of mitral stenosis.   5. Tricuspid valve regurgitation is mild to moderate.   6. The aortic valve is tricuspid. Aortic valve regurgitation is mild. No  aortic stenosis is present.   7. The inferior vena cava is normal in size with greater than 50%  respiratory variability, suggesting right atrial pressure of 3 mmHg.  __________   2D echo  08/11/2021: 1. Left ventricular ejection fraction, by estimation, is 45 to 50%. The  left ventricle has mildly decreased function. The left ventricle has no  regional wall motion abnormalities. Left ventricular diastolic parameters  are indeterminate.   2. Right ventricular systolic function is normal. The right ventricular  size is normal.   3. Left atrial size was severely dilated.   4. Right atrial size was mildly dilated.   5. The mitral valve is normal in structure. Mild to moderate mitral valve  regurgitation.   6. Tricuspid valve regurgitation is mild to moderate.   7. The aortic valve is tricuspid. Aortic valve regurgitation is not  visualized.   8. The inferior vena cava is normal in size with <50% respiratory  variability, suggesting right atrial pressure of 8 mmHg.  __________   2D echo 08/20/2018: - Left ventricle: The cavity size was mildly dilated. Wall    thickness was normal. Systolic function was mildly reduced. The    estimated ejection fraction was in the range of 45% to 50%.    Hypokinesis of the inferior myocardium.  - Left atrium: The atrium was mildly dilated.   Impressions:   - Mildly reduced LVF    Inferior Hypokinesis    EF=45-50%    Normal Right side. No cardiac source of emboli was indentified.  __________   Carotid artery ultrasound 08/19/2018: IMPRESSION: No hemodynamically significant stenosis is noted in either cervical carotid artery.   EKG:  EKG is not ordered today.    Recent Labs: 04/12/2023: ALT 6 06/30/2023: BUN 36; Creatinine, Ser 1.54; Hemoglobin 11.2; Platelets 391; Sodium 143 07/01/2023: Potassium 4.8  Recent Lipid Panel    Component Value Date/Time   CHOL 165 04/12/2023 0735   TRIG 87 04/12/2023 0735   HDL 57 04/12/2023 0735   CHOLHDL 2.9 04/12/2023 0735   VLDL 21 09/04/2022 1129   LDLCALC 90 04/12/2023 0735   LDLDIRECT 58 09/04/2022 1129    PHYSICAL EXAM:    VS:  BP (!) 148/77 (BP Location: Left Arm, Patient Position:  Sitting, Cuff Size: Normal)   Pulse 79   Ht 4' 11 (1.499 m)   Wt 149 lb 6.4 oz (  67.8 kg)   SpO2 93%   BMI 30.18 kg/m   BMI: Body mass index is 30.18 kg/m.  Physical Exam Vitals reviewed.  Constitutional:      Appearance: She is well-developed.  HENT:     Head: Normocephalic and atraumatic.  Eyes:     General:        Right eye: No discharge.        Left eye: No discharge.  Neck:     Vascular: No JVD.  Cardiovascular:     Rate and Rhythm: Normal rate. Rhythm irregularly irregular.     Heart sounds: S1 normal and S2 normal. Heart sounds not distant. No midsystolic click and no opening snap. Murmur heard.     Systolic murmur is present with a grade of 1/6 at the upper right sternal border.     No friction rub.  Pulmonary:     Effort: Pulmonary effort is normal. No respiratory distress.     Breath sounds: Normal breath sounds. No decreased breath sounds, wheezing, rhonchi or rales.  Chest:     Chest wall: No tenderness.  Abdominal:     General: There is no distension.  Musculoskeletal:     Cervical back: Normal range of motion.     Right lower leg: No edema.     Left lower leg: No edema.  Skin:    General: Skin is warm and dry.     Nails: There is no clubbing.  Neurological:     Mental Status: She is alert and oriented to person, place, and time.  Psychiatric:        Speech: Speech normal.        Behavior: Behavior normal.        Thought Content: Thought content normal.        Judgment: Judgment normal.     Wt Readings from Last 3 Encounters:  09/06/23 149 lb 6.4 oz (67.8 kg)  09/03/23 150 lb (68 kg)  06/30/23 151 lb (68.5 kg)     ASSESSMENT & PLAN:   Permanent A-fib: Well-controlled ventricular response on Toprol -XL 12.5 mg daily.  CHA2DS2-VASc at least 7 (HTN, age x 2, TIA x 2, vascular disease, sex category).  She remains on rivaroxaban  15 mg.  CrCl 25.  No symptoms concerning for bleeding.  Grade 2 diastolic dysfunction with chronic dyspnea: Longstanding  chronic dyspnea is stable to slightly improved.  Recent Lexiscan  MPI showed no evidence of high risk ischemia with echo showing grade 2 diastolic dysfunction and mildly elevated RVSP.  She has been taking furosemide  10 mg daily since her visit in November 2024.  Check BMP to ensure stable renal function.  Defer addition of MRA given underlying renal dysfunction.  Defer addition of SGLT2 inhibitor given concern for off target effect.  Orthostatic dizziness: Symptoms overall improved following reduction of furosemide  and Toprol -XL.  Maintain adequate hydration with slow positional changes.  HTN: Blood pressure is reasonably controlled in the office today given history of orthostasis.  She remains on Toprol -XL 12.5 mg.  HLD with statin intolerance: LDL 90 in 03/2023.  No longer on statin secondary to myalgia.  May need to consider bempedoic acid versus PCSK9 inhibitor in follow-up.  Mitral regurgitation: Mild to moderate by echo in 09/2022.  Will monitor with periodic echo.  History of TIA: No new deficits.  Remains on renally dosed rivaroxaban .  CKD stage IIIb with normocytic anemia: Check BMP.  Most recent CBC showed stable to improved hemoglobin.  No symptoms concerning for bleeding.  Disposition: F/u with Dr. Darliss or an APP in 6 months.   Medication Adjustments/Labs and Tests Ordered: Current medicines are reviewed at length with the patient today.  Concerns regarding medicines are outlined above. Medication changes, Labs and Tests ordered today are summarized above and listed in the Patient Instructions accessible in Encounters.   Signed, Bernardino Bring, PA-C 09/06/2023 4:23 PM     [facility] [place] [address] [place], [place] [address] [phone]

## 2023-09-06 NOTE — Telephone Encounter (Signed)
 Please call pt: she is requesting assistance for medicine.  She's 89 and paying over $575 per month for Xarelto and that was a 30 day script!   Thank you, Pam!

## 2023-09-07 ENCOUNTER — Other Ambulatory Visit: Payer: Self-pay | Admitting: Emergency Medicine

## 2023-09-07 LAB — BASIC METABOLIC PANEL
BUN/Creatinine Ratio: 22 (ref 12–28)
BUN: 34 mg/dL (ref 10–36)
CO2: 24 mmol/L (ref 20–29)
Calcium: 9.8 mg/dL (ref 8.7–10.3)
Chloride: 105 mmol/L (ref 96–106)
Creatinine, Ser: 1.54 mg/dL — ABNORMAL HIGH (ref 0.57–1.00)
Glucose: 99 mg/dL (ref 70–99)
Potassium: 4.9 mmol/L (ref 3.5–5.2)
Sodium: 143 mmol/L (ref 134–144)
eGFR: 32 mL/min/{1.73_m2} — ABNORMAL LOW (ref >59)

## 2023-09-07 MED ORDER — FUROSEMIDE 20 MG PO TABS: 10.0000 mg | ORAL_TABLET | Freq: Every day | ORAL | 3 refills | Status: DC | PRN

## 2023-09-13 NOTE — Telephone Encounter (Signed)
Left voicemail message to call back  

## 2023-09-16 NOTE — Telephone Encounter (Signed)
Left voicemail message to call back  

## 2023-09-16 NOTE — Telephone Encounter (Signed)
Spoke with patient and discussed options for assistance. In discussing this we were trying to calculate what her 3% would be. She makes $200,000 a year so she would not qualify for any assistance at this time. She was thankful for the return call and had no further needs.

## 2023-10-05 ENCOUNTER — Other Ambulatory Visit: Payer: Self-pay | Admitting: Physician Assistant

## 2023-10-05 NOTE — Telephone Encounter (Signed)
Refill request

## 2023-10-05 NOTE — Telephone Encounter (Signed)
Prescription refill request for Xarelto received.  Indication:afib Last office visit:1/25 Weight:67.8  kg Age:89 Scr:1.54  1/25 CrCl:25.47  ml/min  Prescription refilled

## 2023-10-14 ENCOUNTER — Ambulatory Visit: Payer: Medicare Other | Admitting: Nurse Practitioner

## 2023-10-21 ENCOUNTER — Encounter: Payer: Self-pay | Admitting: Nurse Practitioner

## 2023-10-21 ENCOUNTER — Ambulatory Visit: Payer: Medicare Other | Admitting: Nurse Practitioner

## 2023-10-21 VITALS — BP 126/78 | HR 88 | Temp 96.1°F | Ht 59.0 in | Wt 152.0 lb

## 2023-10-21 DIAGNOSIS — N898 Other specified noninflammatory disorders of vagina: Secondary | ICD-10-CM

## 2023-10-21 DIAGNOSIS — I4821 Permanent atrial fibrillation: Secondary | ICD-10-CM

## 2023-10-21 DIAGNOSIS — N1831 Chronic kidney disease, stage 3a: Secondary | ICD-10-CM

## 2023-10-21 DIAGNOSIS — R198 Other specified symptoms and signs involving the digestive system and abdomen: Secondary | ICD-10-CM | POA: Diagnosis not present

## 2023-10-21 DIAGNOSIS — I25118 Atherosclerotic heart disease of native coronary artery with other forms of angina pectoris: Secondary | ICD-10-CM

## 2023-10-21 DIAGNOSIS — D6869 Other thrombophilia: Secondary | ICD-10-CM | POA: Diagnosis not present

## 2023-10-21 NOTE — Progress Notes (Signed)
 Careteam: Patient Care Team: [name], NP as PCP - General (Geriatric Medicine) Debbe Odea, MD as PCP - Cardiology (Cardiology) Domingo Madeira, OD (Optometry) PLACE OF SERVICE:  [facility]   Advanced Directive information    Allergies  Allergen Reactions   Lisinopril Swelling   Mixed Grasses Anaphylaxis   Bee Venom Swelling   Azithromycin Diarrhea   Baclofen Diarrhea   Iodine     CKD   Morphine And Codeine Nausea And Vomiting   [place] Clam (M. Mercenaria) Skin Test Diarrhea    Mussels.   Sulfa Antibiotics Rash    Chief Complaint  Patient presents with   Medical Management of Chronic Issues    Medical Management of Chronic Issues. Complains of being tired all the time. To discuss need for Covid and Zoster.      HPI: Patient is a 89 y.o. female seen in today at [facility]  Discussed the use of AI scribe software for clinical note transcription with the patient, who gave verbal consent to proceed.  History of Present Illness   Brittney [name] "Brittney [name]" is a 89 year old female with atrial fibrillation and chronic kidney disease who presents for a six-month follow-up.  She experiences gastrointestinal issues, specifically diarrhea, approximately every one to two weeks. These episodes are described as 'explosions' and sometimes result in not reaching the bathroom in time. Bowel movements vary in consistency between solid and loose. She is hesitant to use MiraLAX due to concerns about worsening diarrhea but has Benefiber available, which she has not yet started. She suspects chocolate and possibly lactose intolerance, as she had in her forties, might be contributing to her symptoms. She typically consumes an ice cream popsicle at night.  She experiences itching on the outside of her vaginal area, described as 'enough to drive you crazy,' with no associated vaginal discharge. She uses a vaginal lubricant but not consistently and is not interested  in using estrogen therapy.  She has a history of atrial fibrillation with associated systolic dysfunction. She is on metoprolol and Xarelto for management. She reports occasional palpitations and mild dyspnea on exertion, which resolves with rest. She has mild to moderate mitral regurgitation noted on an echocardiogram from 2024. She reports easy bruising but no serious bleeding issues. She occasionally experiences palpitations and slight breathing difficulty when walking up inclines, which improves upon reaching the top. She takes Lasix every other day, half a pill, as needed for breathing issues.  She has stable chronic kidney disease with recent lab work showing stable kidney function and normal potassium levels. She has a history of statin intolerance due to muscle tightness and does not take rosuvastatin.  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         Review of Systems:  Review of Systems  Constitutional:  Negative for chills, fever and weight loss.  HENT:  Negative for tinnitus.   Respiratory:  Negative for cough, sputum production and shortness of breath.   Cardiovascular:  Negative for chest pain, palpitations and leg swelling.  Gastrointestinal:  Positive for diarrhea. Negative for abdominal pain, constipation and heartburn.  Genitourinary:  Negative for dysuria, frequency and urgency.  Musculoskeletal:  Negative for back pain, falls, joint pain and myalgias.  Skin:  Positive for itching.  Neurological:  Negative for dizziness and headaches.  Psychiatric/Behavioral:  Negative for depression and memory loss. The patient does not have insomnia.  Past Medical History:  Diagnosis Date   Adopted person    Allergy    Anemia    Angina pectoris (HCC)    Aortic atherosclerosis (HCC)    Arthritis    Atrial fibrillation (HCC)    a. ) CHA2DS2-VASc = 7 (age x 2, sex, HTN, TIA x2, aortic plaque);  b.) rate/rhythm maintained on  oral metoprolol succinate; chronically anticoagulated using dose reduced apixaban.   Bilateral carotid artery disease (HCC)    CAD (coronary artery disease)    a.) LHC 02/09/2007: EF 60%, LVEDP 25 mmHg, normal cors; b.) cCTA 11/27/2011: Ca score 129.73 --> 1.17 RCA, 16.36 LM, 112.21 LAD, 0 LCx; c.) MPI 03/13/2014 - normal.   Cataract    CKD (chronic kidney disease), stage IV (HCC)    Diastolic dysfunction 02/09/2008   a.) LHC 02/09/2008: EF 60%, LVEDP 25 mmHg; b.) TTE 08/07/2010: EF 65%, mild LVH, mild MR, G2DD; b.) TTE 08/20/2018: EF 45-50%, inferior HK, mild LAE; c.) TTE 08/11/2021: EF 45-50%. 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   Left carotid bruit    Long term current use of anticoagulant    a.) apixaban   Lumbar stenosis    Lymphocytic colitis    Osteoporosis    TIA (transient ischemic attack) 07/2018   TIA (transient ischemic attack) 08/2021   Vitamin B12 deficiency    Wears hearing BILATERAL aids    Wears partial (bottom) dentures    Past Surgical History:  Procedure Laterality Date   BREAST EXCISIONAL BIOPSY Right 36 years ago   neg   BUNIONECTOMY Bilateral    COLONOSCOPY N/A 07/02/2008   KNEE ARTHROPLASTY Right 03/11/2022   Procedure: COMPUTER ASSISTED TOTAL KNEE ARTHROPLASTY;  Surgeon: Donato Heinz, MD;  Location: [facility];  Service: Orthopedics;  Laterality: Right;   KNEE ARTHROSCOPY Right 2006   LEFT HEART CATH AND CORONARY ANGIOGRAPHY Left 02/09/2007   Procedure: LEFT HEART CATH AND CORONARY ANGIOGRAPHY; Location: [facility]   TONSILLECTOMY AND ADENOIDECTOMY Bilateral    TOTAL ABDOMINAL HYSTERECTOMY W/ BILATERAL SALPINGOOPHORECTOMY N/A    TOTAL KNEE ARTHROPLASTY Left 04/26/2014   TUBAL LIGATION     Social History:   reports that she has never smoked. She has never used smokeless  tobacco. She reports that she does not currently use alcohol. She reports that she does not use drugs.  Family History  Adopted: Yes  Problem Relation Age of Onset   Heart disease Sister    Stroke Brother     Medications: Patient's Medications  New Prescriptions   No medications on file  Previous Medications   ACETAMINOPHEN (TYLENOL) 500 MG TABLET    Take 1,000 mg by mouth 2 (two) times daily as needed.   CHOLECALCIFEROL (VITAMIN D) 50 MCG (2000 UT) TABLET    Take 2,000 Units by mouth daily.   CYANOCOBALAMIN (VITAMIN B12) 1000 MCG TABLET    Take 1,000 mcg by mouth 3 (three) times a week.   EPINEPHRINE 0.3 MG/0.3 ML IJ SOAJ INJECTION    Inject 0.3 mg into the muscle as needed for anaphylaxis.   FLUTICASONE (FLONASE) 50 MCG/ACT NASAL SPRAY    Place 2 sprays into both nostrils as needed.   FUROSEMIDE (LASIX) 20 MG TABLET    Take 0.5 tablets (10 mg total) by mouth daily as  needed (shortness of breath, or swelling, or daily weight gain greater than 3 pounds).   LORATADINE (CLARITIN) 10 MG TABLET    Take 10 mg by mouth daily.   METOPROLOL SUCCINATE (TOPROL-XL) 25 MG 24 HR TABLET    Take 0.5 tablets (12.5 mg total) by mouth daily.   OLOPATADINE HCL (PAZEO) 0.7 % SOLN    Place 1 drop into both eyes daily as needed.   XARELTO 15 MG TABS TABLET    TAKE ONE TABLET BY MOUTH EVERY DAY WITH SUPPER  Modified Medications   No medications on file  Discontinued Medications   GUAIFENESIN (MUCINEX) 600 MG 12 HR TABLET    Take 1 tablet (600 mg total) by mouth 2 (two) times daily.    Physical Exam:  Vitals:   10/21/23 1022  BP: 126/78  Pulse: 88  Temp: (!) 96.1 F (35.6 C)  SpO2: 100%  Weight: 152 lb (68.9 kg)  Height: 4\' 11"  (1.499 m)   Body mass index is 30.7 kg/m. Wt Readings from Last 3 Encounters:  10/21/23 152 lb (68.9 kg)  09/06/23 149 lb 6.4 oz (67.8 kg)  09/03/23 150 lb (68 kg)    Physical Exam Constitutional:      General: She is not in acute distress.    Appearance: She is  well-developed. She is not diaphoretic.  HENT:     Head: Normocephalic and atraumatic.     Mouth/Throat:     Pharynx: No oropharyngeal exudate.  Eyes:     Conjunctiva/sclera: Conjunctivae normal.     Pupils: Pupils are equal, round, and reactive to light.  Cardiovascular:     Rate and Rhythm: Normal rate and regular rhythm.     Heart sounds: Normal heart sounds.  Pulmonary:     Effort: Pulmonary effort is normal.     Breath sounds: Normal breath sounds.  Abdominal:     General: Bowel sounds are normal.     Palpations: Abdomen is soft.  Musculoskeletal:        General: No tenderness.     Cervical back: Normal range of motion and neck supple.  Skin:    General: Skin is warm and dry.  Neurological:     Mental Status: She is alert and oriented to person, place, and time.     Motor: No weakness.     Gait: Gait normal.  Psychiatric:        Mood and Affect: Mood normal.     Labs reviewed: Basic Metabolic Panel: Recent Labs    04/29/23 1050 06/30/23 1223 07/01/23 1232 09/06/23 1406  NA 142 143  --  143  K 4.9 5.8* 4.8 4.9  CL 103 108*  --  105  CO2 22 21  --  24  GLUCOSE 108* 84  --  99  BUN 39* 36  --  34  CREATININE 1.65* 1.54*  --  1.54*  CALCIUM 9.7 10.2  --  9.8   Liver Function Tests: Recent Labs    04/12/23 0735  AST 14  ALT 6  BILITOT 0.8  PROT 6.2   No results for input(s): "LIPASE", "AMYLASE" in the last 8760 hours. No results for input(s): "AMMONIA" in the last 8760 hours. CBC: Recent Labs    04/12/23 0735 06/30/23 1223  WBC 6.3 6.4  NEUTROABS 3,956  --   HGB 10.9* 11.2  HCT 34.0* 34.5  MCV 93.9 97  PLT 432* 391   Lipid Panel: Recent Labs    04/12/23 0735  CHOL 165  HDL 57  LDLCALC 90  TRIG 87  CHOLHDL 2.9   TSH: No results for input(s): "TSH" in the last 8760 hours. A1C: Lab Results  Component Value Date   HGBA1C 5.4 08/20/2018     Assessment/Plan Assessment and Plan   Constipation and diarrhea  Reports of intermittent  diarrhea and loose stools. Possible food triggers identified (chocolate, dairy). Currently not taking fiber supplements regularly. -Start Benefiber 1-2 times daily to help bulk stool and improve bowel regularity. -Avoid identified potential food triggers (chocolate, dairy). -Consider keeping a food journal to identify any additional triggers.  Vaginal Dryness Reports of external vaginal itching, no discharge. Currently using an unspecified over-the-counter lubricant inconsistently. -Apply over-the-counter vaginal lubricant twice daily consistently. -Consider alternative lubricants if current one is not effective.  Atrial Fibrillation Reports of occasional palpitations and shortness of breath on exertion. Currently on Metoprolol 12.5mg  daily and Xarelto. No abnormal bruising or bleeding reported. -Continue current medications. -Cardiologist to monitor condition.  Chronic Kidney Disease stage 3 Stable on recent lab work.  -Chronic and stable Encourage proper hydration Follow metabolic panel Avoid nephrotoxic meds (NSAIDS)  CAD Stable, continue metoprolol  Acquired thrombophilia   Due to a fib, continues on xarelto  Follow-up in 6 months.Janene Harvey. Biagio Borg  [facility] [phone]

## 2023-10-21 NOTE — Patient Instructions (Signed)
 Add benefiber 1-2 times daily Avoid ice cream and chocolate  To get OTC lubricate for vaginal dryness- apply 2 times daily routinely

## 2023-12-07 ENCOUNTER — Encounter: Payer: Self-pay | Admitting: Nurse Practitioner

## 2023-12-07 ENCOUNTER — Ambulatory Visit: Payer: Medicare Other | Admitting: Nurse Practitioner

## 2023-12-07 VITALS — BP 120/75

## 2023-12-07 DIAGNOSIS — K219 Gastro-esophageal reflux disease without esophagitis: Secondary | ICD-10-CM

## 2023-12-07 DIAGNOSIS — Z0000 Encounter for general adult medical examination without abnormal findings: Secondary | ICD-10-CM

## 2023-12-07 DIAGNOSIS — M810 Age-related osteoporosis without current pathological fracture: Secondary | ICD-10-CM

## 2023-12-07 DIAGNOSIS — R0981 Nasal congestion: Secondary | ICD-10-CM

## 2023-12-07 DIAGNOSIS — Z91030 Bee allergy status: Secondary | ICD-10-CM

## 2023-12-07 MED ORDER — PANTOPRAZOLE SODIUM 40 MG PO TBEC: 40.0000 mg | DELAYED_RELEASE_TABLET | Freq: Every day | ORAL | 0 refills | Status: DC

## 2023-12-07 MED ORDER — EPINEPHRINE 0.3 MG/0.3ML IJ SOAJ: 0.3000 mg | INTRAMUSCULAR | 3 refills | Status: AC | PRN

## 2023-12-07 MED ORDER — FLUTICASONE PROPIONATE 50 MCG/ACT NA SUSP: 2.0000 | NASAL | 1 refills | Status: AC | PRN

## 2023-12-07 MED ORDER — OLOPATADINE HCL 0.1 % OP SOLN: 1.0000 [drp] | Freq: Two times a day (BID) | OPHTHALMIC | 2 refills | Status: AC | PRN

## 2023-12-07 NOTE — Progress Notes (Signed)
 This service is provided via telemedicine  No vital signs collected/recorded due to the encounter was a telemedicine visit.   Location of patient (ex: home, work):  Home  Patient consents to a telephone visit:  Yes  Location of the provider (ex: office, home):  Office [place].   Name of any referring provider:  na  Names of all persons participating in the telemedicine service and their role in the encounter:  Natalynn, Pedone May, CMA, [facility], NP  Time spent on call:  7:12  Subjective:   Brittney [name] is a 89 y.o. female who presents for Medicare Annual (Subsequent) preventive examination.  Visit Complete: Virtual I connected with  Brittney [name] on 12/07/23 by a video and audio enabled telemedicine application and verified that I am speaking with the correct person using two identifiers.  Patient Location: Home  Provider Location: Office/Clinic  I discussed the limitations of evaluation and management by telemedicine. The patient expressed understanding and agreed to proceed.  Vital Signs: Because this visit was a virtual/telehealth visit, some criteria may be missing or patient reported. Any vitals not documented were not able to be obtained and vitals that have been documented are patient reported.  Cardiac Risk Factors include: advanced age (>43men, >91 women);hypertension     Objective:    Today's Vitals   12/07/23 1101 12/07/23 1112  BP: 120/75   PainSc:  1    There is no height or weight on file to calculate BMI.     12/07/2023   10:59 AM 09/03/2023   11:01 AM 04/08/2023    8:52 AM 03/25/2023    9:55 AM 01/13/2023    3:00 PM 11/30/2022    4:45 PM 10/08/2022   10:24 AM  Advanced Directives  Does Patient Have a Medical Advance Directive? Yes No Yes Yes Yes Yes Yes  Type of Estate agent of [place];Out of facility DNR (pink MOST or yellow form)  Healthcare Power of [place];Out of facility DNR (pink MOST or yellow  form) Healthcare Power of [place];Out of facility DNR (pink MOST or yellow form) Healthcare Power of [place];Out of facility DNR (pink MOST or yellow form);Living will Healthcare Power of [place];Out of facility DNR (pink MOST or yellow form) Out of facility DNR (pink MOST or yellow form)  Does patient want to make changes to medical advance directive? No - Patient declined  No - Patient declined No - Patient declined No - Patient declined No - Patient declined No - Guardian declined  Copy of Healthcare Power of Attorney in Chart? Yes - validated most recent copy scanned in chart (See row information)  Yes - validated most recent copy scanned in chart (See row information) Yes - validated most recent copy scanned in chart (See row information) No - copy requested Yes - validated most recent copy scanned in chart (See row information)   Pre-existing out of facility DNR order (yellow form or pink MOST form)   Yellow form placed in chart (order not valid for inpatient use)   Yellow form placed in chart (order not valid for inpatient use) Yellow form placed in chart (order not valid for inpatient use)    Current Medications (verified) Outpatient Encounter Medications as of 12/07/2023  Medication Sig   acetaminophen (TYLENOL) 500 MG tablet Take 1,000 mg by mouth 2 (two) times daily as needed.   Cholecalciferol (VITAMIN D) 50 MCG (2000 UT) tablet Take 2,000 Units by mouth daily.   cyanocobalamin (VITAMIN B12) 1000 MCG tablet Take  1,000 mcg by mouth 3 (three) times a week.   furosemide (LASIX) 20 MG tablet Take 0.5 tablets (10 mg total) by mouth daily as needed (shortness of breath, or swelling, or daily weight gain greater than 3 pounds).   loratadine (CLARITIN) 10 MG tablet Take 10 mg by mouth daily.   metoprolol succinate (TOPROL-XL) 25 MG 24 hr tablet Take 0.5 tablets (12.5 mg total) by mouth daily.   olopatadine (PATANOL) 0.1 % ophthalmic solution Place 1 drop into both eyes 2 (two) times daily as  needed for allergies.   pantoprazole (PROTONIX) 40 MG tablet Take 1 tablet (40 mg total) by mouth daily.   XARELTO 15 MG TABS tablet TAKE ONE TABLET BY MOUTH EVERY DAY WITH SUPPER   [DISCONTINUED] EPINEPHrine 0.3 mg/0.3 mL IJ SOAJ injection Inject 0.3 mg into the muscle as needed for anaphylaxis.   [DISCONTINUED] fluticasone (FLONASE) 50 MCG/ACT nasal spray Place 2 sprays into both nostrils as needed.   [DISCONTINUED] Olopatadine HCl (PAZEO) 0.7 % SOLN Place 1 drop into both eyes daily as needed.   EPINEPHrine 0.3 mg/0.3 mL IJ SOAJ injection Inject 0.3 mg into the muscle as needed for anaphylaxis.   fluticasone (FLONASE) 50 MCG/ACT nasal spray Place 2 sprays into both nostrils as needed.   No facility-administered encounter medications on file as of 12/07/2023.    Allergies (verified) Lisinopril, Mixed grasses, Bee venom, Azithromycin, Baclofen, Iodine, Morphine and codeine, [place] clam (m. mercenaria) skin test, and Sulfa antibiotics   History: Past Medical History:  Diagnosis Date   Adopted person    Allergy    Anemia    Angina pectoris (HCC)    Aortic atherosclerosis (HCC)    Arthritis    Atrial fibrillation (HCC)    a. ) CHA2DS2-VASc = 7 (age x 2, sex, HTN, TIA x2, aortic plaque);  b.) rate/rhythm maintained on oral metoprolol succinate; chronically anticoagulated using dose reduced apixaban.   Bilateral carotid artery disease (HCC)    CAD (coronary artery disease)    a.) LHC 02/09/2007: EF 60%, LVEDP 25 mmHg, normal cors; b.) cCTA 11/27/2011: Ca score 129.73 --> 1.17 RCA, 16.36 LM, 112.21 LAD, 0 LCx; c.) MPI 03/13/2014 - normal.   Cataract    CKD (chronic kidney disease), stage IV (HCC)    Diastolic dysfunction 02/09/2008   a.) LHC 02/09/2008: EF 60%, LVEDP 25 mmHg; b.) TTE 08/07/2010: EF 65%, mild LVH, mild MR, G2DD; b.) TTE 08/20/2018: EF 45-50%, inferior HK, mild LAE; c.) TTE 08/11/2021: EF 45-50%. 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   Left carotid bruit    Long term current use of anticoagulant    a.) apixaban   Lumbar stenosis    Lymphocytic colitis    Osteoporosis    TIA (transient ischemic attack) 07/2018   TIA (transient ischemic attack) 08/2021   Vitamin B12 deficiency    Wears hearing BILATERAL aids    Wears partial (bottom) dentures    Past Surgical History:  Procedure Laterality Date   BREAST EXCISIONAL BIOPSY Right 36 years ago   neg   BUNIONECTOMY Bilateral    COLONOSCOPY N/A 07/02/2008   KNEE ARTHROPLASTY Right 03/11/2022   Procedure: COMPUTER ASSISTED TOTAL KNEE ARTHROPLASTY;  Surgeon: Arlyne Lame, MD;  Location: [facility];  Service: Orthopedics;  Laterality: Right;  KNEE ARTHROSCOPY Right 2006   LEFT HEART CATH AND CORONARY ANGIOGRAPHY Left 02/09/2007   Procedure: LEFT HEART CATH AND CORONARY ANGIOGRAPHY; Location: [facility]   TONSILLECTOMY AND ADENOIDECTOMY Bilateral    TOTAL ABDOMINAL HYSTERECTOMY W/ BILATERAL SALPINGOOPHORECTOMY N/A    TOTAL KNEE ARTHROPLASTY Left 04/26/2014   TUBAL LIGATION     Family History  Adopted: Yes  Problem Relation Age of Onset   Heart disease Sister    Stroke Brother    Social History   Socioeconomic History   Marital status: Widowed    Spouse name: Not on file   Number of children: Not on file   Years of education: Not on file   Highest education level: Not on file  Occupational History   Not on file  Tobacco Use   Smoking status: Never   Smokeless tobacco: Never  Vaping Use   Vaping status: Never Used  Substance and Sexual Activity   Alcohol use: Not Currently   Drug use: Never   Sexual activity: Not Currently  Other Topics Concern   Not on file  Social History Narrative   Not on file   Social Drivers of Health   Financial Resource Strain: Not on file  Food Insecurity: No Food  Insecurity (12/07/2023)   Hunger Vital Sign    Worried About Running Out of Food in the Last Year: Never true    Ran Out of Food in the Last Year: Never true  Transportation Needs: No Transportation Needs (12/07/2023)   PRAPARE - Administrator, Civil Service (Medical): No    Lack of Transportation (Non-Medical): No  Physical Activity: Not on file  Stress: Not on file  Social Connections: Not on file    Tobacco Counseling Counseling given: Not Answered   Clinical Intake:  Pre-visit preparation completed: Yes  Pain : 0-10 Pain Score: 1  Pain Type: Acute pain Pain Location: Abdomen Pain Descriptors / Indicators: Guarding Pain Onset: More than a month ago Pain Frequency: Intermittent     BMI - recorded: 30 Nutritional Status: BMI > 30  Obese Nutritional Risks: None Diabetes: No  How often do you need to have someone help you when you read instructions, pamphlets, or other written materials from your doctor or pharmacy?: 1 - Never         Activities of Daily Living    12/07/2023   10:59 AM  In your present state of health, do you have any difficulty performing the following activities:  Hearing? 1  Vision? 0  Difficulty concentrating or making decisions? 0  Walking or climbing stairs? 0  Dressing or bathing? 0  Doing errands, shopping? 0  Preparing Food and eating ? N  Using the Toilet? N  In the past six months, have you accidently leaked urine? Y  Do you have problems with loss of bowel control? Y  Managing your Medications? N  Managing your Finances? N  Housekeeping or managing your Housekeeping? N    Patient Care Team: [name], NP as PCP - General (Geriatric Medicine) Debbe Odea, MD as PCP - Cardiology (Cardiology) Domingo Madeira, OD (Optometry)  Indicate any recent Medical Services you may have received from other than [facility] providers in the past year (date may be approximate).     Assessment:   This is a routine  wellness examination for [facility].  Hearing/Vision screen Vision Screening - Comments:: Dr Larence Penning Last Exam: 08/2023   Goals Addressed   None    Depression Screen  12/07/2023   11:02 AM 04/08/2023   10:32 AM 03/25/2023    9:55 AM 12/01/2022   10:52 AM 09/24/2022    1:14 PM 04/07/2022   10:26 AM 11/25/2021    1:52 PM  PHQ 2/9 Scores  PHQ - 2 Score 0 0 0 0 0 0 0  Exception Documentation  Other- indicate reason in comment box       Not completed  Connected with Grief group onsite at [facility] Risk    12/07/2023   11:01 AM 04/08/2023   10:32 AM 03/25/2023    9:55 AM 12/01/2022   10:52 AM 10/08/2022   10:25 AM  Fall Risk   Falls in the past year? 1 0 0 0 0  Number falls in past yr: 0 0 0 0 0  Injury with Fall? 1 0 0 0 0  Risk for fall due to : Impaired balance/gait No Fall Risks  No Fall Risks No Fall Risks  Follow up Falls evaluation completed Falls evaluation completed  Falls evaluation completed Falls evaluation completed    MEDICARE RISK AT HOME: Medicare Risk at Home Any stairs in or around the home?: Yes If so, are there any without handrails?: No Home free of loose throw rugs in walkways, pet beds, electrical cords, etc?: Yes Adequate lighting in your home to reduce risk of falls?: Yes Life alert?: Yes Use of a cane, walker or w/c?: No Grab bars in the bathroom?: Yes Shower chair or bench in shower?: No Elevated toilet seat or a handicapped toilet?: No  TIMED UP AND GO:  Was the test performed?  No    Cognitive Function:        12/07/2023   11:02 AM 12/01/2022   10:54 AM 11/25/2021    1:55 PM  6CIT Screen  What Year? 0 points 0 points 0 points  What month? 0 points 0 points 0 points  What time? 0 points 0 points 0 points  Count back from 20 0 points 0 points 0 points  Months in reverse 0 points 2 points 0 points  Repeat phrase 0 points 0 points 0 points  Total Score 0 points 2 points 0 points    Immunizations Immunization History  Administered  Date(s) Administered   DTaP 05/20/2017   Fluad Quad(high Dose 65+) 05/20/2017   Influenza Inj Mdck Quad Pf 05/31/2018, 05/31/2019   Influenza, High Dose Seasonal PF 07/28/2010, 06/24/2011, 05/29/2013, 07/31/2014   Influenza-Unspecified 07/04/2007, 05/20/2017, 06/01/2020, 06/09/2022, 06/17/2023   Moderna Covid-19 Fall Seasonal Vaccine 12yrs & older 12/01/2022   Moderna Covid-19 Vaccine Bivalent Booster 31yrs & up 01/20/2022   Moderna Sars-Covid-2 Vaccination 09/05/2019, 10/03/2019, 07/09/2020, 01/09/2021, 05/21/2023   Pfizer Covid-19 Vaccine Bivalent Booster 84yrs & up 05/15/2021   Pfizer(Comirnaty)Fall Seasonal Vaccine 12 years and older 07/03/2022   Pneumococcal Conjugate-13 08/20/2015   Pneumococcal Polysaccharide-23 07/28/2010, 07/28/2010   RSV,unspecified 02/01/2023   Respiratory Syncytial Virus Vaccine,Recomb Aduvanted(Arexvy) 02/01/2023   Tdap 01/22/2017, 05/20/2017   Unspecified SARS-COV-2 Vaccination 12/03/2023    TDAP status: Up to date  Flu Vaccine status: Up to date  Pneumococcal vaccine status: Up to date  Covid-19 vaccine status: Information provided on how to obtain vaccines.   Qualifies for Shingles Vaccine? Yes   Zostavax completed No   Shingrix Completed?: No.    Education has been provided regarding the importance of this vaccine. Patient has been advised to call insurance company to determine out of pocket expense if they have  not yet received this vaccine. Advised may also receive vaccine at local pharmacy or Health Dept. Verbalized acceptance and understanding.  Screening Tests Health Maintenance  Topic Date Due   Zoster Vaccines- Shingrix (1 of 2) Never done   INFLUENZA VACCINE  03/24/2024   COVID-19 Vaccine (11 - Moderna risk 2024-25 season) 06/03/2024   Medicare Annual Wellness (AWV)  12/06/2024   DTaP/Tdap/Td (4 - Td or Tdap) 05/21/2027   Pneumonia Vaccine 39+ Years old  Completed   DEXA SCAN  Completed   HPV VACCINES  Aged Out   Meningococcal B  Vaccine  Aged Out    Health Maintenance  Health Maintenance Due  Topic Date Due   Zoster Vaccines- Shingrix (1 of 2) Never done    Colorectal cancer screening: No longer required.   Mammogram status: No longer required due to age.  Bone Density status: Completed 02/02/2022. Results reflect: Bone density results: OSTEOPOROSIS. Repeat every 2 years. However declines further dexa scan at this time  Lung Cancer Screening: (Low Dose CT Chest recommended if Age 40-80 years, 20 pack-year currently smoking OR have quit w/in 15years.) does not qualify.   Lung Cancer Screening Referral: na  Additional Screening:  Hepatitis C Screening: does not qualify; Completed na  Vision Screening: Recommended annual ophthalmology exams for early detection of glaucoma and other disorders of the eye. Is the patient up to date with their annual eye exam?  Yes  Who is the provider or what is the name of the office in which the patient attends annual eye exams? Dr Rosan Comfort If pt is not established with a provider, would they like to be referred to a provider to establish care? No .   Dental Screening: Recommended annual dental exams for proper oral hygiene  Community Resource Referral / Chronic Care Management: CRR required this visit?  No   CCM required this visit?  No     Plan:     I have personally reviewed and noted the following in the patient's chart:   Medical and social history Use of alcohol, tobacco or illicit drugs  Current medications and supplements including opioid prescriptions. Patient is not currently taking opioid prescriptions. Functional ability and status Nutritional status Physical activity Advanced directives List of other physicians Hospitalizations, surgeries, and ER visits in previous 12 months Vitals Screenings to include cognitive, depression, and falls Referrals and appointments  In addition, I have reviewed and discussed with patient certain preventive protocols,  quality metrics, and best practice recommendations. A written personalized care plan for preventive services as well as general preventive health recommendations were provided to patient.     [name], NP   12/07/2023   After Visit Summary: (MyChart) Due to this being a telephonic visit, the after visit summary with patients personalized plan was offered to patient via MyChart

## 2023-12-22 ENCOUNTER — Ambulatory Visit: Payer: Self-pay

## 2023-12-22 NOTE — Telephone Encounter (Signed)
 Copied From CRM [id number]. Reason for Triage: patient have gout flare up worsen on 12/21/23 toe got real hot The decision tree prompt to send message to nurse triage Patient call [phone]    Chief Complaint: Toe Pain Symptoms: reddened Frequency: intermittent Pertinent Negatives: Patient denies pain at this time Disposition: [] ED /[] Urgent Care (no appt availability in office) / [x] Appointment(In office/virtual)/ []  [facility]/ [] Home Care/ [] Refused Recommended Disposition /[] [facility]/ []  Follow-up with PCP Additional Notes: Pt reports episode of gout last night, states that the pain is better this monring but she would like to be seen to discuss restarting gout medicine. Appt scheduled for 05/01  Reason for Disposition  Pain in the big toe joint  Answer Assessment - Initial Assessment Questions 1. ONSET: "When did the pain start?"      Last night  2. LOCATION: "Where is the pain located?"   (e.g., around nail, entire toe, at foot joint)      Left great toe  3. PAIN: "How bad is the pain?"    (Scale 1-10; or mild, moderate, severe)   -  MILD (1-3): doesn't interfere with normal activities    -  MODERATE (4-7): interferes with normal activities (e.g., work or school) or awakens from sleep, limping    -  SEVERE (8-10): excruciating pain, unable to do any normal activities, unable to walk     No pain at this time  4. APPEARANCE: "What does the toe look like?" (e.g., redness, swelling, bruising, pallor)     Looks fine, but last night it was hot and sore  5. CAUSE: "What do you think is causing the toe pain?"     Gout flare  6. OTHER SYMPTOMS: "Do you have any other symptoms?" (e.g., leg pain, rash, fever, numbness)     No  7. PREGNANCY: "Is there any chance you are pregnant?" "When was your last menstrual period?"     No  Protocols used: Toe Pain-A-AH

## 2023-12-22 NOTE — Telephone Encounter (Addendum)
 Patient added to schedule. Message routed to PCP Roselie Conger, [place], NP as FYI

## 2023-12-23 ENCOUNTER — Ambulatory Visit: Admitting: Nurse Practitioner

## 2023-12-23 ENCOUNTER — Encounter: Payer: Self-pay | Admitting: Nurse Practitioner

## 2023-12-23 VITALS — BP 118/62 | HR 88 | Temp 98.1°F | Ht 59.0 in | Wt 152.0 lb

## 2023-12-23 DIAGNOSIS — I4821 Permanent atrial fibrillation: Secondary | ICD-10-CM | POA: Diagnosis not present

## 2023-12-23 DIAGNOSIS — M79675 Pain in left toe(s): Secondary | ICD-10-CM | POA: Diagnosis not present

## 2023-12-23 DIAGNOSIS — N1831 Chronic kidney disease, stage 3a: Secondary | ICD-10-CM

## 2023-12-23 DIAGNOSIS — K219 Gastro-esophageal reflux disease without esophagitis: Secondary | ICD-10-CM | POA: Diagnosis not present

## 2023-12-23 NOTE — Progress Notes (Signed)
 Careteam: Patient Care Team: [name], NP as PCP - General (Geriatric Medicine) [name], MD as PCP - Cardiology (Cardiology) [name], OD (Optometry) PLACE OF SERVICE:  [facility]   Advanced Directive information    Allergies  Allergen Reactions   Lisinopril Swelling   Mixed Grasses Anaphylaxis   Bee Venom Swelling   Azithromycin Diarrhea   Baclofen Diarrhea   Iodine     CKD   Morphine  And Codeine Nausea And Vomiting   [place] Clam (M. Mercenaria) Skin Test Diarrhea    Mussels.   Sulfa Antibiotics Rash    Chief Complaint  Patient presents with   Gout    Gout Flare. Pain in [place] Toe.    Discussed the use of AI scribe software for clinical note transcription with the patient, who gave verbal consent to proceed.  History of Present Illness   Brittney [name] "[name]" is a 89 year old female with a history of gout who presents with a recent flare of left great toe pain.  She experienced a severe episode of pain in her left great toe the other night, describing it as 'not good at all.' The pain lasted all night and improved by the next day. No swelling or pain was present at the time of the visit. She has a history of gout, with a significant episode approximately twelve years ago, for which she was prescribed allopurinol. She discontinued the medication after several years as she felt fine without it. Currently, she is not on allopurinol.   She has chronic kidney disease and is cautious about medications that may affect her kidneys. Previously prescribed Protonix  for stomach issues, she has not taken it due to concerns about kidney effects. She manages her stomach symptoms by adjusting the timing of her current medications, which has improved her symptoms.  She takes Lasix  once a day, half a pill, as needed for fluid retention. If not taken, she experiences shortness of breath and swollen ankles. .  Review of Systems:  Review  of Systems  Constitutional:  Negative for chills, fever and weight loss.  HENT:  Negative for tinnitus.   Respiratory:  Negative for cough, sputum production and shortness of breath.   Cardiovascular:  Negative for chest pain, palpitations and leg swelling.  Gastrointestinal:  Negative for abdominal pain, constipation, diarrhea and heartburn.  Genitourinary:  Negative for dysuria, frequency and urgency.  Musculoskeletal:  Positive for joint pain. Negative for back pain, falls and myalgias.  Skin: Negative.   Neurological:  Negative for dizziness and headaches.  Psychiatric/Behavioral:  Negative for depression and memory loss. The patient does not have insomnia.     Past Medical History:  Diagnosis Date   Adopted person    Allergy    Anemia    Angina pectoris (HCC)    Aortic atherosclerosis (HCC)    Arthritis    Atrial fibrillation (HCC)    a. ) CHA2DS2-VASc = 7 (age x 2, sex, HTN, TIA x2, aortic plaque);  b.) rate/rhythm maintained on oral metoprolol  succinate; chronically anticoagulated using dose reduced apixaban .   Bilateral carotid artery disease (HCC)    CAD (coronary artery disease)    a.) LHC 02/09/2007: EF 60%, LVEDP 25 mmHg, normal cors; b.) cCTA 11/27/2011: Ca score 129.73 --> 1.17 RCA, 16.36 LM, 112.21 LAD, 0 LCx; c.) MPI 03/13/2014 - normal.   Cataract    CKD (chronic kidney disease), stage IV (HCC)    Diastolic dysfunction 02/09/2008  a.) LHC 02/09/2008: EF 60%, LVEDP 25 mmHg; b.) TTE 08/07/2010: EF 65%, mild LVH, mild MR, G2DD; b.) TTE 08/20/2018: EF 45-50%, inferior HK, mild LAE; c.) TTE 08/11/2021: EF 45-50%. 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   Left carotid bruit    Long term current use of anticoagulant    a.) apixaban    Lumbar stenosis    Lymphocytic colitis    Osteoporosis    TIA  (transient ischemic attack) 07/2018   TIA (transient ischemic attack) 08/2021   Vitamin B12 deficiency    Wears hearing BILATERAL aids    Wears partial (bottom) dentures    Past Surgical History:  Procedure Laterality Date   BREAST EXCISIONAL BIOPSY Right 36 years ago   neg   BUNIONECTOMY Bilateral    COLONOSCOPY N/A 07/02/2008   KNEE ARTHROPLASTY Right 03/11/2022   Procedure: COMPUTER ASSISTED TOTAL KNEE ARTHROPLASTY;  Surgeon: Arlyne Lame, MD;  Location: [facility] ORS;  Service: Orthopedics;  Laterality: Right;   KNEE ARTHROSCOPY Right 2006   LEFT HEART CATH AND CORONARY ANGIOGRAPHY Left 02/09/2007   Procedure: LEFT HEART CATH AND CORONARY ANGIOGRAPHY; Location: [facility]   TONSILLECTOMY AND ADENOIDECTOMY Bilateral    TOTAL ABDOMINAL HYSTERECTOMY W/ BILATERAL SALPINGOOPHORECTOMY N/A    TOTAL KNEE ARTHROPLASTY Left 04/26/2014   TUBAL LIGATION     Social History:   reports that she has never smoked. She has never used smokeless tobacco. She reports that she does not currently use alcohol. She reports that she does not use drugs.  Family History  Adopted: Yes  Problem Relation Age of Onset   Heart disease Sister    Stroke Brother     Medications: Patient's Medications  New Prescriptions   No medications on file  Previous Medications   ACETAMINOPHEN  (TYLENOL ) 500 MG TABLET    Take 1,000 mg by mouth 2 (two) times daily as needed.   CHOLECALCIFEROL  (VITAMIN D ) 50 MCG (2000 UT) TABLET    Take 2,000 Units by mouth daily.   CYANOCOBALAMIN  (VITAMIN B12) 1000 MCG TABLET    Take 1,000 mcg by mouth 3 (three) times a week.   EPINEPHRINE  0.3 MG/0.3 ML IJ SOAJ INJECTION    Inject 0.3 mg into the muscle as needed for anaphylaxis.   FLUTICASONE  (FLONASE ) 50 MCG/ACT NASAL SPRAY    Place 2 sprays into both nostrils as needed.   FUROSEMIDE  (LASIX ) 20 MG TABLET    Take 0.5 tablets (10 mg total) by mouth daily as needed (shortness of breath, or swelling, or daily weight gain greater than 3  pounds).   LORATADINE  (CLARITIN ) 10 MG TABLET    Take 10 mg by mouth daily.   METOPROLOL  SUCCINATE (TOPROL -XL) 25 MG 24 HR TABLET    Take 0.5 tablets (12.5 mg total) by mouth daily.   OLOPATADINE  (PATANOL) 0.1 % OPHTHALMIC SOLUTION    Place 1 drop into both eyes 2 (two) times daily as needed for allergies.   PANTOPRAZOLE  (PROTONIX ) 40 MG TABLET    Take 1 tablet (40 mg total) by mouth daily.   XARELTO  15 MG TABS TABLET    TAKE ONE TABLET BY MOUTH EVERY DAY WITH SUPPER  Modified Medications   No medications on file  Discontinued Medications   No medications on file    Physical Exam:  Vitals:   12/23/23 1017  BP: 118/62  Pulse: 88  Temp: 98.1 F (36.7 C)  SpO2: 98%  Weight: 152 lb (68.9 kg)  Height: 4\' 11"  (1.499 m)   Body mass index is 30.7 kg/m. Wt Readings from Last 3 Encounters:  12/23/23 152 lb (68.9 kg)  10/21/23 152 lb (68.9 kg)  09/06/23 149 lb 6.4 oz (67.8 kg)    Physical Exam Constitutional:      General: She is not in acute distress.    Appearance: She is well-developed. She is not diaphoretic.  HENT:     Head: Normocephalic and atraumatic.     Mouth/Throat:     Pharynx: No oropharyngeal exudate.  Eyes:     Conjunctiva/sclera: Conjunctivae normal.     Pupils: Pupils are equal, round, and reactive to light.  Cardiovascular:     Rate and Rhythm: Normal rate. Rhythm irregular.     Heart sounds: Normal heart sounds.  Pulmonary:     Effort: Pulmonary effort is normal.     Breath sounds: Normal breath sounds.  Abdominal:     General: Bowel sounds are normal.     Palpations: Abdomen is soft.  Musculoskeletal:     Cervical back: Normal range of motion and neck supple.     Right lower leg: No edema.     Left lower leg: No edema.  Skin:    General: Skin is warm and dry.  Neurological:     Mental Status: She is alert.  Psychiatric:        Mood and Affect: Mood normal.     Labs reviewed: Basic Metabolic Panel: Recent Labs    04/29/23 1050  06/30/23 1223 07/01/23 1232 09/06/23 1406  NA 142 143  --  143  K 4.9 5.8* 4.8 4.9  CL 103 108*  --  105  CO2 22 21  --  24  GLUCOSE 108* 84  --  99  BUN 39* 36  --  34  CREATININE 1.65* 1.54*  --  1.54*  CALCIUM  9.7 10.2  --  9.8   Liver Function Tests: Recent Labs    04/12/23 0735  AST 14  ALT 6  BILITOT 0.8  PROT 6.2   No results for input(s): "LIPASE", "AMYLASE" in the last 8760 hours. No results for input(s): "AMMONIA" in the last 8760 hours. CBC: Recent Labs    04/12/23 0735 06/30/23 1223  WBC 6.3 6.4  NEUTROABS 3,956  --   HGB 10.9* 11.2  HCT 34.0* 34.5  MCV 93.9 97  PLT 432* 391   Lipid Panel: Recent Labs    04/12/23 0735  CHOL 165  HDL 57  LDLCALC 90  TRIG 87  CHOLHDL 2.9   TSH: No results for input(s): "TSH" in the last 8760 hours. A1C: Lab Results  Component Value Date   HGBA1C 5.4 08/20/2018     Assessment/Plan Assessment and Plan    Atrial Fibrillation Managed with anticoagulation therapy. Requires hemoglobin monitoring due to anticoagulant use. - Check hemoglobin levels during upcoming blood work.  Toe pain Intermittent left great toe pain likely possibly due to gout. Asymptomatic currently. . Lasix  may increase uric acid levels. Prefers to avoid prednisone for flares.  - Check uric acid levels during upcoming blood work. - Implement low purine diet - Decrease Lasix  to every other day if able, adjust based on symptoms. - allopurinol if uric acid levels elevated.  Chronic Kidney Disease Well-managed. Protonix  discussed regarding kidney function impact. Reassured of safety with caution. -  Check kidney function during upcoming blood work.  Acid Reflux Symptoms improved with dietary modifications and medication timing. Protonix  not taken due to kidney concerns. Managed with food intake adjustments. - Remove Protonix  from medication list. - Continue dietary modifications to manage acid reflux.      Saxton Chain K. Denney Fisherman  [facility] [phone]

## 2023-12-23 NOTE — Patient Instructions (Signed)
 Decrease lasix  to every other day if able.

## 2024-01-03 ENCOUNTER — Encounter: Payer: Self-pay | Admitting: Nurse Practitioner

## 2024-01-04 ENCOUNTER — Other Ambulatory Visit: Payer: Self-pay

## 2024-01-04 ENCOUNTER — Ambulatory Visit: Payer: Self-pay

## 2024-01-04 LAB — COMPLETE METABOLIC PANEL WITHOUT GFR
AG Ratio: 2 (calc) (ref 1.0–2.5)
ALT: 7 U/L (ref 6–29)
AST: 18 U/L (ref 10–35)
Albumin: 4.1 g/dL (ref 3.6–5.1)
Alkaline phosphatase (APISO): 60 U/L (ref 37–153)
BUN/Creatinine Ratio: 27 (calc) — ABNORMAL HIGH (ref 6–22)
BUN: 39 mg/dL — ABNORMAL HIGH (ref 7–25)
CO2: 28 mmol/L (ref 20–32)
Calcium: 9.6 mg/dL (ref 8.6–10.4)
Chloride: 108 mmol/L (ref 98–110)
Creat: 1.47 mg/dL — ABNORMAL HIGH (ref 0.60–0.95)
Globulin: 2.1 g/dL (ref 1.9–3.7)
Glucose, Bld: 89 mg/dL (ref 65–99)
Potassium: 4.7 mmol/L (ref 3.5–5.3)
Sodium: 142 mmol/L (ref 135–146)
Total Bilirubin: 0.9 mg/dL (ref 0.2–1.2)
Total Protein: 6.2 g/dL (ref 6.1–8.1)

## 2024-01-04 LAB — CBC WITH DIFFERENTIAL/PLATELET
Absolute Lymphocytes: 1918 {cells}/uL (ref 850–3900)
Absolute Monocytes: 814 {cells}/uL (ref 200–950)
Basophils Absolute: 110 {cells}/uL (ref 0–200)
Basophils Relative: 1.6 %
Eosinophils Absolute: 0 {cells}/uL — ABNORMAL LOW (ref 15–500)
Eosinophils Relative: 0 %
HCT: 30.3 % — ABNORMAL LOW (ref 35.0–45.0)
Hemoglobin: 9.6 g/dL — ABNORMAL LOW (ref 11.7–15.5)
MCH: 29.3 pg (ref 27.0–33.0)
MCHC: 31.7 g/dL — ABNORMAL LOW (ref 32.0–36.0)
MCV: 92.4 fL (ref 80.0–100.0)
MPV: 10.5 fL (ref 7.5–12.5)
Monocytes Relative: 11.8 %
Neutro Abs: 4057 {cells}/uL (ref 1500–7800)
Neutrophils Relative %: 58.8 %
Platelets: 437 10*3/uL — ABNORMAL HIGH (ref 140–400)
RBC: 3.28 10*6/uL — ABNORMAL LOW (ref 3.80–5.10)
RDW: 13.3 % (ref 11.0–15.0)
Total Lymphocyte: 27.8 %
WBC: 6.9 10*3/uL (ref 3.8–10.8)

## 2024-01-04 LAB — IRON,TIBC AND FERRITIN PANEL
%SAT: 16 % (ref 16–45)
Ferritin: 12 ng/mL — ABNORMAL LOW (ref 16–288)
Iron: 67 ug/dL (ref 45–160)
TIBC: 413 ug/dL (ref 250–450)

## 2024-01-04 LAB — TEST AUTHORIZATION

## 2024-01-04 LAB — URIC ACID: Uric Acid, Serum: 8.6 mg/dL — ABNORMAL HIGH (ref 2.5–7.0)

## 2024-01-04 MED ORDER — ALLOPURINOL 100 MG PO TABS: 50.0000 mg | ORAL_TABLET | Freq: Every day | ORAL | 6 refills | Status: DC

## 2024-01-07 ENCOUNTER — Other Ambulatory Visit: Payer: Self-pay | Admitting: Nurse Practitioner

## 2024-01-07 DIAGNOSIS — D649 Anemia, unspecified: Secondary | ICD-10-CM

## 2024-01-07 DIAGNOSIS — N1831 Chronic kidney disease, stage 3a: Secondary | ICD-10-CM

## 2024-01-10 ENCOUNTER — Ambulatory Visit: Admitting: Family

## 2024-01-10 ENCOUNTER — Ambulatory Visit: Payer: Self-pay

## 2024-01-10 NOTE — Telephone Encounter (Signed)
 Noted thank you, we can always see her in clinic tomorrow

## 2024-01-10 NOTE — Telephone Encounter (Signed)
 Appointment scheduled for 01/11/2024 at [facility]

## 2024-01-10 NOTE — Telephone Encounter (Signed)
 Copied from CRM [id number]. Topic: Clinical - Red Word Triage >> Jan 10, 2024 11:52 AM Danelle Dunning F wrote: [facility] that prompted transfer to Nurse Triage:   Shortness of breath   Chief Complaint: Dyspnea Symptoms: Dyspnea with exertion, Hoarse Throat,  Frequency: Acute Pertinent Negatives: Patient denies distress like symptoms  Disposition: [] ED /[] Urgent Care (no appt availability in office) / [] Appointment(In office/virtual)/ []  [facility]/ [] Home Care/ [x] Refused Recommended Disposition /[] [facility]/ []  Follow-up with PCP Additional Notes: The patient is being triaged for dyspnea with exertion that only started yesterday. Recommended the patient see someone today in office, patient refused due to her not being able to get to the appointment. Opted to wait on the nurse to check on her and tell her what to do.   Reason for Disposition  [1] MILD difficulty breathing (e.g., minimal/no SOB at rest, SOB with walking, pulse <100) AND [2] NEW-onset or WORSE than normal  Answer Assessment - Initial Assessment Questions 1. RESPIRATORY STATUS: "Describe your breathing?" (e.g., wheezing, shortness of breath, unable to speak, severe coughing)     Dyspnea with exertion   2. ONSET: "When did this breathing problem begin?"      Yesterday  3. PATTERN "Does the difficult breathing come and go, or has it been constant since it started?"      Intermittent  4. SEVERITY: "How bad is your breathing?" (e.g., mild, moderate, severe)    - MILD: No SOB at rest, mild SOB with walking, speaks normally in sentences, can lie down, no retractions, pulse < 100.    - MODERATE: SOB at rest, SOB with minimal exertion and prefers to sit, cannot lie down flat, speaks in phrases, mild retractions, audible wheezing, pulse 100-120.    - SEVERE: Very SOB at rest, speaks in single words, struggling to breathe, sitting hunched forward, retractions, pulse > 120      Mild  5. RECURRENT SYMPTOM: "Have  you had difficulty breathing before?" If Yes, ask: "When was the last time?" and "What happened that time?"      No  6. CARDIAC HISTORY: "Do you have any history of heart disease?" (e.g., heart attack, angina, bypass surgery, angioplasty)      No  7. LUNG HISTORY: "Do you have any history of lung disease?"  (e.g., pulmonary embolus, asthma, emphysema)     No 8. CAUSE: "What do you think is causing the breathing problem?"      Unsure  9. OTHER SYMPTOMS: "Do you have any other symptoms? (e.g., dizziness, runny nose, cough, chest pain, fever)     No  10. O2 SATURATION MONITOR:  "Do you use an oxygen saturation monitor (pulse oximeter) at home?" If Yes, ask: "What is your reading (oxygen level) today?" "What is your usual oxygen saturation reading?" (e.g., 95%)       94-96  11. PREGNANCY: "Is there any chance you are pregnant?" "When was your last menstrual period?"       No and No  12. TRAVEL: "Have you traveled out of the country in the last month?" (e.g., travel history, exposures)       No  Protocols used: Breathing Difficulty-A-AH

## 2024-01-11 ENCOUNTER — Ambulatory Visit
Admission: RE | Admit: 2024-01-11 | Discharge: 2024-01-11 | Disposition: A | Discharge status: Home / Self Care | Source: Ambulatory Visit | Attending: Nurse Practitioner | Admitting: Nurse Practitioner

## 2024-01-11 ENCOUNTER — Ambulatory Visit: Admitting: Nurse Practitioner

## 2024-01-11 ENCOUNTER — Encounter: Payer: Self-pay | Admitting: Nurse Practitioner

## 2024-01-11 ENCOUNTER — Ambulatory Visit: Payer: Self-pay | Admitting: Nurse Practitioner

## 2024-01-11 VITALS — BP 128/74 | HR 81 | Temp 97.2°F | Ht 59.0 in | Wt 153.6 lb

## 2024-01-11 DIAGNOSIS — N1831 Chronic kidney disease, stage 3a: Secondary | ICD-10-CM

## 2024-01-11 DIAGNOSIS — M109 Gout, unspecified: Secondary | ICD-10-CM

## 2024-01-11 DIAGNOSIS — D649 Anemia, unspecified: Secondary | ICD-10-CM

## 2024-01-11 DIAGNOSIS — I4821 Permanent atrial fibrillation: Secondary | ICD-10-CM | POA: Diagnosis not present

## 2024-01-11 DIAGNOSIS — I5032 Chronic diastolic (congestive) heart failure: Secondary | ICD-10-CM | POA: Diagnosis not present

## 2024-01-11 DIAGNOSIS — R0602 Shortness of breath: Secondary | ICD-10-CM | POA: Diagnosis not present

## 2024-01-11 NOTE — Progress Notes (Signed)
 Careteam: Patient Care Team: [name], NP as PCP - General (Geriatric Medicine) [name], MD as PCP - Cardiology (Cardiology) [name], OD (Optometry) PLACE OF SERVICE:  [facility]   Advanced Directive information    Allergies  Allergen Reactions   Lisinopril Swelling   Mixed Grasses Anaphylaxis   Bee Venom Swelling   Azithromycin Diarrhea   Baclofen Diarrhea   Iodine     CKD   Morphine  And Codeine Nausea And Vomiting   [place] Clam (M. Mercenaria) Skin Test Diarrhea    Mussels.   Sulfa Antibiotics Rash    Chief Complaint  Patient presents with   Shortness of Breath    SOB, started 2 Days ago. EMS went to house yesterday and did a EKG and told her that it was Dehydration.      HPI: Patient is a 89 y.o. female seen in today at [facility] Discussed the use of AI scribe software for clinical note transcription with the patient, who gave verbal consent to proceed.  History of Present Illness Brittney [name] "Brittney [name]" is a 89 year old female with atrial fibrillation and chronic kidney disease who presents with shortness of breath.  She has been experiencing shortness of breath that began two nights ago, which has improved today but persists during activities like walking her dog. No cough, congestion, fever, or chills. She describes back pain that worsens with breathing and chest discomfort. Despite her history of atrial fibrillation, she does not feel any palpitations.  Her medical history includes atrial fibrillation and diastolic dysfunction, which have previously caused episodes of shortness of breath. She recalls a similar episode last year. She does not take her diuretic daily but uses it as needed based on her shortness of breath when walking her dog. She did not take her diuretic yesterday but plans to take it today. She takes metoprolol  daily and has recently restarted allopurinol  for gout due to high uric acid levels and  previous toe pain.  No leg swelling currently, although she did not take her diuretic yesterday. Her weight fluctuates, noting a weight of 154 pounds at night and 148 pounds in the morning, which she attributes to fluid retention.  She denies any difficulty lying flat at night, stating she sleeps on her side with two pillows, which is her usual routine. She reports sleeping well last night except for needing to use the bathroom. No dizziness currently, but she recalls experiencing dizziness in the past when being diuresed.  She has not seen a kidney specialist since moving from [place] , where she had one. Her hemoglobin was noted to be low, and she denies any dark stools or abnormal bleeding. Her iron levels were normal on previous tests.     Review of Systems:  Review of Systems  Constitutional:  Negative for chills, fever, malaise/fatigue and weight loss.  HENT:  Negative for tinnitus.   Respiratory:  Positive for shortness of breath. Negative for cough and sputum production.   Cardiovascular:  Negative for chest pain, palpitations and leg swelling.  Gastrointestinal:  Negative for abdominal pain, constipation, diarrhea and heartburn.  Genitourinary:  Negative for dysuria, frequency and urgency.  Musculoskeletal:  Negative for back pain, falls, joint pain and myalgias.  Skin: Negative.   Neurological:  Negative for dizziness and headaches.  Psychiatric/Behavioral:  Negative for depression and memory loss. The patient does not have insomnia.     Past Medical History:  Diagnosis Date   Adopted person  Allergy    Anemia    Angina pectoris (HCC)    Aortic atherosclerosis (HCC)    Arthritis    Atrial fibrillation (HCC)    a. ) CHA2DS2-VASc = 7 (age x 2, sex, HTN, TIA x2, aortic plaque);  b.) rate/rhythm maintained on oral metoprolol  succinate; chronically anticoagulated using dose reduced apixaban .   Bilateral carotid artery disease (HCC)    CAD (coronary artery disease)    a.)  LHC 02/09/2007: EF 60%, LVEDP 25 mmHg, normal cors; b.) cCTA 11/27/2011: Ca score 129.73 --> 1.17 RCA, 16.36 LM, 112.21 LAD, 0 LCx; c.) MPI 03/13/2014 - normal.   Cataract    CKD (chronic kidney disease), stage IV (HCC)    Diastolic dysfunction 02/09/2008   a.) LHC 02/09/2008: EF 60%, LVEDP 25 mmHg; b.) TTE 08/07/2010: EF 65%, mild LVH, mild MR, G2DD; b.) TTE 08/20/2018: EF 45-50%, inferior HK, mild LAE; c.) TTE 08/11/2021: EF 45-50%. 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   Left carotid bruit    Long term current use of anticoagulant    a.) apixaban    Lumbar stenosis    Lymphocytic colitis    Osteoporosis    TIA (transient ischemic attack) 07/2018   TIA (transient ischemic attack) 08/2021   Vitamin B12 deficiency    Wears hearing BILATERAL aids    Wears partial (bottom) dentures    Past Surgical History:  Procedure Laterality Date   BREAST EXCISIONAL BIOPSY Right 36 years ago   neg   BUNIONECTOMY Bilateral    COLONOSCOPY N/A 07/02/2008   KNEE ARTHROPLASTY Right 03/11/2022   Procedure: COMPUTER ASSISTED TOTAL KNEE ARTHROPLASTY;  Surgeon: Arlyne Lame, MD;  Location: [facility] ORS;  Service: Orthopedics;  Laterality: Right;   KNEE ARTHROSCOPY Right 2006   LEFT HEART CATH AND CORONARY ANGIOGRAPHY Left 02/09/2007   Procedure: LEFT HEART CATH AND CORONARY ANGIOGRAPHY; Location: [facility]   TONSILLECTOMY AND ADENOIDECTOMY Bilateral    TOTAL ABDOMINAL HYSTERECTOMY W/ BILATERAL SALPINGOOPHORECTOMY N/A    TOTAL KNEE ARTHROPLASTY Left 04/26/2014   TUBAL LIGATION     Social History:   reports that she has never smoked. She has never used smokeless tobacco. She reports that she does not currently use alcohol. She reports that she does not use drugs.  Family History  Adopted: Yes  Problem Relation Age of Onset    Heart disease Sister    Stroke Brother     Medications: Patient's Medications  New Prescriptions   No medications on file  Previous Medications   ACETAMINOPHEN  (TYLENOL ) 500 MG TABLET    Take 1,000 mg by mouth 2 (two) times daily as needed.   ALLOPURINOL  (ZYLOPRIM ) 100 MG TABLET    Take 0.5 tablets (50 mg total) by mouth daily.   CHOLECALCIFEROL  (VITAMIN D ) 50 MCG (2000 UT) TABLET    Take 2,000 Units by mouth daily.   CYANOCOBALAMIN  (VITAMIN B12) 1000 MCG TABLET    Take 1,000 mcg by mouth 3 (three) times a week.   EPINEPHRINE  0.3 MG/0.3 ML IJ SOAJ INJECTION    Inject 0.3 mg into the muscle as needed for anaphylaxis.   FLUTICASONE  (FLONASE ) 50 MCG/ACT NASAL SPRAY    Place 2 sprays into both nostrils as needed.   FUROSEMIDE  (LASIX ) 20 MG TABLET    Take 0.5 tablets (10  mg total) by mouth daily as needed (shortness of breath, or swelling, or daily weight gain greater than 3 pounds).   LORATADINE  (CLARITIN ) 10 MG TABLET    Take 10 mg by mouth daily.   METOPROLOL  SUCCINATE (TOPROL -XL) 25 MG 24 HR TABLET    Take 0.5 tablets (12.5 mg total) by mouth daily.   OLOPATADINE  (PATANOL) 0.1 % OPHTHALMIC SOLUTION    Place 1 drop into both eyes 2 (two) times daily as needed for allergies.   XARELTO  15 MG TABS TABLET    TAKE ONE TABLET BY MOUTH EVERY DAY WITH SUPPER  Modified Medications   No medications on file  Discontinued Medications   No medications on file    Physical Exam:  Vitals:   01/11/24 1108  BP: 128/74  Pulse: 81  Temp: (!) 97.2 F (36.2 C)  SpO2: 96%  Weight: 153 lb 9.6 oz (69.7 kg)  Height: 4\' 11"  (1.499 m)   Body mass index is 31.02 kg/m. Wt Readings from Last 3 Encounters:  01/11/24 153 lb 9.6 oz (69.7 kg)  12/23/23 152 lb (68.9 kg)  10/21/23 152 lb (68.9 kg)    Physical Exam Constitutional:      General: She is not in acute distress.    Appearance: She is well-developed. She is not diaphoretic.  HENT:     Head: Normocephalic and atraumatic.     Mouth/Throat:      Pharynx: No oropharyngeal exudate.  Eyes:     Conjunctiva/sclera: Conjunctivae normal.     Pupils: Pupils are equal, round, and reactive to light.  Cardiovascular:     Rate and Rhythm: Normal rate and regular rhythm.     Heart sounds: Normal heart sounds.  Pulmonary:     Effort: Pulmonary effort is normal.     Breath sounds: Normal breath sounds. No decreased breath sounds, wheezing, rhonchi or rales.  Abdominal:     General: Bowel sounds are normal.     Palpations: Abdomen is soft.  Musculoskeletal:     Cervical back: Normal range of motion and neck supple.     Right lower leg: No edema.     Left lower leg: No edema.  Skin:    General: Skin is warm and dry.  Neurological:     Mental Status: She is alert.  Psychiatric:        Mood and Affect: Mood normal.     Labs reviewed: Basic Metabolic Panel: Recent Labs    06/30/23 1223 07/01/23 1232 09/06/23 1406 01/03/24 0729  NA 143  --  143 142  K 5.8* 4.8 4.9 4.7  CL 108*  --  105 108  CO2 21  --  24 28  GLUCOSE 84  --  99 89  BUN 36  --  34 39*  CREATININE 1.54*  --  1.54* 1.47*  CALCIUM  10.2  --  9.8 9.6   Liver Function Tests: Recent Labs    04/12/23 0735 01/03/24 0729  AST 14 18  ALT 6 7  BILITOT 0.8 0.9  PROT 6.2 6.2   No results for input(s): "LIPASE", "AMYLASE" in the last 8760 hours. No results for input(s): "AMMONIA" in the last 8760 hours. CBC: Recent Labs    04/12/23 0735 06/30/23 1223 01/03/24 0729  WBC 6.3 6.4 6.9  NEUTROABS 3,956  --  4,057  HGB 10.9* 11.2 9.6*  HCT 34.0* 34.5 30.3*  MCV 93.9 97 92.4  PLT 432* 391 437*   Lipid Panel: Recent Labs    04/12/23  0735  CHOL 165  HDL 57  LDLCALC 90  TRIG 87  CHOLHDL 2.9   TSH: No results for input(s): "TSH" in the last 8760 hours. A1C: Lab Results  Component Value Date   HGBA1C 5.4 08/20/2018     Assessment/Plan Assessment and Plan Assessment & Plan Shortness of Breath Acute onset, improved but persistent with exertion. No  shortness of breath at this time - she administered lasix  yesterday and instructed to take Lasix  10 mg today and tomorrow. - Order chest x-ray for pulmonary congestion.  Heart Failure with Diastolic Dysfunction Likely contributing to shortness of breath.  She was not able to tolerate lasix  daily due to dizziness - Monitor weight daily.  Atrial Fibrillation Asymptomatic. EKG shows AFib.   Chronic Kidney Disease Kidney function well-managed. Anemia possibly related to kidney function. Normal iron levels. - Consider nephrology referral if anemia worsens or kidney function declines.  Anemia  Noted on labs, no signs of acute blood loss, iron level WNL will follow.   Gout Controlled with allopurinol . No current symptoms. - Continue allopurinol . - follow uric acid levels.  Tivis Wherry K. Denney Fisherman  [facility] [phone]

## 2024-01-11 NOTE — Patient Instructions (Addendum)
 Take lasix  10 mg today and tomorrow.   Go from clinic to-- [facility]  Address: [address], [place], [place] [address] Phone: [phone]

## 2024-01-18 ENCOUNTER — Ambulatory Visit: Admitting: Nurse Practitioner

## 2024-01-18 ENCOUNTER — Encounter: Payer: Self-pay | Admitting: Nurse Practitioner

## 2024-01-18 VITALS — BP 136/74 | HR 78 | Temp 97.0°F | Ht 59.0 in | Wt 153.0 lb

## 2024-01-18 DIAGNOSIS — R0602 Shortness of breath: Secondary | ICD-10-CM

## 2024-01-18 DIAGNOSIS — M109 Gout, unspecified: Secondary | ICD-10-CM

## 2024-01-18 DIAGNOSIS — I4821 Permanent atrial fibrillation: Secondary | ICD-10-CM | POA: Diagnosis not present

## 2024-01-18 DIAGNOSIS — I5032 Chronic diastolic (congestive) heart failure: Secondary | ICD-10-CM | POA: Diagnosis not present

## 2024-01-18 DIAGNOSIS — K219 Gastro-esophageal reflux disease without esophagitis: Secondary | ICD-10-CM

## 2024-01-18 DIAGNOSIS — R198 Other specified symptoms and signs involving the digestive system and abdomen: Secondary | ICD-10-CM

## 2024-01-18 NOTE — Progress Notes (Signed)
 Careteam: Patient Care Team: [name], NP as PCP - General (Geriatric Medicine) [name], MD as PCP - Cardiology (Cardiology) [name], OD (Optometry) PLACE OF SERVICE:  [facility]   Advanced Directive information    Allergies  Allergen Reactions   Lisinopril Swelling   Mixed Grasses Anaphylaxis   Bee Venom Swelling   Azithromycin Diarrhea   Baclofen Diarrhea   Iodine     CKD   Morphine  And Codeine Nausea And Vomiting   [place] Clam (M. Mercenaria) Skin Test Diarrhea    Mussels.   Sulfa Antibiotics Rash    Chief Complaint  Patient presents with   Medical Management of Chronic Issues    Medical Management of Chronic Issues.      HPI: Patient is a 89 y.o. female seen in today at [facility] Discussed the use of AI scribe software for clinical note transcription with the patient, who gave verbal consent to proceed.  History of Present Illness Brittney [name] "Brittney [name]" is a 89 year old female with atrial fibrillation and diastolic dysfunction who presents for follow up on shortness of breath  She has been experiencing shortness of breath  for several weeks. Despite taking Lasix  10 mg daily for three days, she continues to feel out of breath after activities such as showering or walking short distances. Recent chest x-rays indicated cardiomegaly, minimal pleural effusions, and findings suspicious for emphysema, but no significant pulmonary vascular congestion. She has a history of exposure to secondhand smoke from her parents during childhood, although she has never smoked herself.  She has a history of atrial fibrillation and diastolic dysfunction, which has been managed with Lasix  10 mg daily. An echocardiogram from over a year ago showed normal pump function and an improved from previous study. She follows with cardiology every 6 months   She reports gastrointestinal issues, including acid reflux, upset stomachs, and episodes  of severe diarrhea described as 'projectile.' These episodes occur occasionally, with the last one happening a week ago. She uses Benefiber and plans to increase the dose to twice daily to manage these symptoms. She experiences nausea two to three times a week, often in the morning, but denies vomiting. She takes her medication with food, which she believes helps alleviate some symptoms.  She mentions a history of inflammatory colitis and has seen a gastroenterologist in the past. She occasionally uses antidiarrheal medication when symptoms are severe, which leads to constipation for a few days afterward.   Review of Systems:  Review of Systems  Constitutional:  Negative for chills, fever and weight loss.  HENT:  Negative for tinnitus.   Respiratory:  Positive for shortness of breath. Negative for cough and sputum production.   Cardiovascular:  Negative for chest pain, palpitations and leg swelling.  Gastrointestinal:  Positive for diarrhea (occasionally) and nausea (occasionally). Negative for abdominal pain, constipation and heartburn.  Genitourinary:  Negative for dysuria, frequency and urgency.  Musculoskeletal:  Negative for back pain, falls, joint pain and myalgias.  Skin: Negative.   Neurological:  Negative for dizziness and headaches.  Psychiatric/Behavioral:  Negative for depression and memory loss. The patient does not have insomnia.     Past Medical History:  Diagnosis Date   Adopted person    Allergy    Anemia    Angina pectoris (HCC)    Aortic atherosclerosis (HCC)    Arthritis    Atrial fibrillation (HCC)    a. ) CHA2DS2-VASc = 7 (age x 2,  sex, HTN, TIA x2, aortic plaque);  b.) rate/rhythm maintained on oral metoprolol  succinate; chronically anticoagulated using dose reduced apixaban .   Bilateral carotid artery disease (HCC)    CAD (coronary artery disease)    a.) LHC 02/09/2007: EF 60%, LVEDP 25 mmHg, normal cors; b.) cCTA 11/27/2011: Ca score 129.73 --> 1.17 RCA, 16.36  LM, 112.21 LAD, 0 LCx; c.) MPI 03/13/2014 - normal.   Cataract    CKD (chronic kidney disease), stage IV (HCC)    Diastolic dysfunction 02/09/2008   a.) LHC 02/09/2008: EF 60%, LVEDP 25 mmHg; b.) TTE 08/07/2010: EF 65%, mild LVH, mild MR, G2DD; b.) TTE 08/20/2018: EF 45-50%, inferior HK, mild LAE; c.) TTE 08/11/2021: EF 45-50%. 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   Left carotid bruit    Long term current use of anticoagulant    a.) apixaban    Lumbar stenosis    Lymphocytic colitis    Osteoporosis    TIA (transient ischemic attack) 07/2018   TIA (transient ischemic attack) 08/2021   Vitamin B12 deficiency    Wears hearing BILATERAL aids    Wears partial (bottom) dentures    Past Surgical History:  Procedure Laterality Date   BREAST EXCISIONAL BIOPSY Right 36 years ago   neg   BUNIONECTOMY Bilateral    COLONOSCOPY N/A 07/02/2008   KNEE ARTHROPLASTY Right 03/11/2022   Procedure: COMPUTER ASSISTED TOTAL KNEE ARTHROPLASTY;  Surgeon: Arlyne Lame, MD;  Location: [facility] ORS;  Service: Orthopedics;  Laterality: Right;   KNEE ARTHROSCOPY Right 2006   LEFT HEART CATH AND CORONARY ANGIOGRAPHY Left 02/09/2007   Procedure: LEFT HEART CATH AND CORONARY ANGIOGRAPHY; Location: [facility]   TONSILLECTOMY AND ADENOIDECTOMY Bilateral    TOTAL ABDOMINAL HYSTERECTOMY W/ BILATERAL SALPINGOOPHORECTOMY N/A    TOTAL KNEE ARTHROPLASTY Left 04/26/2014   TUBAL LIGATION     Social History:   reports that she has never smoked. She has never used smokeless tobacco. She reports that she does not currently use alcohol. She reports that she does not use drugs.  Family History  Adopted: Yes  Problem Relation Age of Onset   Heart disease Sister    Stroke Brother     Medications: Patient's Medications  New Prescriptions   No  medications on file  Previous Medications   ACETAMINOPHEN  (TYLENOL ) 500 MG TABLET    Take 1,000 mg by mouth 2 (two) times daily as needed.   ALLOPURINOL  (ZYLOPRIM ) 100 MG TABLET    Take 0.5 tablets (50 mg total) by mouth daily.   CHOLECALCIFEROL  (VITAMIN D ) 50 MCG (2000 UT) TABLET    Take 2,000 Units by mouth daily.   CYANOCOBALAMIN  (VITAMIN B12) 1000 MCG TABLET    Take 1,000 mcg by mouth 3 (three) times a week.   EPINEPHRINE  0.3 MG/0.3 ML IJ SOAJ INJECTION    Inject 0.3 mg into the muscle as needed for anaphylaxis.   FLUTICASONE  (FLONASE ) 50 MCG/ACT NASAL SPRAY    Place 2 sprays into both nostrils as needed.   FUROSEMIDE  (LASIX ) 20 MG TABLET    Take 0.5 tablets (10 mg total) by mouth daily as needed (shortness of breath, or swelling, or daily weight gain greater than 3 pounds).   LORATADINE  (CLARITIN ) 10 MG TABLET    Take 10 mg by mouth daily.  METOPROLOL  SUCCINATE (TOPROL -XL) 25 MG 24 HR TABLET    Take 0.5 tablets (12.5 mg total) by mouth daily.   OLOPATADINE  (PATANOL) 0.1 % OPHTHALMIC SOLUTION    Place 1 drop into both eyes 2 (two) times daily as needed for allergies.   XARELTO  15 MG TABS TABLET    TAKE ONE TABLET BY MOUTH EVERY DAY WITH SUPPER  Modified Medications   No medications on file  Discontinued Medications   No medications on file    Physical Exam:  Vitals:   01/18/24 1000  BP: 136/74  Pulse: 78  Temp: (!) 97 F (36.1 C)  SpO2: 99%  Weight: 153 lb (69.4 kg)  Height: 4\' 11"  (1.499 m)   Body mass index is 30.9 kg/m. Wt Readings from Last 3 Encounters:  01/18/24 153 lb (69.4 kg)  01/11/24 153 lb 9.6 oz (69.7 kg)  12/23/23 152 lb (68.9 kg)    Physical Exam Constitutional:      General: She is not in acute distress.    Appearance: She is well-developed. She is not diaphoretic.  HENT:     Head: Normocephalic and atraumatic.     Mouth/Throat:     Pharynx: No oropharyngeal exudate.  Eyes:     Conjunctiva/sclera: Conjunctivae normal.     Pupils: Pupils are equal,  round, and reactive to light.  Cardiovascular:     Rate and Rhythm: Normal rate and regular rhythm.     Heart sounds: Normal heart sounds.  Pulmonary:     Effort: Pulmonary effort is normal.     Breath sounds: Normal breath sounds.  Abdominal:     General: Bowel sounds are normal.     Palpations: Abdomen is soft.  Musculoskeletal:     Cervical back: Normal range of motion and neck supple.     Right lower leg: No edema.     Left lower leg: No edema.  Skin:    General: Skin is warm and dry.  Neurological:     Mental Status: She is alert.  Psychiatric:        Mood and Affect: Mood normal.    Labs reviewed: Basic Metabolic Panel: Recent Labs    06/30/23 1223 07/01/23 1232 09/06/23 1406 01/03/24 0729  NA 143  --  143 142  K 5.8* 4.8 4.9 4.7  CL 108*  --  105 108  CO2 21  --  24 28  GLUCOSE 84  --  99 89  BUN 36  --  34 39*  CREATININE 1.54*  --  1.54* 1.47*  CALCIUM  10.2  --  9.8 9.6   Liver Function Tests: Recent Labs    04/12/23 0735 01/03/24 0729  AST 14 18  ALT 6 7  BILITOT 0.8 0.9  PROT 6.2 6.2   No results for input(s): "LIPASE", "AMYLASE" in the last 8760 hours. No results for input(s): "AMMONIA" in the last 8760 hours. CBC: Recent Labs    04/12/23 0735 06/30/23 1223 01/03/24 0729  WBC 6.3 6.4 6.9  NEUTROABS 3,956  --  4,057  HGB 10.9* 11.2 9.6*  HCT 34.0* 34.5 30.3*  MCV 93.9 97 92.4  PLT 432* 391 437*   Lipid Panel: Recent Labs    04/12/23 0735  CHOL 165  HDL 57  LDLCALC 90  TRIG 87  CHOLHDL 2.9   TSH: No results for input(s): "TSH" in the last 8760 hours. A1C: Lab Results  Component Value Date   HGBA1C 5.4 08/20/2018   DG Chest 2 View Result Date:  01/11/2024 CLINICAL DATA:  Shortness of breath. Chest congestion for weeks. CT renal allergies. EXAM: CHEST - 2 VIEW COMPARISON:  04/08/2023 FINDINGS: Unchanged cardiomegaly. No significant pulmonary vascular congestion. Strandy opacities in the medial LEFT lung base favored to be  atelectasis rather than pneumonia. There is blunting of the lateral costophrenic angles bilaterally suspicious for minimal pleural effusions. Flattening of the diaphragm indicative of emphysema. IMPRESSION: 1. Strandy opacities in the medial LEFT lung base favored to be atelectasis rather than pneumonia. 2. Blunted costophrenic angles suspicious for minimal pleural effusions. Electronically Signed   By: Elester Grim M.D.   On: 01/11/2024 14:01     Assessment/Plan 1. Diastolic dysfunction with chronic heart failure (HCC) (Primary) Stable at this time, continues on lasix  10 mg daily Weight has remained stable without worsening LE edema  2. Shortness of breath Continues to be short of breath with exertion, chest xray with suspected Emphysema and reports long standing smoking exposure in the past - Ambulatory referral to Pulmonology for ongoing evaluation of shortness of breath  3. Gout, unspecified cause, unspecified chronicity, unspecified site Tolerating allopurinol , no recent flares  4. Permanent atrial fibrillation (HCC) -rate controlled on metoprolol . Continues on xarelto  for anticoagulation  5. Gastroesophageal reflux disease without esophagitis Stable, continue dietary modifications   6. Alternating constipation and diarrhea Improved with benefiber, to increase to twice daily  Add probiotic daily    Next appt: 04/20/2024 as scheduled, sooner if needed  Keyshawna Prouse K. Denney Fisherman  [facility] [phone]

## 2024-01-18 NOTE — Addendum Note (Signed)
 Addended by: [name] on: 01/18/2024 02:53 PM   Modules accepted: Level of Service

## 2024-01-18 NOTE — Patient Instructions (Addendum)
 Call cardiologist to set up your 6 month follow up.   Increase benefiber twice daily Take probiotic daily

## 2024-01-20 ENCOUNTER — Telehealth: Payer: Self-pay

## 2024-01-20 NOTE — Telephone Encounter (Signed)
 Okay, thank you for the update

## 2024-01-20 NOTE — Telephone Encounter (Signed)
 Patient stated that Brittney [name] stomach problems is not clearing up. Stated that she called Dr. Russ Course office and the soonest they could get Brittney [name] in was December.   Patient is going to call back and be placed on a Cancellation list and will call our office if symptoms worsen.

## 2024-01-20 NOTE — Telephone Encounter (Signed)
 Copied from CRM [id number]. Topic: Referral - Status >> Jan 18, 2024  4:41 PM Tiffany H wrote: Reason for CRM: Patient called to request assistance finding a sooner appointment for the gastroenterologist. Patient has contacted Dr. Olivia Bevel, whose first appointment in [place] isn't until December. Appointment would be with Christianne London PA. Patient would like a sooner appointment, if possible.   Patient is not open to alternate doctors if she cannot get in with Dr. Emerick Hanlon sooner. Please assist.

## 2024-03-02 ENCOUNTER — Ambulatory Visit: Admitting: Student in an Organized Health Care Education/Training Program

## 2024-03-06 ENCOUNTER — Encounter: Payer: Self-pay | Admitting: Physician Assistant

## 2024-03-06 ENCOUNTER — Ambulatory Visit: Attending: Physician Assistant | Admitting: Physician Assistant

## 2024-03-06 ENCOUNTER — Ambulatory Visit: Admitting: Cardiology

## 2024-03-06 VITALS — BP 118/70 | HR 72 | Ht 59.0 in | Wt 150.6 lb

## 2024-03-06 DIAGNOSIS — I340 Nonrheumatic mitral (valve) insufficiency: Secondary | ICD-10-CM | POA: Insufficient documentation

## 2024-03-06 DIAGNOSIS — I4821 Permanent atrial fibrillation: Secondary | ICD-10-CM | POA: Insufficient documentation

## 2024-03-06 DIAGNOSIS — I5189 Other ill-defined heart diseases: Secondary | ICD-10-CM | POA: Diagnosis not present

## 2024-03-06 DIAGNOSIS — E785 Hyperlipidemia, unspecified: Secondary | ICD-10-CM | POA: Insufficient documentation

## 2024-03-06 DIAGNOSIS — I10 Essential (primary) hypertension: Secondary | ICD-10-CM | POA: Insufficient documentation

## 2024-03-06 DIAGNOSIS — D638 Anemia in other chronic diseases classified elsewhere: Secondary | ICD-10-CM | POA: Diagnosis present

## 2024-03-06 DIAGNOSIS — Z789 Other specified health status: Secondary | ICD-10-CM | POA: Diagnosis present

## 2024-03-06 DIAGNOSIS — G459 Transient cerebral ischemic attack, unspecified: Secondary | ICD-10-CM | POA: Insufficient documentation

## 2024-03-06 DIAGNOSIS — N1832 Chronic kidney disease, stage 3b: Secondary | ICD-10-CM | POA: Insufficient documentation

## 2024-03-06 NOTE — Patient Instructions (Signed)
 Medication Instructions:  Your physician recommends that you continue on your current medications as directed. Please refer to the Current Medication list given to you today.   *If you need a refill on your cardiac medications before your next appointment, please call your pharmacy*  Lab Work: Your provider would like for you to have following labs drawn today CBC and BMeT.   If you have labs (blood work) drawn today and your tests are completely normal, you will receive your results only by: MyChart Message (if you have MyChart) OR A paper copy in the mail If you have any lab test that is abnormal or we need to change your treatment, we will call you to review the results.  Follow-Up: At [facility], you and your health needs are our priority.  As part of our continuing mission to provide you with exceptional heart care, our providers are all part of one team.  This team includes your primary Cardiologist (physician) and Advanced Practice Providers or APPs (Physician Assistants and Nurse Practitioners) who all work together to provide you with the care you need, when you need it.  Your next appointment:   6 month(s)  Provider:   You may see Redell Cave, MD or Bernardino Bring, PA-C

## 2024-03-06 NOTE — Progress Notes (Signed)
 Cardiology Office Note    Date:  03/06/2024   ID:  Brittney [name], DOB 07/23/1930, MRN [id number]  PCP:  [name], NP  Cardiologist:  Redell Cave, MD  Electrophysiologist:  None   Chief Complaint: Follow up  History of Present Illness:   Brittney [name] is a 89 y.o. female with history of permanent A-fib, systolic dysfunction, TIA, CKD stage IIIb with anemia of chronic disease, HTN, HLD, and lumbar radiculitis who presents for follow-up of A-fib.   She was previously followed by [facility], and underwent cardiac cath in 2008 which showed no angiographically apparent CAD in the major epicardial arteries.  Calcium  score in 2013 of 129.73.  Nuclear stress test in 2015 showed no evidence of ischemia with normal LV function.  She was admitted to the hospital in 2019 with a suspected TIA.  Carotid artery ultrasound at that time showed no hemodynamically significant stenosis in the bilateral carotid arteries with antegrade flow of the bilateral vertebral arteries.  Echo demonstrated an EF of 45 to 50% with hypokinesis of the inferior myocardium and a mildly dilated left atrium.  Notes indicate she was diagnosed with A-fib in 10/2020, and initiated on apixaban .  She established care with our group in 2022.  Subsequent echo in 07/2021 showed a stable LV systolic function with an EF of 45 to 50%, no regional wall motion abnormalities, indeterminate LV diastolic function parameters, normal RV systolic function and ventricular cavity size, severely dilated left atrium, mildly dilated right atrium, mild to moderate mitral regurgitation, mild to moderate tricuspid regurgitation, and an estimated right atrial pressure of 8 mmHg.  She was seen in our office in 08/2021 and reported a possible TIA several weeks prior.  She was initiated on aspirin  with continuation of apixaban  and referred to neurology with recommendation to be maintained on apixaban  and aspirin .  MRI of the brain showed no  evidence of acute intracranial abnormality with mild to moderate chronic small vessel ischemic changes that had progressed from study in 2019 as well as mild generalized cerebral atrophy.  She was seen in the office in 08/2022 noting a several month history of exertional shortness of breath when walking up an incline without frank chest pain.  Symptoms were largely unchanged when compared to her prior visit and would last for a couple of seconds with spontaneous resolution.  She also wondered if rosuvastatin  was contributing to some of her lower extremity discomfort.  In this setting, she underwent echo in 09/2022 which showed an EF of 60 to 65%, no regional wall motion abnormalities, mild LVH, grade 2 diastolic dysfunction, normal RV systolic function and ventricular cavity size, mildly elevated RVSP at 38.8 mmHg, moderately dilated left atrium, mild to moderate mitral regurgitation, mild to moderate tricuspid regurgitation, mild aortic insufficiency, and an estimated right atrial pressure of 3 mmHg.   She was evaluated at her PCP's office on 04/08/2023 noting an increase in fatigue and dyspnea with associated chest discomfort.  Reported ankle swelling at the end of the day.  Chest x-ray was obtained which demonstrated vascular congestion.  In this setting, primary cardiologist initiated the patient on furosemide  20 mg daily.  However, patient subsequently notified our office that she was experiencing dizziness with initiation of furosemide .  With this, furosemide  was held on 04/28/2023.   She was seen in the office on 04/29/2023 noting an increase in dizziness following initiation of furosemide .  She felt like her breathing was overall stable with continued shortness of breath and chest  heaviness with ambulation that was longstanding.  There was no significant lower extremity swelling.  Toprol  was reduced to 12.5 mg daily with recommendation to continue to hold furosemide .  To further evaluate her longstanding  exertional dyspnea, she underwent Lexiscan  MPI on 05/07/2023 that showed no evidence of ischemia or infarction with normal LV systolic function and was overall low risk.  CT attenuated corrected images demonstrated coronary artery calcification and aortic atherosclerosis.   She was seen in the office on 06/30/2023 noting unchanged chronic dyspnea.  Orthostatic dizziness improved following reduction of Toprol -XL and discontinuation of scheduled furosemide .   She was seen in the ED on 09/03/2023 after sustaining a mechanical fall, attempting to sit down at her kitchen [name] when the chair slid out from underneath her.  CT head showed no acute intracranial process.  CT cervical spine showed no acute fracture or traumatic finding.  She was last seen in the office in 08/2023 and felt like her chronic dyspnea was a little improved.  She was taking furosemide  10 mg once daily dating back to November 2024.  Off rosuvastatin , she noted some improvement in myalgias, though not resolution.  More recently, she has been followed by PCP for chronic shortness of breath with chest x-ray in 12/2023 showing suspected atelectasis and possible minimal pleural effusions.  CBC obtained at that time showed a downtrending hemoglobin of 9.6.  She comes in today noting chronic stable shortness of breath without frank angina.  No dizziness, presyncope, or syncope.  Weight stable swelling or progressive orthopnea.  She self discontinued furosemide  approximately 2 months prior indicating she felt like this was contributing to abdominal discomfort with concern for pancreatitis.  Since discontinuing furosemide  her abdominal discomfort has resolved.  Despite being off of diuretic, she has not had any progressive lower extremity swelling, orthopnea, or change in baseline chronic dyspnea.  She denies any falls, hematochezia, melena, hemoptysis, hematemesis, or hematuria.   Labs independently reviewed: 12/2023 - BUN 39, serum creatinine  1.47, potassium 4.7, albumin 4.1, AST/ALT normal, Hgb 9.6, PLT 437 03/2023 - TC 165, TG 87, HDL 57, LDL 90 08/2021 - TSH normal  Past Medical History:  Diagnosis Date   Adopted person    Allergy    Anemia    Angina pectoris (HCC)    Aortic atherosclerosis (HCC)    Arthritis    Atrial fibrillation (HCC)    a. ) CHA2DS2-VASc = 7 (age x 2, sex, HTN, TIA x2, aortic plaque);  b.) rate/rhythm maintained on oral metoprolol  succinate; chronically anticoagulated using dose reduced apixaban .   Bilateral carotid artery disease (HCC)    CAD (coronary artery disease)    a.) LHC 02/09/2007: EF 60%, LVEDP 25 mmHg, normal cors; b.) cCTA 11/27/2011: Ca score 129.73 --> 1.17 RCA, 16.36 LM, 112.21 LAD, 0 LCx; c.) MPI 03/13/2014 - normal.   Cataract    CKD (chronic kidney disease), stage IV (HCC)    Diastolic dysfunction 02/09/2008   a.) LHC 02/09/2008: EF 60%, LVEDP 25 mmHg; b.) TTE 08/07/2010: EF 65%, mild LVH, mild MR, G2DD; b.) TTE 08/20/2018: EF 45-50%, inferior HK, mild LAE; c.) TTE 08/11/2021: EF 45-50%. 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   Left carotid bruit    Long term current use of anticoagulant    a.) apixaban   Lumbar stenosis    Lymphocytic colitis    Osteoporosis    TIA (transient ischemic attack) 07/2018   TIA (transient ischemic attack) 08/2021   Vitamin B12 deficiency    Wears hearing BILATERAL aids    Wears partial (bottom) dentures     Past Surgical History:  Procedure Laterality Date   BREAST EXCISIONAL BIOPSY Right 36 years ago   neg   BUNIONECTOMY Bilateral    COLONOSCOPY N/A 07/02/2008   KNEE ARTHROPLASTY Right 03/11/2022   Procedure: COMPUTER ASSISTED TOTAL KNEE ARTHROPLASTY;  Surgeon: Mardee Lynwood SQUIBB, MD;  Location: [facility] ORS;  Service: Orthopedics;  Laterality: Right;   KNEE ARTHROSCOPY Right  2006   LEFT HEART CATH AND CORONARY ANGIOGRAPHY Left 02/09/2007   Procedure: LEFT HEART CATH AND CORONARY ANGIOGRAPHY; Location: [facility]   TONSILLECTOMY AND ADENOIDECTOMY Bilateral    TOTAL ABDOMINAL HYSTERECTOMY W/ BILATERAL SALPINGOOPHORECTOMY N/A    TOTAL KNEE ARTHROPLASTY Left 04/26/2014   TUBAL LIGATION      Current Medications: Current Meds  Medication Sig   acetaminophen  (TYLENOL ) 500 MG tablet Take 1,000 mg by mouth 2 (two) times daily as needed.   allopurinol  (ZYLOPRIM ) 100 MG tablet Take 0.5 tablets (50 mg total) by mouth daily.   Cholecalciferol  (VITAMIN D ) 50 MCG (2000 UT) tablet Take 2,000 Units by mouth daily.   cyanocobalamin  (VITAMIN B12) 1000 MCG tablet Take 1,000 mcg by mouth 3 (three) times a week.   EPINEPHrine  0.3 mg/0.3 mL IJ SOAJ injection Inject 0.3 mg into the muscle as needed for anaphylaxis.   fluticasone  (FLONASE ) 50 MCG/ACT nasal spray Place 2 sprays into both nostrils as needed.   loratadine  (CLARITIN ) 10 MG tablet Take 10 mg by mouth daily.   metoprolol  succinate (TOPROL -XL) 25 MG 24 hr tablet Take 0.5 tablets (12.5 mg total) by mouth daily.   olopatadine  (PATANOL) 0.1 % ophthalmic solution Place 1 drop into both eyes 2 (two) times daily as needed for allergies.   XARELTO  15 MG TABS tablet TAKE ONE TABLET BY MOUTH EVERY DAY WITH SUPPER    Allergies:   Lisinopril, Mixed grasses, Bee venom, Azithromycin, Baclofen, Iodine, Morphine  and codeine, [place] quahog clam (m. mercenaria) skin test, and Sulfa antibiotics   Social History   Socioeconomic History   Marital status: Widowed    Spouse name: Not on file   Number of children: Not on file   Years of education: Not on file   Highest education level: Not on file  Occupational History   Not on file  Tobacco Use   Smoking status: Never   Smokeless tobacco: Never  Vaping Use   Vaping status: Never Used  Substance and Sexual Activity   Alcohol use: Not Currently   Drug use: Never   Sexual  activity: Not Currently  Other Topics Concern   Not on file  Social History Narrative   Not on file   Social Drivers of Health   Financial Resource Strain: Not on file  Food Insecurity: No Food Insecurity (01/18/2024)   Hunger Vital Sign    Worried About Running Out of Food in the Last Year: Never true    Ran Out of Food in the Last Year: Never true  Transportation Needs: No Transportation Needs (01/18/2024)   PRAPARE - Administrator, Civil Service (Medical): No    Lack of Transportation (Non-Medical): No  Physical Activity: Not on file  Stress: Not on file  Social Connections: Not on file     Family History:  The  patient's family history includes Heart disease in her sister; Stroke in her brother. She was adopted.  ROS:   12-point review of systems is negative unless otherwise noted in the HPI.   EKGs/Labs/Other Studies Reviewed:    Studies reviewed were summarized above. The additional studies were reviewed today:  Lexiscan  MPI 05/07/2023:   The study is normal. The study is low risk.   No ST deviation was noted.   LV perfusion is normal. There is no evidence of ischemia. There is no evidence of infarction.   Left ventricular function is normal. End diastolic cavity size is normal. End systolic cavity size is normal.   CT attenuation images show evidence of aortic and coronary calcifications. __________   2D echo 10/02/2022: 1. Left ventricular ejection fraction, by estimation, is 60 to 65%. The  left ventricle has normal function. The left ventricle has no regional  wall motion abnormalities. There is mild left ventricular hypertrophy.  Left ventricular diastolic parameters  are consistent with Grade II diastolic dysfunction (pseudonormalization).  The average left ventricular global longitudinal strain is -8.0 %. The  global longitudinal strain is abnormal.   2. Right ventricular systolic function is normal. The right ventricular  size is normal. There is  mildly elevated pulmonary artery systolic  pressure. The estimated right ventricular systolic pressure is 38.8 mmHg.   3. Left atrial size was moderately dilated.   4. The mitral valve is normal in structure. Mild to moderate mitral valve  regurgitation. No evidence of mitral stenosis.   5. Tricuspid valve regurgitation is mild to moderate.   6. The aortic valve is tricuspid. Aortic valve regurgitation is mild. No  aortic stenosis is present.   7. The inferior vena cava is normal in size with greater than 50%  respiratory variability, suggesting right atrial pressure of 3 mmHg.  __________   2D echo 08/11/2021: 1. Left ventricular ejection fraction, by estimation, is 45 to 50%. The  left ventricle has mildly decreased function. The left ventricle has no  regional wall motion abnormalities. Left ventricular diastolic parameters  are indeterminate.   2. Right ventricular systolic function is normal. The right ventricular  size is normal.   3. Left atrial size was severely dilated.   4. Right atrial size was mildly dilated.   5. The mitral valve is normal in structure. Mild to moderate mitral valve  regurgitation.   6. Tricuspid valve regurgitation is mild to moderate.   7. The aortic valve is tricuspid. Aortic valve regurgitation is not  visualized.   8. The inferior vena cava is normal in size with <50% respiratory  variability, suggesting right atrial pressure of 8 mmHg.  __________   2D echo 08/20/2018: - Left ventricle: The cavity size was mildly dilated. Wall    thickness was normal. Systolic function was mildly reduced. The    estimated ejection fraction was in the range of 45% to 50%.    Hypokinesis of the inferior myocardium.  - Left atrium: The atrium was mildly dilated.   Impressions:   - Mildly reduced LVF    Inferior Hypokinesis    EF=45-50%    Normal Right side. No cardiac source of emboli was indentified.  __________   Carotid artery ultrasound  08/19/2018: IMPRESSION: No hemodynamically significant stenosis is noted in either cervical carotid artery.   EKG:  EKG is ordered today.  The EKG ordered today demonstrates A-fib, 72 bpm, incomplete RBBB, consistent with prior tracing  Recent Labs: 01/03/2024: ALT 7; BUN 39; Creat  1.47; Hemoglobin 9.6; Platelets 437; Potassium 4.7; Sodium 142  Recent Lipid Panel    Component Value Date/Time   CHOL 165 04/12/2023 0735   TRIG 87 04/12/2023 0735   HDL 57 04/12/2023 0735   CHOLHDL 2.9 04/12/2023 0735   VLDL 21 09/04/2022 1129   LDLCALC 90 04/12/2023 0735   LDLDIRECT 58 09/04/2022 1129    PHYSICAL EXAM:    VS:  BP 118/70 (BP Location: Left Arm, Patient Position: Sitting, Cuff Size: Normal)   Pulse 72   Ht 4' 11 (1.499 m)   Wt 150 lb 9.6 oz (68.3 kg)   SpO2 99%   BMI 30.42 kg/m   BMI: Body mass index is 30.42 kg/m.  Physical Exam Vitals reviewed.  Constitutional:      Appearance: She is well-developed.  HENT:     Head: Normocephalic and atraumatic.  Eyes:     General:        Right eye: No discharge.        Left eye: No discharge.  Cardiovascular:     Rate and Rhythm: Normal rate. Rhythm irregularly irregular.     Pulses:          Posterior tibial pulses are 2+ on the right side and 2+ on the left side.     Heart sounds: S1 normal and S2 normal. Heart sounds not distant. No midsystolic click and no opening snap. Murmur heard.     Systolic murmur is present with a grade of 1/6 at the upper left sternal border.     No friction rub.  Pulmonary:     Effort: Pulmonary effort is normal. No respiratory distress.     Breath sounds: Normal breath sounds. No decreased breath sounds, wheezing, rhonchi or rales.  Chest:     Chest wall: No tenderness.  Musculoskeletal:     Cervical back: Normal range of motion.     Right lower leg: No edema.     Left lower leg: No edema.  Skin:    General: Skin is warm and dry.     Nails: There is no clubbing.  Neurological:     Mental  Status: She is alert and oriented to person, place, and time.  Psychiatric:        Speech: Speech normal.        Behavior: Behavior normal.        Thought Content: Thought content normal.        Judgment: Judgment normal.     Wt Readings from Last 3 Encounters:  03/06/24 150 lb 9.6 oz (68.3 kg)  01/18/24 153 lb (69.4 kg)  01/11/24 153 lb 9.6 oz (69.7 kg)     ASSESSMENT & PLAN:   Permanent A-fib: Well-controlled ventricular response on Toprol -XL 12.5 mg daily.  CHA2DS2-VASc at least 7 (HTN, age x 2, TIA x 2, vascular disease, sex category).  She remains on rivaroxaban  15 mg daily with a creatinine clearance of 26.2.  No falls or symptoms concerning for bleeding.  Check CBC and BMP.  Grade 2 diastolic dysfunction with chronic dyspnea: Euvolemic and well compensated.  Lexiscan  MPI less than 12 months ago showed no evidence of high risk ischemia with echo showing preserved LV systolic function with grade 2 diastolic dysfunction and mildly elevated RVSP.  Query if her dyspnea is also related to progressive anemia.  She self discontinued furosemide  believing this led to pancreatitis, this was not evaluated.  Furosemide  associated pancreatitis is quite rare.  She will need to be monitored off  diuretic therapy.  Continue optimal blood pressure control.  HTN: Blood pressure is well-controlled in the office today.  She remains on Toprol -XL 12.5 mg.  HLD with statin intolerance: LDL 98 03/2023.  No longer on statin secondary to myalgia.  Mitral regurgitation: Mild to moderate by echo in 09/2022.  Monitor with periodic echo.  History of TIA: No new deficits.  Remains on renally dosed rivaroxaban .  CKD stage IIIb with worsening normocytic anemia: Check BMP and a CBC.    Disposition: F/u with Dr. Gearld or an APP in 6 months.   Medication Adjustments/Labs and Tests Ordered: Current medicines are reviewed at length with the patient today.  Concerns regarding medicines are outlined above.  Medication changes, Labs and Tests ordered today are summarized above and listed in the Patient Instructions accessible in Encounters.   Signed, Bernardino Bring, PA-C 03/06/2024 12:38 PM     [facility] -  [address] [place], [place] [address] [phone]

## 2024-03-07 ENCOUNTER — Ambulatory Visit: Payer: Self-pay | Admitting: Physician Assistant

## 2024-03-07 DIAGNOSIS — N1832 Chronic kidney disease, stage 3b: Secondary | ICD-10-CM

## 2024-03-07 DIAGNOSIS — D638 Anemia in other chronic diseases classified elsewhere: Secondary | ICD-10-CM

## 2024-03-07 LAB — CBC
Hematocrit: 32.4 % — ABNORMAL LOW (ref 34.0–46.6)
Hemoglobin: 10 g/dL — ABNORMAL LOW (ref 11.1–15.9)
MCH: 28.5 pg (ref 26.6–33.0)
MCHC: 30.9 g/dL — ABNORMAL LOW (ref 31.5–35.7)
MCV: 92 fL (ref 79–97)
Platelets: 485 x10E3/uL — ABNORMAL HIGH (ref 150–450)
RBC: 3.51 x10E6/uL — ABNORMAL LOW (ref 3.77–5.28)
RDW: 14.4 % (ref 11.7–15.4)
WBC: 6 x10E3/uL (ref 3.4–10.8)

## 2024-03-07 LAB — BASIC METABOLIC PANEL WITH GFR
BUN/Creatinine Ratio: 20 (ref 12–28)
BUN: 32 mg/dL (ref 10–36)
CO2: 19 mmol/L — ABNORMAL LOW (ref 20–29)
Calcium: 10 mg/dL (ref 8.7–10.3)
Chloride: 106 mmol/L (ref 96–106)
Creatinine, Ser: 1.6 mg/dL — ABNORMAL HIGH (ref 0.57–1.00)
Glucose: 81 mg/dL (ref 70–99)
Potassium: 5.2 mmol/L (ref 3.5–5.2)
Sodium: 141 mmol/L (ref 134–144)
eGFR: 30 mL/min/1.73 — ABNORMAL LOW (ref >59)

## 2024-03-22 ENCOUNTER — Encounter: Payer: Self-pay | Admitting: Student in an Organized Health Care Education/Training Program

## 2024-03-22 ENCOUNTER — Ambulatory Visit: Admitting: Student in an Organized Health Care Education/Training Program

## 2024-03-22 VITALS — BP 118/70 | HR 76 | Temp 98.0°F | Ht 59.0 in | Wt 153.2 lb

## 2024-03-22 DIAGNOSIS — R0602 Shortness of breath: Secondary | ICD-10-CM | POA: Diagnosis not present

## 2024-03-22 NOTE — Progress Notes (Unsigned)
 Synopsis: Referred in for shortness of breath by [name], NP  Assessment & Plan:   1. Shortness of breath (Primary)  Presents for the evaluation of shortness of breath with exertional over the past few months that appears to be worse when laying flat. Her workup thus far has included a chest xray showing kyphosis and an echocardiogram showing mitral regurgitation. Physical exam is reassuring today without rales or lower extremity swelling to suggest heart failure. She reports a hiatal hernia, and I was able to appreciate that on the CT images from her NM stress test.  Differential diagnosis includes worsening hiatal hernia, worsening mitral regurgitation, and ILD. Doubt reactive or obstructive lung disease based on history.  Will initiate a workup to include full PFT's, repeating the echocardiogram, a swallowing study to assess the hiatal hernia, and a high resolution chest CT to evaluate for ILD.  - Pulmonary Function Test; Future - ECHOCARDIOGRAM COMPLETE; Future - CT CHEST HIGH RESOLUTION; Future - DG ESOPHAGUS W DOUBLE CM (HD); Future   Return in about 2 months (around 05/23/2024).  I spent 60 minutes caring for this patient today, including preparing to see the patient, obtaining a medical history , reviewing a separately obtained history, performing a medically appropriate examination and/or evaluation, counseling and educating the patient/family/caregiver, ordering medications, tests, or procedures, documenting clinical information in the electronic health record, and independently interpreting results (not separately reported/billed) and communicating results to the patient/family/caregiver  Belva November, MD [facility]  End of visit medications:  No orders of the defined types were placed in this encounter.    Current Outpatient Medications:    acetaminophen  (TYLENOL ) 500 MG tablet, Take 1,000 mg by mouth 2 (two) times daily as needed.,  Disp: , Rfl:    allopurinol  (ZYLOPRIM ) 100 MG tablet, Take 0.5 tablets (50 mg total) by mouth daily., Disp: 15 tablet, Rfl: 6   Cholecalciferol  (VITAMIN D ) 50 MCG (2000 UT) tablet, Take 2,000 Units by mouth daily., Disp: , Rfl:    cyanocobalamin  (VITAMIN B12) 1000 MCG tablet, Take 1,000 mcg by mouth 3 (three) times a week., Disp: , Rfl:    EPINEPHrine  0.3 mg/0.3 mL IJ SOAJ injection, Inject 0.3 mg into the muscle as needed for anaphylaxis., Disp: 1 each, Rfl: 3   fluticasone  (FLONASE ) 50 MCG/ACT nasal spray, Place 2 sprays into both nostrils as needed., Disp: 16 g, Rfl: 1   loratadine  (CLARITIN ) 10 MG tablet, Take 10 mg by mouth daily., Disp: , Rfl:    metoprolol  succinate (TOPROL -XL) 25 MG 24 hr tablet, Take 0.5 tablets (12.5 mg total) by mouth daily., Disp: 45 tablet, Rfl: 3   olopatadine  (PATANOL) 0.1 % ophthalmic solution, Place 1 drop into both eyes 2 (two) times daily as needed for allergies., Disp: 5 mL, Rfl: 2   pantoprazole  (PROTONIX ) 40 MG tablet, Take 40 mg by mouth daily., Disp: , Rfl:    XARELTO  15 MG TABS tablet, TAKE ONE TABLET BY MOUTH EVERY DAY WITH SUPPER, Disp: 90 tablet, Rfl: 3   furosemide  (LASIX ) 20 MG tablet, Take 0.5 tablets (10 mg total) by mouth daily as needed (shortness of breath, or swelling, or daily weight gain greater than 3 pounds). (Patient not taking: Reported on 03/22/2024), Disp: 90 tablet, Rfl: 3   Subjective:   PATIENT ID: Brittney [name] GENDER: female DOB: August 29, 1929, MRN: [id number]  Chief Complaint  Patient presents with   New Patient (Initial Visit)    SOB/DOE, history of AFIB and diastolic dysfunction, x-ray: 01/11/24  Breathing has been challenging lately -- mostly SOB and DOE, had a flare up about a couple months ago and was given supplemental oxygen which had helped at the time and is starting to feel the same way again. No history of asthma or COPD. Never smoker -- but extensive second hand smoke exposure for about 13 years from her parents.  History of fluid on her lung from when she last felt this way.    HPI  Patient is a pleasant 89 year old female presenting to clinic for the evaluation of shortness of breath.  Patient reports being in her usual state of health up until a couple of months ago. She is generally very active, and able to do all the activities of her daily living without limitation. She lives at [facility], and is very active in her community. She reports exertional dyspnea since May of 2025. There is no associated cough, wheeze, chest tightness, sputum production, hemoptysis, fevers, chills, or night sweats. She has not had any weight changes recently. She feels the cough might be a bit worse at night when laying flat. She has no rashes and no joint stiffness or swelling/erythema.  Patient reports some GI issues in the past, and had been told she has a colitis in the past. She does not carry a diagnosis of Crohn's or Ulcerative Colitis. She does recall being told she had a hiatal hernia in the past. She is otherwise in good health and excellent spirit. She has no mold in her house, does not play any wind instruments, and has not had any occupational exposures. She has not had any pet birds.  She report a history of HTN, CKD, HLD, TIA, HFpEF (on as needed low dose diuretic), Mitral Regurgitation, as well as Atrial Fibrillation on Rivaroxaban . On review of the medical record, she's had visits with cardiology for exertional dyspnea in the past, thou these symptoms previously spontaneously resolved.  She was born in [place], [place]. She was adopted at a young age and grew up in [place]. 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 She has 4 children who are all healthy. She is a non-smoker. She has one 58 year old dog (Yorkie).  Ancillary information including prior medications, full  medical/surgical/family/social histories, and PFTs (when available) are listed below and have been reviewed.   Review of Systems  Constitutional:  Negative for chills, fever, malaise/fatigue and weight loss.  Respiratory:  Positive for shortness of breath. Negative for cough, hemoptysis and sputum production.   Cardiovascular:  Negative for chest pain.     Objective:   Vitals:   03/22/24 1339  BP: 118/70  Pulse: 76  Temp: 98 F (36.7 C)  TempSrc: Oral  SpO2: 96%  Weight: 153 lb 3.2 oz (69.5 kg)  Height: 4' 11 (1.499 m)   96% on RA BMI Readings from Last 3 Encounters:  03/22/24 30.94 kg/m  03/06/24 30.42 kg/m  01/18/24 30.90 kg/m   Wt Readings from Last 3 Encounters:  03/22/24 153 lb 3.2 oz (69.5 kg)  03/06/24 150 lb 9.6 oz (68.3 kg)  01/18/24 153 lb (69.4 kg)    Physical Exam Constitutional:      Appearance: Normal appearance.  Cardiovascular:     Rate and Rhythm: Normal rate. Rhythm irregular.     Pulses: Normal pulses.     Heart sounds:  Normal heart sounds.  Pulmonary:     Effort: Pulmonary effort is normal.     Breath sounds: Normal breath sounds.  Neurological:     General: No focal deficit present.     Mental Status: She is alert and oriented to person, place, and time. Mental status is at baseline.       Ancillary Information    Past Medical History:  Diagnosis Date   Adopted person    Allergy    Anemia    Angina pectoris (HCC)    Aortic atherosclerosis (HCC)    Arthritis    Atrial fibrillation (HCC)    a. ) CHA2DS2-VASc = 7 (age x 2, sex, HTN, TIA x2, aortic plaque);  b.) rate/rhythm maintained on oral metoprolol  succinate; chronically anticoagulated using dose reduced apixaban .   Bilateral carotid artery disease (HCC)    CAD (coronary artery disease)    a.) LHC 02/09/2007: EF 60%, LVEDP 25 mmHg, normal cors; b.) cCTA 11/27/2011: Ca score 129.73 --> 1.17 RCA, 16.36 LM, 112.21 LAD, 0 LCx; c.) MPI 03/13/2014 - normal.   Cataract    CKD  (chronic kidney disease), stage IV (HCC)    Diastolic dysfunction 02/09/2008   a.) LHC 02/09/2008: EF 60%, LVEDP 25 mmHg; b.) TTE 08/07/2010: EF 65%, mild LVH, mild MR, G2DD; b.) TTE 08/20/2018: EF 45-50%, inferior HK, mild LAE; c.) TTE 08/11/2021: EF 45-50%. 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   Left carotid bruit    Long term current use of anticoagulant    a.) apixaban    Lumbar stenosis    Lymphocytic colitis    Osteoporosis    TIA (transient ischemic attack) 07/2018   TIA (transient ischemic attack) 08/2021   Vitamin B12 deficiency    Wears hearing BILATERAL aids    Wears partial (bottom) dentures      Family History  Adopted: Yes  Problem Relation Age of Onset   Heart disease Sister    Stroke Brother      Past Surgical History:  Procedure Laterality Date   BREAST EXCISIONAL BIOPSY Right 36 years ago   neg   BUNIONECTOMY Bilateral    COLONOSCOPY N/A 07/02/2008   KNEE ARTHROPLASTY Right 03/11/2022   Procedure: COMPUTER ASSISTED TOTAL KNEE ARTHROPLASTY;  Surgeon: Mardee Lynwood SQUIBB, MD;  Location: [facility] ORS;  Service: Orthopedics;  Laterality: Right;   KNEE ARTHROSCOPY Right 2006   LEFT HEART CATH AND CORONARY ANGIOGRAPHY Left 02/09/2007   Procedure: LEFT HEART CATH AND CORONARY ANGIOGRAPHY; Location: [facility]   TONSILLECTOMY AND ADENOIDECTOMY Bilateral    TOTAL ABDOMINAL HYSTERECTOMY W/ BILATERAL SALPINGOOPHORECTOMY N/A    TOTAL KNEE ARTHROPLASTY Left 04/26/2014   TUBAL LIGATION      Social History   Socioeconomic History   Marital status: Widowed    Spouse name: Not on file   Number of children: Not on file   Years of education: Not on file   Highest education level: Not on file  Occupational History   Not on file  Tobacco Use   Smoking status: Never    Passive exposure: Past (12yrs  of exporsure from parents growing up)   Smokeless tobacco: Never  Vaping Use   Vaping status: Never Used  Substance and Sexual Activity   Alcohol use: Not Currently   Drug use:  Never   Sexual activity: Not Currently  Other Topics Concern   Not on file  Social History Narrative   Not on file   Social Drivers of Health   Financial Resource Strain: Low Risk  (03/14/2024)   Received from [facility]   Overall Financial Resource Strain (CARDIA)    Difficulty of Paying Living Expenses: Not hard at all  Food Insecurity: No Food Insecurity (03/14/2024)   Received from [facility]   Hunger Vital Sign    Within the past 12 months, you worried that your food would run out before you got the money to buy more.: Never true    Within the past 12 months, the food you bought just didn't last and you didn't have money to get more.: Never true  Transportation Needs: No Transportation Needs (03/14/2024)   Received from [facility] - Transportation    In the past 12 months, has lack of transportation kept you from medical appointments or from getting medications?: No    Lack of Transportation (Non-Medical): No  Physical Activity: Not on file  Stress: Not on file  Social Connections: Not on file  Intimate Partner Violence: Not on file     Allergies  Allergen Reactions   Lisinopril Swelling   Mixed Grasses Anaphylaxis   Bee Venom Swelling   Azithromycin Diarrhea   Baclofen Diarrhea   Iodine     CKD   Morphine  And Codeine Nausea And Vomiting   [place] Clam (M. Mercenaria) Skin Test Diarrhea    Mussels.   Sulfa Antibiotics Rash     CBC    Component Value Date/Time   WBC 6.0 03/06/2024 1151   WBC 6.9 01/03/2024 0729   RBC 3.51 (L) 03/06/2024 1151   RBC 3.28 (L) 01/03/2024 0729   HGB 10.0 (L) 03/06/2024 1151   HCT 32.4 (L) 03/06/2024 1151   PLT 485 (H) 03/06/2024 1151   MCV 92 03/06/2024 1151   MCH 28.5 03/06/2024  1151   MCH 29.3 01/03/2024 0729   MCHC 30.9 (L) 03/06/2024 1151   MCHC 31.7 (L) 01/03/2024 0729   RDW 14.4 03/06/2024 1151   LYMPHSABS 1,487 04/12/2023 0735   MONOABS 0.6 08/19/2018 1057   EOSABS 0 (L) 01/03/2024 0729   BASOSABS 110 01/03/2024 0729    Pulmonary Functions Testing Results:     No data to display          Outpatient Medications Prior to Visit  Medication Sig Dispense Refill   acetaminophen  (TYLENOL ) 500 MG tablet Take 1,000 mg by mouth 2 (two) times daily as needed.     allopurinol  (ZYLOPRIM ) 100 MG tablet Take 0.5 tablets (50 mg total) by mouth daily. 15 tablet 6   Cholecalciferol  (VITAMIN D ) 50 MCG (2000 UT) tablet Take 2,000 Units by mouth daily.     cyanocobalamin  (VITAMIN B12) 1000 MCG tablet Take 1,000 mcg by mouth 3 (three) times a week.     EPINEPHrine  0.3 mg/0.3 mL IJ SOAJ injection Inject 0.3 mg into the muscle as needed for anaphylaxis. 1 each 3   fluticasone  (FLONASE ) 50 MCG/ACT nasal spray Place 2 sprays into both nostrils as needed. 16 g 1   loratadine  (CLARITIN ) 10 MG tablet Take 10 mg by mouth daily.     metoprolol  succinate (TOPROL -XL) 25 MG 24 hr tablet Take 0.5 tablets (12.5 mg total) by mouth daily. 45 tablet 3   olopatadine  (PATANOL) 0.1 % ophthalmic solution Place 1 drop into  both eyes 2 (two) times daily as needed for allergies. 5 mL 2   pantoprazole  (PROTONIX ) 40 MG tablet Take 40 mg by mouth daily.     XARELTO  15 MG TABS tablet TAKE ONE TABLET BY MOUTH EVERY DAY WITH SUPPER 90 tablet 3   furosemide  (LASIX ) 20 MG tablet Take 0.5 tablets (10 mg total) by mouth daily as needed (shortness of breath, or swelling, or daily weight gain greater than 3 pounds). (Patient not taking: Reported on 03/22/2024) 90 tablet 3   No facility-administered medications prior to visit.

## 2024-03-24 ENCOUNTER — Ambulatory Visit: Payer: Self-pay | Admitting: Student in an Organized Health Care Education/Training Program

## 2024-03-24 NOTE — Telephone Encounter (Signed)
 FYI Only or Action Required?: FYI only for provider.  Patient is followed in Pulmonology for SOB, last seen on 03/22/2024 by Brittney Hose, MD.  Called Nurse Triage reporting Shortness of Breath and Headache.  Symptoms began several months ago.  Interventions attempted: Increased fluids/rest.  Symptoms are: unchanged.  Triage Disposition: See PCP Within 2 Weeks  Patient/caregiver understands and will follow disposition?: Yes    Copied from CRM [id number]. Topic: Clinical - Red Word Triage >> Mar 24, 2024 11:58 AM Brittney [name] wrote: Red Word that prompted transfer to Nurse Triage: Patient states she thinks she needs to go to the hospital - headaches, not good breathing - per pt. Reason for Disposition  [1] MILD longstanding difficulty breathing (e.g., minimal/no SOB at rest, SOB with walking, pulse < 100) AND [2] SAME as normal  Answer Assessment - Initial Assessment Questions E2C2 Pulmonary Triage - Initial Assessment Questions Chief Complaint (e.g., cough, sob, wheezing, fever, chills, sweat or additional symptoms) *Go to specific symptom protocol after initial questions. SOB, hoarse throat Had [name] pretty bad night - SOB with exertion Recently saw dr Brittney   How long have symptoms been present? Several months  Have you tested for COVID or Flu? Note: If not, ask patient if [name] can be taken. If so, instruct patient to call back for positive results. No  MEDICINES:   Have you used any OTC meds to help with symptoms? No If yes, ask [name], [name]  If inhaler, ask How many puffs and how often? Note: Review instructions on medication in the chart. [name]  OXYGEN: Do you wear supplemental oxygen? No If yes, [name]  Do you monitor your oxygen levels? No If yes, What is your reading (oxygen level) today? [name]  What  is your usual oxygen saturation reading?  (Note: Pulmonary O2 sats should be 90% or greater) unknown       1. RESPIRATORY STATUS: Describe your breathing? (e.g., wheezing, shortness of breath, unable to speak, severe coughing)      SOB, hoarseness, endorses light wheezing in AM - reports self resolves 2. ONSET: When did this breathing problem begin?      See above 3. PATTERN Does the difficult breathing come and go, or has it been constant since it started?      Comes and goes 4. SEVERITY: How bad is your breathing? (e.g., mild, moderate, severe)      Mild - with exertion Triager does not appreciate audible SOB/wheezing during call. Pt is speaking in full sentences.  5. RECURRENT SYMPTOM: Have you had difficulty breathing before? If Yes, ask: When was the last time? and What happened that time?      First time 6. CARDIAC HISTORY: Do you have any history of heart disease? (e.g., heart attack, angina, bypass surgery, angioplasty)      CHF per chart - does not take water pills, denies weight gain 7. LUNG HISTORY: Do you have any history of lung disease?  (e.g., pulmonary embolus, asthma, emphysema)     [name] 8. CAUSE: What do you think is causing the breathing problem?      unknown 9. OTHER SYMPTOMS: Do you have any other symptoms? (e.g., chest pain, cough, dizziness, fever, runny nose)     Endorses occasional dizziness - self resolves with rest Denies other sx 10. O2 SATURATION MONITOR:  Do you use an oxygen saturation  monitor (pulse oximeter) at home? If Yes, ask: What is your reading (oxygen level) today? What is your usual oxygen saturation reading? (e.g., 95%)       [name] 11. PREGNANCY: Is there any chance you are pregnant? When was your last menstrual period?       [name] 12. TRAVEL: Have you traveled out of the country in the last month? (e.g., travel history, exposures)       [name]    [name], with no acute changes. Triager strongly reinforced pt to complete Dr. Clydene orders -- pt endorses that she already has appts including an LBPU F/U and plans to keep all appts.  Triager strongly advised to call back for any worsening/acute sx. Patient verbalized understanding and to call back/911 with worsening symptoms.  [name]

## 2024-03-24 NOTE — Telephone Encounter (Signed)
 Noted. Nothing further needed.

## 2024-03-27 ENCOUNTER — Ambulatory Visit
Admission: RE | Admit: 2024-03-27 | Discharge: 2024-03-27 | Disposition: A | Discharge status: Home / Self Care | Source: Ambulatory Visit | Attending: Student in an Organized Health Care Education/Training Program

## 2024-03-27 DIAGNOSIS — R0602 Shortness of breath: Secondary | ICD-10-CM

## 2024-03-29 ENCOUNTER — Ambulatory Visit
Admission: RE | Admit: 2024-03-29 | Discharge: 2024-03-29 | Disposition: A | Discharge status: Home / Self Care | Source: Ambulatory Visit | Attending: Student in an Organized Health Care Education/Training Program | Admitting: Student in an Organized Health Care Education/Training Program

## 2024-03-29 DIAGNOSIS — R0602 Shortness of breath: Secondary | ICD-10-CM | POA: Diagnosis present

## 2024-03-31 ENCOUNTER — Encounter

## 2024-04-04 ENCOUNTER — Encounter

## 2024-04-04 ENCOUNTER — Ambulatory Visit: Admitting: Student in an Organized Health Care Education/Training Program

## 2024-04-04 DIAGNOSIS — R0602 Shortness of breath: Secondary | ICD-10-CM

## 2024-04-04 LAB — PULMONARY FUNCTION TEST
DL/VA: 2.86 ml/min/mmHg/L
DLCO unc: 8.72 ml/min/mmHg
FEF 25-75 Post: 1.82 L/s
FEF 25-75 Pre: 1.2 L/s
FEF2575-%Change-Post: 52 %
FEF2575-%Pred-Post: 371 %
FEF2575-%Pred-Pre: 243 %
FEV1-%Change-Post: 9 %
FEV1-%Pred-Post: 127 %
FEV1-%Pred-Pre: 116 %
FEV1-Post: 1.33 L
FEV1-Pre: 1.21 L
FEV1FVC-%Change-Post: 6 %
FEV1FVC-%Pred-Pre: 113 %
FEV6-%Change-Post: 2 %
FEV6-%Pred-Post: 115 %
FEV6-%Pred-Pre: 113 %
FEV6-Post: 1.54 L
FEV6-Pre: 1.5 L
FEV6FVC-%Pred-Post: 109 %
FEV6FVC-%Pred-Pre: 109 %
FVC-%Change-Post: 2 %
FVC-%Pred-Post: 105 %
FVC-%Pred-Pre: 102 %
FVC-Post: 1.54 L
FVC-Pre: 1.5 L
Post FEV1/FVC ratio: 86 %
Post FEV6/FVC ratio: 100 %
Pre FEV1/FVC ratio: 81 %
Pre FEV6/FVC Ratio: 100 %
RV % pred: 67 %
RV: 1.6 L
TLC % pred: 80 %
TLC: 3.45 L

## 2024-04-04 NOTE — Progress Notes (Signed)
 Full PFT completed today ? ?

## 2024-04-04 NOTE — Patient Instructions (Signed)
 Full PFT completed today ? ?

## 2024-04-06 ENCOUNTER — Encounter

## 2024-04-10 ENCOUNTER — Ambulatory Visit: Payer: Self-pay | Admitting: Nurse Practitioner

## 2024-04-10 LAB — CBC WITH DIFFERENTIAL/PLATELET
Absolute Lymphocytes: 1235 {cells}/uL (ref 850–3900)
Absolute Monocytes: 755 {cells}/uL (ref 200–950)
Basophils Absolute: 109 {cells}/uL (ref 0–200)
Basophils Relative: 1.7 %
Eosinophils Absolute: 13 {cells}/uL — ABNORMAL LOW (ref 15–500)
Eosinophils Relative: 0.2 %
HCT: 31 % — ABNORMAL LOW (ref 35.0–45.0)
Hemoglobin: 9.5 g/dL — ABNORMAL LOW (ref 11.7–15.5)
MCH: 28 pg (ref 27.0–33.0)
MCHC: 30.6 g/dL — ABNORMAL LOW (ref 32.0–36.0)
MCV: 91.4 fL (ref 80.0–100.0)
MPV: 10.5 fL (ref 7.5–12.5)
Monocytes Relative: 11.8 %
Neutro Abs: 4288 {cells}/uL (ref 1500–7800)
Neutrophils Relative %: 67 %
Platelets: 533 Thousand/uL — ABNORMAL HIGH (ref 140–400)
RBC: 3.39 Million/uL — ABNORMAL LOW (ref 3.80–5.10)
RDW: 14.4 % (ref 11.0–15.0)
Total Lymphocyte: 19.3 %
WBC: 6.4 Thousand/uL (ref 3.8–10.8)

## 2024-04-11 DIAGNOSIS — N1832 Chronic kidney disease, stage 3b: Secondary | ICD-10-CM

## 2024-04-11 NOTE — Telephone Encounter (Signed)
 MyChart message sent back to patient to contact Cardiologist office with update on referral. We did not place this referral and they would need to contact their referral coordinator.

## 2024-04-11 NOTE — Telephone Encounter (Signed)
I have responded to patient via MyChart. 

## 2024-04-11 NOTE — Telephone Encounter (Unsigned)
 Copied from CRM #[id number]. Topic: Referral - Status >> Apr 11, 2024  9:24 AM Diannia H wrote: Reason for CRM: Patient was returning a call from provider about results. Patient also stated that she has not heard from a nephrologist and she is needing some updated information. I looked in the referral and it didn't have any information to give her. Could you please assist? Patient can be called back at [phone] or contacted via Mychart. She also said she needed a provider in [place] for nephrology because she doesn't drive a whole lot.

## 2024-04-12 NOTE — Addendum Note (Signed)
 Addended by: BRIEN SALM on: 04/12/2024 11:24 AM   Modules accepted: Orders

## 2024-04-13 ENCOUNTER — Other Ambulatory Visit: Payer: Self-pay | Admitting: Nurse Practitioner

## 2024-04-19 NOTE — Telephone Encounter (Signed)
 [facility] called in about referral. They do not use Epic, they asked that referral be faxed over.    Fax: [phone]

## 2024-04-20 ENCOUNTER — Encounter: Payer: Self-pay | Admitting: Nurse Practitioner

## 2024-04-20 ENCOUNTER — Ambulatory Visit: Payer: Medicare Other | Admitting: Nurse Practitioner

## 2024-04-20 VITALS — BP 134/72 | HR 81 | Temp 97.6°F | Ht 59.0 in | Wt 150.8 lb

## 2024-04-20 DIAGNOSIS — I4821 Permanent atrial fibrillation: Secondary | ICD-10-CM | POA: Diagnosis not present

## 2024-04-20 DIAGNOSIS — N1831 Chronic kidney disease, stage 3a: Secondary | ICD-10-CM

## 2024-04-20 DIAGNOSIS — M109 Gout, unspecified: Secondary | ICD-10-CM | POA: Diagnosis not present

## 2024-04-20 DIAGNOSIS — E739 Lactose intolerance, unspecified: Secondary | ICD-10-CM

## 2024-04-20 DIAGNOSIS — D649 Anemia, unspecified: Secondary | ICD-10-CM

## 2024-04-20 DIAGNOSIS — N3281 Overactive bladder: Secondary | ICD-10-CM | POA: Diagnosis not present

## 2024-04-20 DIAGNOSIS — I5032 Chronic diastolic (congestive) heart failure: Secondary | ICD-10-CM

## 2024-04-20 DIAGNOSIS — K219 Gastro-esophageal reflux disease without esophagitis: Secondary | ICD-10-CM

## 2024-04-20 DIAGNOSIS — R0602 Shortness of breath: Secondary | ICD-10-CM

## 2024-04-20 MED ORDER — GEMTESA 75 MG PO TABS: 75.0000 mg | ORAL_TABLET | Freq: Every day | ORAL | 1 refills | Status: DC

## 2024-04-20 NOTE — Progress Notes (Signed)
 Careteam: Patient Care Team: Brittney Harlene POUR, NP as PCP - General (Geriatric Medicine) [name], MD as PCP - Cardiology (Cardiology) [name], OD (Optometry) PLACE OF SERVICE:  [facility]   Advanced Directive information Does Patient Have a Medical Advance Directive?: Yes, Type of Advance Directive: Healthcare Power of [place];Living will;Out of facility DNR (pink MOST or yellow form), Does patient want to make changes to medical advance directive?: No - Patient declined  Allergies  Allergen Reactions   Lisinopril Swelling   Mixed Grasses Anaphylaxis   Bee Venom Swelling   Azithromycin Diarrhea   Baclofen Diarrhea   Iodine     CKD   Morphine  And Codeine Nausea And Vomiting   [place] Clam (M. Mercenaria) Skin Test Diarrhea    Mussels.   Sulfa Antibiotics Rash    Chief Complaint  Patient presents with   Medical Management of Chronic Issues    Medical Management of Chronic Issues. 6 Month follow up. To discuss need for Flu and Zoster.      HPI: Patient is a 89 y.o. female seen in today for routine follow up  Discussed the use of AI scribe software for clinical note transcription with the patient, who gave verbal consent to proceed.  History of Present Illness Brittney [name] is a 89 year old female with heart failure and atrial fibrillation who presents for a routine follow-up.  She is experiencing ongoing issues with kidney function, which worsened on her last blood test. She has been trying to schedule an appointment with a nephrologist at [facility] but has faced difficulties due to a delay in the referral process from her cardiologist. She is awaiting confirmation that the referral has been received. In epic appears referral has been sent, likely waiting review.   She has a fib, She is not experiencing palpitations. her medications include Xarelto  15 mg daily, with no issues of abnormal bruising or  bleeding.  She experiences shortness of breath, particularly when walking, which has not worsened but remains present. She has seen a pulmonologist who ordered several tests, but no conclusions have been reached yet. She is scheduled to follow up at the end of September. She also has a large hiatal hernia. She was previously on furosemide  but discontinued it due to dizziness. Currently, she is not on any diuretics.   She has a history of hiatal hernia but no significant indigestion or acid reflux, as she avoids eating late and takes pantoprazole  daily. Hernia also could be contributing to shortness of breath  She has been experiencing diarrhea, which she attributes to lactose intolerance. She has improved her symptoms by avoiding lactose-containing foods, such as a nightly popsicle.   She experiences nocturia, waking up four times a night to urinate, which affects her sleep. She is trying to limit fluid intake in the evening. No headaches, dizziness, or recent weight gain. She is trying to lose weight.  She has a history of gout, with her big toes occasionally turning purple, but she does not experience pain, possibly due to allopurinol , which she has been taking for about two months.    Review of Systems:  Review of Systems  Constitutional:  Negative for chills, fever and weight loss.  HENT:  Negative for tinnitus.   Respiratory:  Positive for shortness of breath (with exertion). Negative for cough and sputum production.   Cardiovascular:  Negative for chest pain, palpitations and leg swelling.  Gastrointestinal:  Negative for abdominal pain,  constipation, diarrhea and heartburn.  Genitourinary:  Negative for dysuria, frequency and urgency.  Musculoskeletal:  Negative for back pain, falls, joint pain and myalgias.  Skin: Negative.   Neurological:  Negative for dizziness and headaches.  Psychiatric/Behavioral:  Negative for depression and memory loss. The patient does not have insomnia.      Past Medical History:  Diagnosis Date   Adopted person    Allergy    Anemia    Angina pectoris (HCC)    Aortic atherosclerosis (HCC)    Arthritis    Atrial fibrillation (HCC)    a. ) CHA2DS2-VASc = 7 (age x 2, sex, HTN, TIA x2, aortic plaque);  b.) rate/rhythm maintained on oral metoprolol  succinate; chronically anticoagulated using dose reduced apixaban .   Bilateral carotid artery disease (HCC)    CAD (coronary artery disease)    a.) LHC 02/09/2007: EF 60%, LVEDP 25 mmHg, normal cors; b.) cCTA 11/27/2011: Ca score 129.73 --> 1.17 RCA, 16.36 LM, 112.21 LAD, 0 LCx; c.) MPI 03/13/2014 - normal.   Cataract    CKD (chronic kidney disease), stage IV (HCC)    Diastolic dysfunction 02/09/2008   a.) LHC 02/09/2008: EF 60%, LVEDP 25 mmHg; b.) TTE 08/07/2010: EF 65%, mild LVH, mild MR, G2DD; b.) TTE 08/20/2018: EF 45-50%, inferior HK, mild LAE; c.) TTE 08/11/2021: EF 45-50%. 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   Left carotid bruit    Long term current use of anticoagulant    a.) apixaban    Lumbar stenosis    Lymphocytic colitis    Osteoporosis    TIA (transient ischemic attack) 07/2018   TIA (transient ischemic attack) 08/2021   Vitamin B12 deficiency    Wears hearing BILATERAL aids    Wears partial (bottom) dentures    Past Surgical History:  Procedure Laterality Date   BREAST EXCISIONAL BIOPSY Right 36 years ago   neg   BUNIONECTOMY Bilateral    COLONOSCOPY N/A 07/02/2008   KNEE ARTHROPLASTY Right 03/11/2022   Procedure: COMPUTER ASSISTED TOTAL KNEE ARTHROPLASTY;  Surgeon: Mardee Lynwood SQUIBB, MD;  Location: [facility] ORS;  Service: Orthopedics;  Laterality: Right;   KNEE ARTHROSCOPY Right 2006   LEFT HEART CATH AND CORONARY ANGIOGRAPHY Left 02/09/2007   Procedure: LEFT HEART CATH AND CORONARY ANGIOGRAPHY;  Location: [facility]   TONSILLECTOMY AND ADENOIDECTOMY Bilateral    TOTAL ABDOMINAL HYSTERECTOMY W/ BILATERAL SALPINGOOPHORECTOMY N/A    TOTAL KNEE ARTHROPLASTY Left 04/26/2014   TUBAL LIGATION     Social History:   reports that she has never smoked. She has been exposed to tobacco smoke. She has never used smokeless tobacco. She reports that she does not currently use alcohol. She reports that she does not use drugs.  Family History  Adopted: Yes  Problem Relation Age of Onset   Heart disease Sister    Stroke Brother     Medications: Patient's Medications  New Prescriptions   No medications on file  Previous Medications   ACETAMINOPHEN  (TYLENOL ) 500 MG TABLET    Take 1,000 mg by mouth 2 (two) times daily as needed.   ALLOPURINOL  (ZYLOPRIM ) 100 MG TABLET    Take 0.5 tablets (50 mg total) by mouth daily.   CHOLECALCIFEROL  (VITAMIN D ) 50 MCG (2000 UT) TABLET    Take 2,000 Units by mouth daily.  CYANOCOBALAMIN  (B-12) 1000 MCG TABS    TAKE ONE TABLET BY MOUTH EVERY DAY   EPINEPHRINE  0.3 MG/0.3 ML IJ SOAJ INJECTION    Inject 0.3 mg into the muscle as needed for anaphylaxis.   FLUTICASONE  (FLONASE ) 50 MCG/ACT NASAL SPRAY    Place 2 sprays into both nostrils as needed.   FUROSEMIDE  (LASIX ) 20 MG TABLET    Take 0.5 tablets (10 mg total) by mouth daily as needed (shortness of breath, or swelling, or daily weight gain greater than 3 pounds).   LORATADINE  (CLARITIN ) 10 MG TABLET    Take 10 mg by mouth daily.   METOPROLOL  SUCCINATE (TOPROL -XL) 25 MG 24 HR TABLET    Take 0.5 tablets (12.5 mg total) by mouth daily.   OLOPATADINE  (PATANOL) 0.1 % OPHTHALMIC SOLUTION    Place 1 drop into both eyes 2 (two) times daily as needed for allergies.   PANTOPRAZOLE  (PROTONIX ) 40 MG TABLET    Take 40 mg by mouth daily.   XARELTO  15 MG TABS TABLET    TAKE ONE TABLET BY MOUTH EVERY DAY WITH SUPPER  Modified Medications   No medications on file  Discontinued Medications   No medications on file     Physical Exam:  Vitals:   04/20/24 1059  BP: 134/72  Pulse: 81  Temp: 97.6 F (36.4 C)  SpO2: 96%  Weight: 150 lb 12.8 oz (68.4 kg)  Height: 4' 11 (1.499 m)   Body mass index is 30.46 kg/m. Wt Readings from Last 3 Encounters:  04/20/24 150 lb 12.8 oz (68.4 kg)  04/04/24 151 lb 3.2 oz (68.6 kg)  03/22/24 153 lb 3.2 oz (69.5 kg)    Physical Exam Constitutional:      General: She is not in acute distress.    Appearance: She is well-developed. She is not diaphoretic.  HENT:     Head: Normocephalic and atraumatic.     Mouth/Throat:     Pharynx: No oropharyngeal exudate.  Eyes:     Conjunctiva/sclera: Conjunctivae normal.     Pupils: Pupils are equal, round, and reactive to light.  Cardiovascular:     Rate and Rhythm: Normal rate and regular rhythm.     Heart sounds: Normal heart sounds.  Pulmonary:     Effort: Pulmonary effort is normal.     Breath sounds: Normal breath sounds.  Abdominal:     General: Bowel sounds are normal.     Palpations: Abdomen is soft.  Musculoskeletal:     Cervical back: Normal range of motion and neck supple.     Right lower leg: No edema.     Left lower leg: No edema.  Skin:    General: Skin is warm and dry.  Neurological:     Mental Status: She is alert.  Psychiatric:        Mood and Affect: Mood normal.     Labs reviewed: Basic Metabolic Panel: Recent Labs    09/06/23 1406 01/03/24 0729 03/06/24 1151  NA 143 142 141  K 4.9 4.7 5.2  CL 105 108 106  CO2 24 28 19*  GLUCOSE 99 89 81  BUN 34 39* 32  CREATININE 1.54* 1.47* 1.60*  CALCIUM  9.8 9.6 10.0   Liver Function Tests: Recent Labs    01/03/24 0729  AST 18  ALT 7  BILITOT 0.9  PROT 6.2   No results for input(s): LIPASE, AMYLASE in the last 8760 hours. No results for input(s): AMMONIA in the last 8760 hours. CBC: Recent Labs  01/03/24 0729 03/06/24 1151 04/10/24 0000  WBC 6.9 6.0 6.4  NEUTROABS 4,057  --  4,288  HGB 9.6* 10.0* 9.5*  HCT 30.3*  32.4* 31.0*  MCV 92.4 92 91.4  PLT 437* 485* 533*   Lipid Panel: No results for input(s): CHOL, HDL, LDLCALC, TRIG, CHOLHDL, LDLDIRECT in the last 8760 hours. TSH: No results for input(s): TSH in the last 8760 hours. A1C: Lab Results  Component Value Date   HGBA1C 5.4 08/20/2018     Assessment/Plan Assessment and Plan Assessment & Plan Chronic kidney disease, stage 3a Chronic kidney disease stage 3a with recent increase in kidney function markers. Diarrhea potentially exacerbating kidney function decline. - Ensure referral to [facility] is completed. - Continue lactose-free diet to manage diarrhea.  Permanent atrial fibrillation Permanent atrial fibrillation is well-controlled with no recent episodes of palpitations or tachycardia. Managed with Xarelto  without abnormal bruising or bleeding. - Continue Xarelto  15 mg daily.  Heart failure Euvolemic at this time   Shortness of breath Persistent shortness of breath with exertion, possibly related to large hiatal hernia and fluid accumulation in lungs. - Follow up with pulmonologist at the end of September for ongoing work up   Overactive bladder Overactive bladder causing nocturia up to four times per night. Discussed potential treatment options and side effects, including urinary retention and blood pressure changes. Gemtesa  prescribed with consideration of cost and side effects. - Prescribe Gemtesa  with one refill. - Consider alternative medication if Gemtesa  is not affordable.  Gout Gout with occasional discoloration of big toes but no pain, possibly due to allopurinol  use. Uric acid levels not recently rechecked. - Continue allopurinol . - Recheck uric acid levels.  Anemia Slight decrease in hemoglobin, possibly related to chronic kidney disease. Further evaluation by nephrology is pending. - Monitor hemoglobin levels. - Ensure nephrology referral is completed.  GERD Large hiatal  hernia potentially contributing to shortness of breath. No significant acid reflux symptoms reported. - Avoid eating late at night. Continues on protonix .   Lactose intolerance Recurrent lactose intolerance with improvement in symptoms after dietary modification. Diarrhea has improved with avoidance of lactose-containing foods. - Continue lactose-free diet.    Next appt: 4 months  Cynthia Cogle K. Brittney BODILY  [facility] [phone]

## 2024-05-07 ENCOUNTER — Emergency Department

## 2024-05-07 ENCOUNTER — Other Ambulatory Visit: Payer: Self-pay

## 2024-05-07 ENCOUNTER — Inpatient Hospital Stay
Admission: EM | Admit: 2024-05-07 | Discharge: 2024-05-08 | DRG: 291 | Disposition: A | Discharge status: Home / Self Care | Attending: Student in an Organized Health Care Education/Training Program | Admitting: Student in an Organized Health Care Education/Training Program

## 2024-05-07 DIAGNOSIS — I444 Left anterior fascicular block: Secondary | ICD-10-CM | POA: Diagnosis present

## 2024-05-07 DIAGNOSIS — I2510 Atherosclerotic heart disease of native coronary artery without angina pectoris: Secondary | ICD-10-CM | POA: Diagnosis present

## 2024-05-07 DIAGNOSIS — Z882 Allergy status to sulfonamides status: Secondary | ICD-10-CM | POA: Diagnosis not present

## 2024-05-07 DIAGNOSIS — E739 Lactose intolerance, unspecified: Secondary | ICD-10-CM | POA: Diagnosis present

## 2024-05-07 DIAGNOSIS — Z66 Do not resuscitate: Secondary | ICD-10-CM | POA: Diagnosis present

## 2024-05-07 DIAGNOSIS — Z881 Allergy status to other antibiotic agents status: Secondary | ICD-10-CM | POA: Diagnosis not present

## 2024-05-07 DIAGNOSIS — Z888 Allergy status to other drugs, medicaments and biological substances status: Secondary | ICD-10-CM

## 2024-05-07 DIAGNOSIS — Z9079 Acquired absence of other genital organ(s): Secondary | ICD-10-CM

## 2024-05-07 DIAGNOSIS — I130 Hypertensive heart and chronic kidney disease with heart failure and stage 1 through stage 4 chronic kidney disease, or unspecified chronic kidney disease: Secondary | ICD-10-CM | POA: Diagnosis present

## 2024-05-07 DIAGNOSIS — I4891 Unspecified atrial fibrillation: Secondary | ICD-10-CM | POA: Diagnosis present

## 2024-05-07 DIAGNOSIS — N1832 Chronic kidney disease, stage 3b: Secondary | ICD-10-CM | POA: Diagnosis present

## 2024-05-07 DIAGNOSIS — Z8249 Family history of ischemic heart disease and other diseases of the circulatory system: Secondary | ICD-10-CM

## 2024-05-07 DIAGNOSIS — E785 Hyperlipidemia, unspecified: Secondary | ICD-10-CM | POA: Diagnosis present

## 2024-05-07 DIAGNOSIS — I5023 Acute on chronic systolic (congestive) heart failure: Secondary | ICD-10-CM

## 2024-05-07 DIAGNOSIS — Z8673 Personal history of transient ischemic attack (TIA), and cerebral infarction without residual deficits: Secondary | ICD-10-CM

## 2024-05-07 DIAGNOSIS — Z90722 Acquired absence of ovaries, bilateral: Secondary | ICD-10-CM

## 2024-05-07 DIAGNOSIS — I5033 Acute on chronic diastolic (congestive) heart failure: Secondary | ICD-10-CM | POA: Diagnosis present

## 2024-05-07 DIAGNOSIS — K219 Gastro-esophageal reflux disease without esophagitis: Secondary | ICD-10-CM | POA: Diagnosis present

## 2024-05-07 DIAGNOSIS — Z7901 Long term (current) use of anticoagulants: Secondary | ICD-10-CM | POA: Diagnosis not present

## 2024-05-07 DIAGNOSIS — N183 Chronic kidney disease, stage 3 unspecified: Secondary | ICD-10-CM | POA: Diagnosis present

## 2024-05-07 DIAGNOSIS — M810 Age-related osteoporosis without current pathological fracture: Secondary | ICD-10-CM | POA: Diagnosis present

## 2024-05-07 DIAGNOSIS — D631 Anemia in chronic kidney disease: Secondary | ICD-10-CM | POA: Diagnosis present

## 2024-05-07 DIAGNOSIS — Z91030 Bee allergy status: Secondary | ICD-10-CM

## 2024-05-07 DIAGNOSIS — Z823 Family history of stroke: Secondary | ICD-10-CM | POA: Diagnosis not present

## 2024-05-07 DIAGNOSIS — Z885 Allergy status to narcotic agent status: Secondary | ICD-10-CM

## 2024-05-07 DIAGNOSIS — I700 Atherosclerosis of aorta: Secondary | ICD-10-CM | POA: Diagnosis present

## 2024-05-07 DIAGNOSIS — I509 Heart failure, unspecified: Secondary | ICD-10-CM | POA: Diagnosis not present

## 2024-05-07 DIAGNOSIS — J189 Pneumonia, unspecified organism: Principal | ICD-10-CM

## 2024-05-07 DIAGNOSIS — Z79899 Other long term (current) drug therapy: Secondary | ICD-10-CM

## 2024-05-07 DIAGNOSIS — Z90710 Acquired absence of both cervix and uterus: Secondary | ICD-10-CM

## 2024-05-07 DIAGNOSIS — Z96653 Presence of artificial knee joint, bilateral: Secondary | ICD-10-CM | POA: Diagnosis present

## 2024-05-07 DIAGNOSIS — I10 Essential (primary) hypertension: Secondary | ICD-10-CM | POA: Diagnosis present

## 2024-05-07 DIAGNOSIS — J90 Pleural effusion, not elsewhere classified: Secondary | ICD-10-CM

## 2024-05-07 LAB — LIPASE, BLOOD: Lipase: 31 U/L (ref 11–51)

## 2024-05-07 LAB — BASIC METABOLIC PANEL WITH GFR
Anion gap: 10 (ref 5–15)
BUN: 31 mg/dL — ABNORMAL HIGH (ref 8–23)
CO2: 22 mmol/L (ref 22–32)
Calcium: 9.5 mg/dL (ref 8.9–10.3)
Chloride: 108 mmol/L (ref 98–111)
Creatinine, Ser: 1.23 mg/dL — ABNORMAL HIGH (ref 0.44–1.00)
GFR, Estimated: 41 mL/min — ABNORMAL LOW (ref >60)
Glucose, Bld: 110 mg/dL — ABNORMAL HIGH (ref 70–99)
Potassium: 4.2 mmol/L (ref 3.5–5.1)
Sodium: 140 mmol/L (ref 135–145)

## 2024-05-07 LAB — CBC
HCT: 31.1 % — ABNORMAL LOW (ref 36.0–46.0)
Hemoglobin: 9.5 g/dL — ABNORMAL LOW (ref 12.0–15.0)
MCH: 28.5 pg (ref 26.0–34.0)
MCHC: 30.5 g/dL (ref 30.0–36.0)
MCV: 93.4 fL (ref 80.0–100.0)
Platelets: 473 10*3/uL — ABNORMAL HIGH (ref 150–400)
RBC: 3.33 MIL/uL — ABNORMAL LOW (ref 3.87–5.11)
RDW: 15.8 % — ABNORMAL HIGH (ref 11.5–15.5)
WBC: 7.5 10*3/uL (ref 4.0–10.5)
nRBC: 0 % (ref 0.0–0.2)

## 2024-05-07 LAB — HEPATIC FUNCTION PANEL
ALT: 7 U/L (ref 0–44)
AST: 21 U/L (ref 15–41)
Albumin: 3.8 g/dL (ref 3.5–5.0)
Alkaline Phosphatase: 55 U/L (ref 38–126)
Bilirubin, Direct: 0.2 mg/dL (ref 0.0–0.2)
Indirect Bilirubin: 1.2 mg/dL — ABNORMAL HIGH (ref 0.3–0.9)
Total Bilirubin: 1.4 mg/dL — ABNORMAL HIGH (ref 0.0–1.2)
Total Protein: 6.4 g/dL — ABNORMAL LOW (ref 6.5–8.1)

## 2024-05-07 LAB — PROCALCITONIN: Procalcitonin: <0.1 ng/mL

## 2024-05-07 LAB — BRAIN NATRIURETIC PEPTIDE: B Natriuretic Peptide: 712.1 pg/mL — ABNORMAL HIGH (ref 0.0–100.0)

## 2024-05-07 LAB — TROPONIN I (HIGH SENSITIVITY): Troponin I (High Sensitivity): 12 ng/L

## 2024-05-07 LAB — RESP PANEL BY RT-PCR (RSV, FLU A&B, COVID)  RVPGX2
Influenza A by PCR: NEGATIVE
Influenza B by PCR: NEGATIVE
Resp Syncytial Virus by PCR: NEGATIVE
SARS Coronavirus 2 by RT PCR: NEGATIVE

## 2024-05-07 LAB — MAGNESIUM: Magnesium: 2 mg/dL (ref 1.7–2.4)

## 2024-05-07 MED ORDER — ACETAMINOPHEN 325 MG PO TABS: 650.0000 mg | ORAL_TABLET | Freq: Four times a day (QID) | ORAL | Status: DC | PRN

## 2024-05-07 MED ORDER — LEVOFLOXACIN IN D5W 750 MG/150ML IV SOLN
750.0000 mg | Freq: Once | INTRAVENOUS | Status: AC
Start: 2024-05-07 — End: 2024-05-07
  Administered 2024-05-07: 750 mg via INTRAVENOUS
  Filled 2024-05-07: qty 150

## 2024-05-07 MED ORDER — PANTOPRAZOLE SODIUM 40 MG PO TBEC
40.0000 mg | DELAYED_RELEASE_TABLET | Freq: Every day | ORAL | Status: DC
  Administered 2024-05-07 – 2024-05-08 (×2): 40 mg via ORAL
  Filled 2024-05-07 (×2): qty 1

## 2024-05-07 MED ORDER — RIVAROXABAN 15 MG PO TABS
15.0000 mg | ORAL_TABLET | Freq: Every day | ORAL | Status: DC
  Administered 2024-05-07: 15 mg via ORAL
  Filled 2024-05-07: qty 1

## 2024-05-07 MED ORDER — ACETAMINOPHEN 650 MG RE SUPP: 650.0000 mg | Freq: Four times a day (QID) | RECTAL | Status: DC | PRN

## 2024-05-07 MED ORDER — METOPROLOL SUCCINATE ER 25 MG PO TB24
12.5000 mg | ORAL_TABLET | Freq: Every day | ORAL | Status: DC
  Administered 2024-05-08: 12.5 mg via ORAL
  Filled 2024-05-07: qty 1

## 2024-05-07 MED ORDER — ONDANSETRON HCL 4 MG PO TABS: 4.0000 mg | ORAL_TABLET | Freq: Four times a day (QID) | ORAL | Status: DC | PRN

## 2024-05-07 MED ORDER — ENOXAPARIN SODIUM 40 MG/0.4ML IJ SOSY: 40.0000 mg | PREFILLED_SYRINGE | INTRAMUSCULAR | Status: DC

## 2024-05-07 MED ORDER — GUAIFENESIN ER 600 MG PO TB12
600.0000 mg | ORAL_TABLET | Freq: Two times a day (BID) | ORAL | Status: DC
  Administered 2024-05-07 – 2024-05-08 (×2): 600 mg via ORAL
  Filled 2024-05-07 (×2): qty 1

## 2024-05-07 MED ORDER — ONDANSETRON HCL 4 MG/2ML IJ SOLN: 4.0000 mg | Freq: Four times a day (QID) | INTRAMUSCULAR | Status: DC | PRN

## 2024-05-07 MED ORDER — IPRATROPIUM-ALBUTEROL 0.5-2.5 (3) MG/3ML IN SOLN: 3.0000 mL | RESPIRATORY_TRACT | Status: DC | PRN

## 2024-05-07 MED ORDER — LORATADINE 10 MG PO TABS
10.0000 mg | ORAL_TABLET | Freq: Every day | ORAL | Status: DC
  Administered 2024-05-07 – 2024-05-08 (×2): 10 mg via ORAL
  Filled 2024-05-07 (×2): qty 1

## 2024-05-07 MED ORDER — BUDESONIDE 0.25 MG/2ML IN SUSP: 0.2500 mg | Freq: Two times a day (BID) | RESPIRATORY_TRACT | Status: DC

## 2024-05-07 MED ORDER — SENNOSIDES-DOCUSATE SODIUM 8.6-50 MG PO TABS: 1.0000 | ORAL_TABLET | Freq: Every evening | ORAL | Status: DC | PRN

## 2024-05-07 MED ORDER — IPRATROPIUM-ALBUTEROL 0.5-2.5 (3) MG/3ML IN SOLN
3.0000 mL | Freq: Four times a day (QID) | RESPIRATORY_TRACT | Status: DC
Start: 2024-05-07 — End: 2024-05-08
  Administered 2024-05-07 – 2024-05-08 (×2): 3 mL via RESPIRATORY_TRACT
  Filled 2024-05-07 (×2): qty 3

## 2024-05-07 MED ORDER — FLUTICASONE PROPIONATE 50 MCG/ACT NA SUSP
1.0000 | Freq: Every day | NASAL | Status: DC
  Administered 2024-05-07 – 2024-05-08 (×2): 1 via NASAL
  Filled 2024-05-07: qty 16

## 2024-05-07 MED ORDER — BUDESONIDE 0.5 MG/2ML IN SUSP
0.5000 mg | Freq: Two times a day (BID) | RESPIRATORY_TRACT | Status: DC
  Administered 2024-05-07 – 2024-05-08 (×2): 0.5 mg via RESPIRATORY_TRACT
  Filled 2024-05-07 (×2): qty 2

## 2024-05-07 MED ORDER — MELATONIN 5 MG PO TABS: 5.0000 mg | ORAL_TABLET | Freq: Every evening | ORAL | Status: DC | PRN

## 2024-05-07 MED ORDER — FUROSEMIDE 10 MG/ML IJ SOLN
40.0000 mg | Freq: Every day | INTRAMUSCULAR | Status: DC
  Administered 2024-05-08: 40 mg via INTRAVENOUS
  Filled 2024-05-07: qty 4

## 2024-05-07 MED ORDER — FUROSEMIDE 10 MG/ML IJ SOLN
40.0000 mg | Freq: Once | INTRAMUSCULAR | Status: AC
  Administered 2024-05-07: 40 mg via INTRAVENOUS
  Filled 2024-05-07: qty 4

## 2024-05-07 NOTE — ED Notes (Signed)
 Called CCMD to add pt to central monitoring

## 2024-05-07 NOTE — ED Triage Notes (Signed)
 Pt comes with sob and chest tightness. Pt states this started yesterday. Pt states it has gotten worse. Pt states abdomen and goes up to chest. Pt denies any N/V. Pt states hx of pancreatitis.

## 2024-05-07 NOTE — TOC CM/SW Note (Signed)
..  Transition of Care [facility]) - Inpatient Brief Assessment   Patient Details  Name: Brittney [name] MRN: [id number] Date of Birth: Jun 27, 1930  Transition of Care [facility]) CM/SW Contact:    Edsel DELENA Fischer, LCSW Phone Number: 05/07/2024, 6:14 PM   Clinical Narrative:  TOC to hand off to heart failure team  Transition of Care Asessment:

## 2024-05-07 NOTE — ED Provider Notes (Signed)
 [facility] Provider Note    Event Date/Time   First MD Initiated Contact with Patient 05/07/24 1135     (approximate)   History   Chest Pain and Shortness of Breath   HPI  Brittney [name] is a 89 y.o. female with a history of CKD, hypertension, anemia, and CHF who presents with shortness of breath since yesterday associated with some chest tightness and lower chest/epigastric discomfort when she takes a deep breath.  She denies any vomiting or diarrhea.  She has no fever or chills.  She states that the shortness of breath is worse with exertion although she feels some tightness when she is just lying still.  She denies any significant leg pain or swelling.  She states that she used to be on Lasix  but this was discontinued after she had an episode of pancreatitis.  The patient reports a mild cough.  I reviewed the past medical records.  The patient's most recent outpatient encounter was with Dr. Livia from nephrology on 9/3 for consultation on her CKD.   Physical Exam   Triage Vital Signs: ED Triage Vitals  Encounter Vitals Group     BP 05/07/24 1002 (!) 165/94     Girls Systolic BP Percentile --      Girls Diastolic BP Percentile --      Boys Systolic BP Percentile --      Boys Diastolic BP Percentile --      Pulse Rate 05/07/24 1002 80     Resp 05/07/24 1002 18     Temp 05/07/24 1002 98 F (36.7 C)     Temp src --      SpO2 05/07/24 1002 94 %     Weight 05/07/24 1004 150 lb 12.8 oz (68.4 kg)     Height 05/07/24 1004 4' 11 (1.499 m)     Head Circumference --      Peak Flow --      Pain Score 05/07/24 1003 7     Pain Loc --      Pain Education --      Exclude from Growth Chart --     Most recent vital signs: Vitals:   05/07/24 1230 05/07/24 1300  BP: (!) 165/84 (!) 163/85  Pulse: 83 (!) 102  Resp: (!) 21 20  Temp:    SpO2: 92% 95%     General: Alert, well-appearing for age, no distress.  CV:  Good peripheral perfusion.   Resp:  Normal effort.  Slightly diminished breath sounds bilateral bases. Abd:  Soft with no focal tenderness.  No distention.  Other:  No peripheral edema.   ED Results / Procedures / Treatments   Labs (all labs ordered are listed, but only abnormal results are displayed) Labs Reviewed  BASIC METABOLIC PANEL WITH GFR - Abnormal; Notable for the following components:      Result Value   Glucose, Bld 110 (*)    BUN 31 (*)    Creatinine, Ser 1.23 (*)    GFR, Estimated 41 (*)    All other components within normal limits  CBC - Abnormal; Notable for the following components:   RBC 3.33 (*)    Hemoglobin 9.5 (*)    HCT 31.1 (*)    RDW 15.8 (*)    Platelets 473 (*)    All other components within normal limits  BRAIN NATRIURETIC PEPTIDE - Abnormal; Notable for the following components:   B Natriuretic Peptide 712.1 (*)    All other  components within normal limits  HEPATIC FUNCTION PANEL - Abnormal; Notable for the following components:   Total Protein 6.4 (*)    Total Bilirubin 1.4 (*)    Indirect Bilirubin 1.2 (*)    All other components within normal limits  PROCALCITONIN  LIPASE, BLOOD  TROPONIN I (HIGH SENSITIVITY)     EKG  ED ECG REPORT I, Waylon Cassis, the attending physician, personally viewed and interpreted this ECG.  Date: 05/07/2024 EKG Time: 1002 Rate: 82 Rhythm: Atrial fibrillation QRS Axis: Left axis Intervals: normal ST/T Wave abnormalities: normal Narrative Interpretation: no evidence of acute ischemia    RADIOLOGY  Chest x-ray: I independently viewed and interpreted the images; there is cardiomegaly, bilateral small effusions, and bibasilar opacities  CT chest:  IMPRESSION:  1. Enlarging bilateral pleural effusions, small to moderate in size.  2. Persistent patchy mosaic pattern of ground-glass attenuation  typically seen with small airways disease such as asthma or  constrictive bronchiolitis.  3. Progressive lower lobe predominant  peribronchial thickening and  patchy airspace opacity suggesting bronchitis or early  bronchopneumonia.  4. Stable borderline enlarged mediastinal and hilar lymph nodes,  possibly reactive given the underlying lung findings.  5. Stable moderate-sized hiatal hernia.  6. Stable advanced vascular disease.  7. Aortic atherosclerosis.    PROCEDURES:  Critical Care performed: No  Procedures   MEDICATIONS ORDERED IN ED: Medications  furosemide  (LASIX ) injection 40 mg (has no administration in time range)  levofloxacin  (LEVAQUIN ) IVPB 750 mg (has no administration in time range)     IMPRESSION / MDM / ASSESSMENT AND PLAN / ED COURSE  I reviewed the triage vital signs and the nursing notes.  89 year old female with PMH as noted above presents with exertional shortness of breath and atypical chest discomfort since yesterday.  On exam she is quite well-appearing for her age.  Her vital signs are normal.  Physical exam is unremarkable for acute findings.  Slight  Differential diagnosis includes, but is not limited to, CHF exacerbation, pneumonia, acute bronchitis, viral syndrome.  I do not suspect PE given lack of tachycardia or significant chest pain.  Chest x-ray shows bilateral opacities, possibly suggestive of CHF but infectious etiology cannot be ruled out.  BMP and CBC show no acute findings; the patient has chronic anemia.  Troponin is negative.  Given the duration of the symptoms there is no indication for repeat.  We will obtain a BNP, procalcitonin, LFTs and lipase (given the past history of pancreatitis with somewhat similar symptoms) and a CT to further evaluate the lung findings.  The patient has not had any contrast CTs in the last several years I believe due to her concern for her CKD.  Therefore, we will forego a CTA at this time to obtain a CT without contrast.  Patient's presentation is most consistent with acute complicated illness / injury requiring diagnostic workup.  The  patient is on the cardiac monitor to evaluate for evidence of arrhythmia and/or significant heart rate changes.  ----------------------------------------- 3:18 PM on 05/07/2024 -----------------------------------------  CT shows evidence of bronchopneumonia but also pleural effusions consistent with CHF.  BNP is elevated.  I have ordered both Lasix  and antibiotics.  Although the patient's O2 saturation is mostly in the low 90s, she has significant shortness of breath with minimal exertion.  Given her age and the combination of multiple etiologies of her symptoms revealed by the CT, I feel that she will benefit from inpatient admission.  I consulted Dr. Lenon from the hospitalist service;  based on our discussion she agreed to evaluate the patient for admission.   FINAL CLINICAL IMPRESSION(S) / ED DIAGNOSES   Final diagnoses:  Community acquired pneumonia, unspecified laterality  Pleural effusion     Rx / DC Orders   ED Discharge Orders     None        Note:  This document was prepared using Dragon voice recognition software and may include unintentional dictation errors.    Jacolyn Pae, MD 05/07/24 1520

## 2024-05-07 NOTE — H&P (Addendum)
 History and Physical    Brittney [name] FMW:[id number] DOB: 03/18/1930 DOA: 05/07/2024  DOS: the patient was seen and examined on 05/07/2024  PCP: [name], NP   Patient coming from: Home  I have personally briefly reviewed patient's old medical records in [facility] and CareEverywhere  HPI:  Brittney [name] is a 89 y.o. year old female with past medical history of  tia, htn, cad (non-obstructive), ckd 3, gout, HFpEF, and atrial fibrillation.  She presented to [facility] ED with reports of chest pain and back pain with deep inspiration and dyspnea on exertion that began this morning, though she told EDP symptoms began yesterday.  She declined to undergo CTA PE citing concern with contrast dye and kidney function.  She denies palpitations, fever, chills, abdominal pain, vomiting, nausea, or diarrhea.  She denies any known sick contacts, denies cough, sore throat, congestion, or rhinorrhea.  Reports she no longer takes Lasix  and has not in years after an episode of pancreatitis.  ED Course: On arrival to [facility] regional ED patient was noted to be afebrile temp 36.7 C, BP 165/95, HR 80, RR 18, SpO2 94% on room air.  CXR obtained and shows small bilateral pleural effusions, bibasilar opacities, and mild CHF.  CT chest obtained showed lower lobe peribronchial thickening, bilateral pleural effusions, and ground glass attenuation.  Left notable for creatinine 1.23 which is lower than recent baseline 1.5-1.6, Hemoglobin 9.5, platelets 473, troponin 12, BNP 712.1, procalcitonin <0.10.  She was given 40 mg IV Lasix  and started on Levaquin . TRH contacted for admission.    Review of Systems: As mentioned in the history of present illness. All other systems reviewed and are negative.  Review of Systems  Constitutional:  Negative for chills, fever and malaise/fatigue.  HENT:  Negative for congestion and sore throat.   Respiratory:  Negative for cough.   Cardiovascular:  Positive  for chest pain. Negative for palpitations and leg swelling.  Gastrointestinal:  Negative for abdominal pain, diarrhea, nausea and vomiting.  Musculoskeletal:  Positive for back pain.  Neurological:  Negative for dizziness, focal weakness, weakness and headaches.    Past Medical History:  Diagnosis Date   Adopted person    Allergy    Anemia    Angina pectoris (HCC)    Aortic atherosclerosis (HCC)    Arthritis    Atrial fibrillation (HCC)    a. ) CHA2DS2-VASc = 7 (age x 2, sex, HTN, TIA x2, aortic plaque);  b.) rate/rhythm maintained on oral metoprolol  succinate; chronically anticoagulated using dose reduced apixaban .   Bilateral carotid artery disease (HCC)    CAD (coronary artery disease)    a.) LHC 02/09/2007: EF 60%, LVEDP 25 mmHg, normal cors; b.) cCTA 11/27/2011: Ca score 129.73 --> 1.17 RCA, 16.36 LM, 112.21 LAD, 0 LCx; c.) MPI 03/13/2014 - normal.   Cataract    CKD (chronic kidney disease), stage IV (HCC)    Diastolic dysfunction 02/09/2008   a.) LHC 02/09/2008: EF 60%, LVEDP 25 mmHg; b.) TTE 08/07/2010: EF 65%, mild LVH, mild MR, G2DD; b.) TTE 08/20/2018: EF 45-50%, inferior HK, mild LAE; c.) TTE 08/11/2021: EF 45-50%. 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   Left carotid bruit    Long term current use of anticoagulant    a.) apixaban   Lumbar stenosis    Lymphocytic colitis    Osteoporosis    TIA (transient ischemic attack) 07/2018   TIA (transient ischemic attack) 08/2021   Vitamin B12 deficiency    Wears hearing BILATERAL aids    Wears partial (bottom) dentures     Past Surgical History:  Procedure Laterality Date   BREAST EXCISIONAL BIOPSY Right 36 years ago   neg   BUNIONECTOMY Bilateral    COLONOSCOPY N/A 07/02/2008   KNEE ARTHROPLASTY Right 03/11/2022   Procedure: COMPUTER ASSISTED TOTAL  KNEE ARTHROPLASTY;  Surgeon: Mardee Lynwood SQUIBB, MD;  Location: [facility] ORS;  Service: Orthopedics;  Laterality: Right;   KNEE ARTHROSCOPY Right 2006   LEFT HEART CATH AND CORONARY ANGIOGRAPHY Left 02/09/2007   Procedure: LEFT HEART CATH AND CORONARY ANGIOGRAPHY; Location: [facility]   TONSILLECTOMY AND ADENOIDECTOMY Bilateral    TOTAL ABDOMINAL HYSTERECTOMY W/ BILATERAL SALPINGOOPHORECTOMY N/A    TOTAL KNEE ARTHROPLASTY Left 04/26/2014   TUBAL LIGATION       reports that she has never smoked. She has been exposed to tobacco smoke. She has never used smokeless tobacco. She reports that she does not currently use alcohol. She reports that she does not use drugs.  Allergies  Allergen Reactions   Lisinopril Swelling   Mixed Grasses Anaphylaxis   Bee Venom Swelling   Azithromycin Diarrhea   Baclofen Diarrhea   Iodine     CKD   Morphine  And Codeine Nausea And Vomiting   [place] Clam (M. Mercenaria) Skin Test Diarrhea    Mussels.   Sulfa Antibiotics Rash    Family History  Adopted: Yes  Problem Relation Age of Onset   Heart disease Sister    Stroke Brother     Prior to Admission medications   Medication Sig Start Date End Date Taking? Authorizing Provider  acetaminophen  (TYLENOL ) 500 MG tablet Take 1,000 mg by mouth 2 (two) times daily as needed.    [provider]  allopurinol  (ZYLOPRIM ) 100 MG tablet Take 0.5 tablets (50 mg total) by mouth daily. 01/04/24   [name], NP  Cholecalciferol  (VITAMIN D ) 50 MCG (2000 UT) tablet Take 2,000 Units by mouth daily.    [provider]  Cyanocobalamin  (B-12) 1000 MCG TABS TAKE ONE TABLET BY MOUTH EVERY DAY 04/13/24   [name], NP  EPINEPHrine  0.3 mg/0.3 mL IJ SOAJ injection Inject 0.3 mg into the muscle as needed for anaphylaxis. 12/07/23   [name], NP  fluticasone  (FLONASE ) 50 MCG/ACT nasal spray Place 2 sprays into both nostrils as needed. 12/07/23   [name], NP  furosemide   (LASIX ) 20 MG tablet Take 0.5 tablets (10 mg total) by mouth daily as needed (shortness of breath, or swelling, or daily weight gain greater than 3 pounds). Patient not taking: Reported on 04/20/2024 09/07/23   Abigail Bernardino HERO, PA-C  loratadine  (CLARITIN ) 10 MG tablet Take 10 mg by mouth daily.    [provider]  metoprolol  succinate (TOPROL -XL) 25 MG 24 hr tablet Take 0.5 tablets (12.5 mg total) by mouth daily. 04/29/23   Dunn, Bernardino HERO, PA-C  olopatadine  (PATANOL) 0.1 % ophthalmic solution Place 1 drop into both eyes 2 (two) times daily as needed for allergies. 12/07/23   [name], NP  pantoprazole  (PROTONIX ) 40 MG tablet Take 40 mg by mouth daily. 12/07/23   [provider]  Vibegron  (GEMTESA ) 75 MG TABS Take 1 tablet (75 mg total) by mouth daily. 04/20/24   [name], NP  XARELTO  15 MG TABS tablet TAKE ONE TABLET BY MOUTH EVERY DAY WITH SUPPER 10/05/23   Abigail Bernardino HERO, PA-C    Physical Exam: Vitals:   05/07/24 1130 05/07/24 1200 05/07/24 1230 05/07/24 1300  BP: (!) 176/74 (!) 162/82 (!) 165/84 (!) 163/85  Pulse: 78 63 83 (!) 102  Resp: 15 19 (!) 21 20  Temp:      SpO2: 98% 98% 92% 95%  Weight:      Height:        Physical Exam Vitals and nursing note reviewed.  HENT:     Head: Normocephalic.  Eyes:     Pupils: Pupils are equal, round, and reactive to light.  Cardiovascular:     Rate and Rhythm: Normal rate and regular rhythm.  Pulmonary:     Effort: Pulmonary effort is normal.  Neurological:     Mental Status: She is alert.      Labs on Admission: I have personally reviewed following labs and imaging studies  CBC: Recent Labs  Lab 05/07/24 1006  WBC 7.5  HGB 9.5*  HCT 31.1*  MCV 93.4  PLT 473*   Basic Metabolic Panel: Recent Labs  Lab 05/07/24 1006  NA 140  K 4.2  CL 108  CO2 22  GLUCOSE 110*  BUN 31*  CREATININE 1.23*  CALCIUM  9.5   GFR: Estimated Creatinine Clearance: 24.6 mL/min (A) (by C-G formula based on SCr of 1.23  mg/dL (H)). Liver Function Tests: Recent Labs  Lab 05/07/24 1106  AST 21  ALT 7  ALKPHOS 55  BILITOT 1.4*  PROT 6.4*  ALBUMIN 3.8   Recent Labs  Lab 05/07/24 1106  LIPASE 31   No results for input(s): AMMONIA in the last 168 hours. Coagulation Profile: No results for input(s): INR, PROTIME in the last 168 hours. Cardiac Enzymes: Recent Labs  Lab 05/07/24 1006  TROPONINIHS 12   BNP (last 3 results) Recent Labs    05/07/24 1106  BNP 712.1*   HbA1C: No results for input(s): HGBA1C in the last 72 hours. CBG: No results for input(s): GLUCAP in the last 168 hours. Lipid Profile: No results for input(s): CHOL, HDL, LDLCALC, TRIG, CHOLHDL, LDLDIRECT in the last 72 hours. Thyroid Function Tests: No results for input(s): TSH, T4TOTAL, FREET4, T3FREE, THYROIDAB in the last 72 hours. Anemia Panel: No results for input(s): VITAMINB12, FOLATE, FERRITIN, TIBC, IRON, RETICCTPCT in the last 72 hours. Urine analysis:    Component Value Date/Time   COLORURINE YELLOW (A) 02/27/2022 1257   APPEARANCEUR HAZY (A) 02/27/2022 1257   LABSPEC 1.018 02/27/2022 1257   PHURINE 5.0 02/27/2022 1257   GLUCOSEU NEGATIVE 02/27/2022 1257   HGBUR NEGATIVE 02/27/2022 1257   BILIRUBINUR NEGATIVE 02/27/2022 1257   KETONESUR NEGATIVE 02/27/2022 1257   PROTEINUR NEGATIVE 02/27/2022 1257   NITRITE NEGATIVE 02/27/2022 1257   LEUKOCYTESUR MODERATE (A) 02/27/2022 1257    Radiological Exams on Admission: I have personally reviewed images CT CHEST WO CONTRAST Result Date: 05/07/2024 CLINICAL DATA:  Shortness of breath and chest tightness. EXAM: CT CHEST WITHOUT CONTRAST TECHNIQUE: Multidetector CT imaging of the chest was performed following the standard protocol without IV contrast. RADIATION DOSE REDUCTION: This exam was performed according to the departmental dose-optimization program which includes automated exposure control, adjustment of the mA and/or kV  according to patient size and/or use of iterative reconstruction technique. COMPARISON:  Chest x-ray, same date.  Prior CT scan 03/27/2024 FINDINGS: Cardiovascular: The heart is within normal limits in size for the patient's age.  No pericardial effusion. Stable tortuosity, ectasia and calcification of the thoracic aorta. Stable three-vessel coronary artery calcifications. Mediastinum/Nodes: Stable borderline enlarged mediastinal and hilar lymph nodes, possibly reactive given the underlying lung findings. Anterior mediastinal node on image 52/2 measures 9.5 mm. Precarinal lymph node measures 11 mm. Subcarinal node measures 16 mm. Prevascular node measures 9 mm. The esophagus is unremarkable. There is a stable moderate-sized hiatal hernia. Lungs/Pleura: Enlarging bilateral pleural effusions, small to moderate in size. Overlying atelectasis noted. Persistent patchy mosaic pattern of ground-glass attenuation typically seen with small airways disease such as asthma constrictive bronchiolitis. Progressive lower lobe predominant peribronchial thickening and patchy airspace opacity suggesting bronchitis or early bronchopneumonia. No focal airspace consolidation. No discrete worrisome pulmonary lesion. The central tracheobronchial tree is unremarkable. Upper Abdomen: No significant upper abdominal findings. Stable hepatic cysts. Stable advanced vascular disease. Stable bilateral renal cysts. Musculoskeletal: No significant bony findings. Stable osteoporosis and remote thoracic compression fractures. IMPRESSION: 1. Enlarging bilateral pleural effusions, small to moderate in size. 2. Persistent patchy mosaic pattern of ground-glass attenuation typically seen with small airways disease such as asthma or constrictive bronchiolitis. 3. Progressive lower lobe predominant peribronchial thickening and patchy airspace opacity suggesting bronchitis or early bronchopneumonia. 4. Stable borderline enlarged mediastinal and hilar lymph  nodes, possibly reactive given the underlying lung findings. 5. Stable moderate-sized hiatal hernia. 6. Stable advanced vascular disease. 7. Aortic atherosclerosis. Aortic Atherosclerosis (ICD10-I70.0). Electronically Signed   By: MYRTIS Stammer M.D.   On: 05/07/2024 14:01   DG Chest 2 View Result Date: 05/07/2024 EXAM: 2 VIEW(S) XRAY OF THE CHEST 05/07/2024 10:16:00 AM COMPARISON: 01/11/2024 CLINICAL HISTORY: Pt comes with SOB and chest tightness. Pt states this started yesterday. Pt states it has gotten worse. Pt states abdomen and goes up to chest. Pt denies any N/V. Pt states hx of pancreatitis. FINDINGS: LUNGS AND PLEURA: Bibasilar airspace opacities, which may reflect atelectasis or airspace disease. . Interstitial prominence compatible with mild edema . Small bilateral pleural effusions. HEART AND MEDIASTINUM: Cardiac enlargement. No acute abnormality of the cardiac and mediastinal silhouettes. Atherosclerotic plaque noted. BONES AND SOFT TISSUES: Thoracic spondylosis. Chronic multilevel compression deformities within the thoracic spine. No acute osseous abnormality. IMPRESSION: 1. Bibasilar airspace cardiac enlargement and mild congestive heart failure. 2. Bibasilar opacities compatible with atelectasis or airspace disease. 3. Small bilateral pleural effusions. Electronically signed by: Waddell Calk MD 05/07/2024 10:47 AM EDT RP Workstation: [id number]    EKG: My personal interpretation of EKG shows: AFIB,  HR 82    Assessment/Plan Principal Problem:   Acute exacerbation of CHF (congestive heart failure) (HCC)   #Acute on chronic diastolic CHF #Small to Moderate Bilateral Pleural Effusions LVEF 60-65% in Feb 2024 with Grade II diastolic dysfunction - IV Lasix  40 mg daily - ECHO - Strict I/Os - Check Lipid panel, HGB A1C, TSH - Continue home Metoprolol  - GDMT limited by CKD  ##?Acute Bronchitis No wheezing on exam, patient denies wheezing at home.  Will not treat for pneumonia at this  time as patient has no leukocytosis, afebrile, and no URI symptoms. - Scheduled Pulmicort  and Duonebs - Check respiratory panel - Claritin , Flonase , Mucinex   #Atrial fibrillation -Continue home Eliquis  and metoprolol   #Hypertension -Continue home metoprolol   #Chronic kidney disease stage IIIb Stable.  Creatinine 1.23 with recent baseline 1.5-1.6  #GERD -  Protonix   #Chronic Anemia - Hemoglobin 9.5 and stable  VTE prophylaxis:  Xarelto   GI prophylaxis: Protonix  Diet: Heart healthy fluid restriction Access: PIV Lines: None Code Status:  DNR/DNI(Do NOT Intubate)  confirmed with patient at bedside Telemetry: Yes  Admission status: Inpatient, Telemetry bed Patient is from: Home Anticipated d/c is to: Home Anticipated d/c date is: 2 to 3 days Patient currently: Receiving IV Lasix , pending cardiology consultation, and symptomatic improvement  Family Communication: Friend at bedside  Consults called: N/A    Severity of Illness: The appropriate patient status for this patient is INPATIENT. Inpatient status is judged to be reasonable and necessary in order to provide the required intensity of service to ensure the patient's safety. The patient's presenting symptoms, physical exam findings, and initial radiographic and laboratory data in the context of their chronic comorbidities is felt to place them at high risk for further clinical deterioration. Furthermore, it is not anticipated that the patient will be medically stable for discharge from the hospital within 2 midnights of admission.   * I certify that at the point of admission it is my clinical judgment that the patient will require inpatient hospital care spanning beyond 2 midnights from the point of admission due to high intensity of service, high risk for further deterioration and high frequency of surveillance required.*  To reach the provider On-Call:   7AM- 7PM see care teams to locate the attending and reach out to them  via [email or web address]. Password: [email or web address] 7PM-7AM contact night-coverage If you still have difficulty reaching the appropriate provider, please page the [facility] (Director on Call) for [facility] on amion for assistance  This document was prepared using Conservation officer, historic buildings and may include unintentional dictation errors.  Rockie Rams FNP-BC, PMHNP-BC Nurse Practitioner [facility]

## 2024-05-07 NOTE — ED Notes (Signed)
 Pt c/o of SOB and CP that started yesterday morning when they woke up. Pt states that they had trouble breathing during the night and sleeps with the head of their bed elevated, which did not provide any relief. Pt states that they had some wheezing this morning when they woke up. Pt also reports not being able to carrying anything and feels weaker than their normal which also started yesterday.

## 2024-05-07 NOTE — ED Notes (Signed)
 Called nutrition about pt's dinner tray, they are printing a new ticket and food will be around shortly.

## 2024-05-07 NOTE — ED Notes (Signed)
 Assisted the pt to the bathroom.

## 2024-05-08 ENCOUNTER — Inpatient Hospital Stay
Admit: 2024-05-08 | Discharge: 2024-05-08 | Disposition: A | Discharge status: Home / Self Care | Attending: Internal Medicine

## 2024-05-08 DIAGNOSIS — I5023 Acute on chronic systolic (congestive) heart failure: Secondary | ICD-10-CM | POA: Diagnosis not present

## 2024-05-08 DIAGNOSIS — I509 Heart failure, unspecified: Secondary | ICD-10-CM | POA: Diagnosis not present

## 2024-05-08 LAB — RESPIRATORY PANEL BY PCR

## 2024-05-08 LAB — BASIC METABOLIC PANEL WITH GFR
Anion gap: 14 (ref 5–15)
BUN: 31 mg/dL — ABNORMAL HIGH (ref 8–23)
CO2: 27 mmol/L (ref 22–32)
Calcium: 9.8 mg/dL (ref 8.9–10.3)
Chloride: 99 mmol/L (ref 98–111)
Creatinine, Ser: 1.56 mg/dL — ABNORMAL HIGH (ref 0.44–1.00)
GFR, Estimated: 31 mL/min — ABNORMAL LOW (ref >60)
Glucose, Bld: 100 mg/dL — ABNORMAL HIGH (ref 70–99)
Potassium: 3.7 mmol/L (ref 3.5–5.1)
Sodium: 140 mmol/L (ref 135–145)

## 2024-05-08 LAB — CBC
HCT: 31.9 % — ABNORMAL LOW (ref 36.0–46.0)
Hemoglobin: 9.9 g/dL — ABNORMAL LOW (ref 12.0–15.0)
MCH: 28.4 pg (ref 26.0–34.0)
MCHC: 31 g/dL (ref 30.0–36.0)
MCV: 91.4 fL (ref 80.0–100.0)
Platelets: 480 K/uL — ABNORMAL HIGH (ref 150–400)
RBC: 3.49 MIL/uL — ABNORMAL LOW (ref 3.87–5.11)
RDW: 15.7 % — ABNORMAL HIGH (ref 11.5–15.5)
WBC: 7.6 K/uL (ref 4.0–10.5)
nRBC: 0 % (ref 0.0–0.2)

## 2024-05-08 LAB — ECHOCARDIOGRAM COMPLETE
Area-P 1/2: 5.79 cm2
Height: 59 in
P 1/2 time: 556 ms
S' Lateral: 3.18 cm
Weight: 2412.78 [oz_av]

## 2024-05-08 LAB — LIPID PANEL
Cholesterol: 152 mg/dL (ref 0–200)
HDL: 58 mg/dL (ref >40)
LDL Cholesterol: 82 mg/dL (ref 0–99)
Total CHOL/HDL Ratio: 2.6 ratio
Triglycerides: 60 mg/dL (ref <150)
VLDL: 12 mg/dL (ref 0–40)

## 2024-05-08 LAB — HEMOGLOBIN A1C
Hgb A1c MFr Bld: 5.4 % (ref 4.8–5.6)
Mean Plasma Glucose: 108.28 mg/dL

## 2024-05-08 LAB — TSH: TSH: 3.433 u[IU]/mL (ref 0.350–4.500)

## 2024-05-08 LAB — MAGNESIUM: Magnesium: 2 mg/dL (ref 1.7–2.4)

## 2024-05-08 NOTE — Evaluation (Signed)
 Physical Therapy Evaluation Patient Details Name: Anuja Manka MRN: [id number] DOB: [birth year] Today's Date: 05/08/2024  History of Present Illness  Pt is a 89 y.o. year old female with past medical history of TIA, HTN, CAD (non-obstructive), CKD 3, gout, HFpEF, and atrial fibrillation.  She presented to [facility] ED with reports of chest pain and back pain with deep inspiration and dyspnea on exertion. MD assessment includes: acute on chronic diastolic CHF and possible bronchitis.   Clinical Impression  Pt was pleasant and motivated to participate during the session and put forth good effort throughout. Pt was Ind with all functional tasks per below including ambulation without an AD.  Pt was steady with dynamic standing tasks without UE support other than ambulation including standing activities with reaching outside BOS.  Pt reported no adverse symptoms during the session with SpO2 and HR WNL throughout on room air.  No skilled PT needs identified at this time with pt in agreement and declining further PT services.  Will complete PT orders at this time but will reassess pt pending a change in status upon receipt of new PT orders.           If plan is discharge home, recommend the following: Assist for transportation   Can travel by private vehicle        Equipment Recommendations None recommended by PT  Recommendations for Other Services       Functional Status Assessment Patient has not had a recent decline in their functional status     Precautions / Restrictions Precautions Precautions: Fall Restrictions Weight Bearing Restrictions Per Provider Order: No      Mobility  Bed Mobility Overal bed mobility: Independent                  Transfers Overall transfer level: Independent                 General transfer comment: Good eccentric and concentric control and stability from various height surfaces    Ambulation/Gait Ambulation/Gait  assistance: Independent Gait Distance (Feet): 125 Feet Assistive device: Rolling walker (2 wheels), None Gait Pattern/deviations: WFL(Within Functional Limits) Gait velocity: WFL     General Gait Details: Pt self-selected use of RW initially but progressed to amb without an AD with good stability throughout including during start/stops and 180 deg turns  Careers information officer     Tilt Bed    Modified Rankin (Stroke Patients Only)       Balance Overall balance assessment: No apparent balance deficits (not formally assessed)                                           Pertinent Vitals/Pain Pain Assessment Pain Assessment: No/denies pain    Home Living Family/patient expects to be discharged to:: Private residence Living Arrangements: Alone Available Help at Discharge: Other (Comment) Counselling psychologist at [facility] available as needed) Type of Home: Independent living facility Home Access: Level entry       Home Layout: One level Home Equipment: Grab bars - tub/shower;Grab bars - toilet;Rolling Walker (2 wheels);Rollator (4 wheels);Cane - single point;Shower seat Additional Comments: Lives at [facility] in a single family home    Prior Function Prior Level of Function : Independent/Modified Independent  Mobility Comments: Ind amb community distances without an AD, no fall history, active at [facility] ADLs Comments: Ind with ADLs     Extremity/Trunk Assessment   Upper Extremity Assessment Upper Extremity Assessment: Overall WFL for tasks assessed    Lower Extremity Assessment Lower Extremity Assessment: Overall WFL for tasks assessed       Communication   Communication Communication: No apparent difficulties    Cognition Arousal: Alert Behavior During Therapy: WFL for tasks assessed/performed   PT - Cognitive impairments: No apparent impairments                         Following commands:  Intact       Cueing Cueing Techniques: Verbal cues     General Comments      Exercises     Assessment/Plan    PT Assessment Patient does not need any further PT services  PT Problem List         PT Treatment Interventions      PT Goals (Current goals can be found in the Care Plan section)  Acute Rehab PT Goals PT Goal Formulation: All assessment and education complete, DC therapy    Frequency       Co-evaluation               AM-PAC PT 6 Clicks Mobility  Outcome Measure Help needed turning from your back to your side while in a flat bed without using bedrails?: None Help needed moving from lying on your back to sitting on the side of a flat bed without using bedrails?: None Help needed moving to and from a bed to a chair (including a wheelchair)?: None Help needed standing up from a chair using your arms (e.g., wheelchair or bedside chair)?: None Help needed to walk in hospital room?: None Help needed climbing 3-5 steps with a railing? : None 6 Click Score: 24    End of Session Equipment Utilized During Treatment: Gait belt Activity Tolerance: Patient tolerated treatment well Patient left: in bed Nurse Communication: Mobility status;Other (comment) (Pt requested to be left sitting on the side of the bed to eat breakfast, nsg notified) PT Visit Diagnosis: Muscle weakness (generalized) (M62.81)    Time: 9094-9073 PT Time Calculation (min) (ACUTE ONLY): 21 min   Charges:   PT Evaluation $PT Eval Low Complexity: 1 Low   PT General Charges $$ ACUTE PT VISIT: 1 Visit    D. Scott Odell Choung PT, DPT 05/08/24, 10:19 AM

## 2024-05-08 NOTE — Discharge Instructions (Addendum)
 Please review the heart failure action plan information. This can help guide you in using lasix  as needed.  If you are only needing to take it rarely, your risk of pancreatitis is small but it is up to you.  You can also contact your cardiologist if concerned about warning signs that you may be having some worsening heart failure.   We will discharge you home once your viral panel has resulted so you can see the results. They are also available on mychart if you have access to that resource.

## 2024-05-08 NOTE — Progress Notes (Signed)
 OT Cancellation Note  Patient Details Name: Brittney [name] MRN: [id number] DOB: 1929-09-01   Cancelled Treatment:    Reason Eval/Treat Not Completed: OT screened, no needs identified, will sign off. Per staff report, pt is at baseline with mobility and self care. Pt declines OT evaluation. OT to sign off at this time.   Izetta Claude, MS, OTR/L , CBIS ascom [phone]  05/08/24, 9:41 AM

## 2024-05-08 NOTE — Discharge Summary (Signed)
 Physician Discharge Summary  Patient: Brittney [name] FMW:[id number] DOB: 1929-11-11   Code Status: Limited: Do not attempt resuscitation (DNR) -DNR-LIMITED -Do Not Intubate/DNI  Admit date: 05/07/2024 Discharge date: 05/08/2024 Disposition: Home, No home health services recommended PCP: [name], NP  Recommendations for Outpatient Follow-up:  Follow up with PCP within 1-2 weeks Regarding general hospital follow up and preventative care Recommend BMP, CBC Follow up with cardiology to review diuretics  Discharge Diagnoses:  Principal Problem:   Acute exacerbation of CHF (congestive heart failure) (HCC) Active Problems:   CKD (chronic kidney disease) stage 3, GFR 30-59 ml/min (HCC)   Essential hypertension   Hyperlipidemia   CAD (coronary artery disease)   Atrial fibrillation (HCC)   Gastroesophageal reflux disease without esophagitis  Brief Hospital Course Summary: Brittney [name] is a 89 y.o. year old female with past medical history of  tia, htn, cad (non-obstructive), ckd 3, gout, HFpEF, and atrial fibrillation.  She presented to [facility] ED with reports of chest pain and back pain with deep inspiration and dyspnea on exertion that began this morning, though she told EDP symptoms began yesterday.  She declined to undergo CTA PE citing concern with contrast dye and kidney function.  She denies palpitations, fever, chills, abdominal pain, vomiting, nausea, or diarrhea.  She denies any known sick contacts, denies cough, sore throat, congestion, or rhinorrhea.  Reports she no longer takes Lasix  and has not in years after an episode of pancreatitis.   ED Course: On arrival to [facility] regional ED patient was noted to be afebrile temp 36.7 C, BP 165/95, HR 80, RR 18, SpO2 94% on room air.  CXR obtained and shows small bilateral pleural effusions, bibasilar opacities, and mild CHF.  CT chest obtained showed lower lobe peribronchial thickening, bilateral pleural  effusions, and ground glass attenuation.  Left notable for creatinine 1.23 which is lower than recent baseline 1.5-1.6, Hemoglobin 9.5, platelets 473, troponin 12, BNP 712.1, procalcitonin <0.10.  She was given 40 mg IV Lasix  and started on Levaquin . [facility] contacted for admission.    All other chronic conditions were treated with home medications.    Discharge Condition: Good, improved Recommended discharge diet: Regular healthy diet  Consultations: None   Procedures/Studies: None   Allergies as of 05/08/2024       Reactions   Lisinopril Swelling   Mixed Grasses Anaphylaxis   Bee Venom Swelling   Lactose Other (See Comments)   LACTOSE INTOLERANCE   Azithromycin Diarrhea   Baclofen Diarrhea   Iodine    CKD   Morphine  And Codeine Nausea And Vomiting   [employer], production (m. Mercenaria) Skin Test Diarrhea   Mussels.   Oyster Extract Diarrhea   Mussels.   Sulfa Antibiotics Rash        Medication List     STOP taking these medications    loratadine  10 MG tablet Commonly known as: CLARITIN    pantoprazole  40 MG tablet Commonly known as: PROTONIX        TAKE these medications    acetaminophen  500 MG tablet Commonly known as: TYLENOL  Take 1,000 mg by mouth 2 (two) times daily as needed.   allopurinol  100 MG tablet Commonly known as: ZYLOPRIM  Take 0.5 tablets (50 mg total) by mouth daily.   B-12 1000 MCG Tabs TAKE ONE TABLET BY MOUTH EVERY DAY   EPINEPHrine  0.3 mg/0.3 mL Soaj injection Commonly known as: EPI-PEN Inject 0.3 mg into the muscle as needed for anaphylaxis.   fluticasone  50 MCG/ACT nasal  spray Commonly known as: FLONASE  Place 2 sprays into both nostrils as needed.   furosemide  20 MG tablet Commonly known as: LASIX  Take 0.5 tablets (10 mg total) by mouth daily as needed (shortness of breath, or swelling, or daily weight gain greater than 3 pounds).   Gemtesa  75 MG Tabs Generic drug: Vibegron  Take 1 tablet (75 mg total) by mouth daily.    metoprolol  succinate 25 MG 24 hr tablet Commonly known as: TOPROL -XL Take 0.5 tablets (12.5 mg total) by mouth daily.   olopatadine  0.1 % ophthalmic solution Commonly known as: PATANOL Place 1 drop into both eyes 2 (two) times daily as needed for allergies.   Vitamin D  50 MCG (2000 UT) tablet Take 2,000 Units by mouth daily.   Xarelto  15 MG Tabs tablet Generic drug: Rivaroxaban  TAKE ONE TABLET BY MOUTH EVERY DAY WITH SUPPER        Follow-up Information     [name], NP. Schedule an appointment as soon as possible for a visit in 1 week(s).   Specialty: Geriatric Medicine Contact information: [address] [place] [address] [phone]         [name], MD. Schedule an appointment as soon as possible for a visit.   Specialties: Cardiology, Radiology Why: If symptoms worsen Contact information: [address] [place] [address] [phone]                 Subjective   Pt reports feels well. She denies SOB or CP. Has been ambulating to bathroom today without concerns.   All questions and concerns were addressed at time of discharge.  Objective  Blood pressure (!) 139/109, pulse (!) 101, temperature 98 F (36.7 C), temperature source Oral, resp. rate (!) 26, height 4' 11 (1.499 m), weight 68.4 kg, SpO2 97%.   General: Pt is alert, awake, not in acute distress Cardiovascular: RRR, S1/S2 +, no rubs, no gallops Respiratory: CTA bilaterally, no wheezing, no rhonchi Abdominal: Soft, NT, ND, bowel sounds + Extremities: no edema, no cyanosis  The results of significant diagnostics from this hospitalization (including imaging, microbiology, ancillary and laboratory) are listed below for reference.   Imaging studies: CT CHEST WO CONTRAST Result Date: 05/07/2024 CLINICAL DATA:  Shortness of breath and chest tightness. EXAM: CT CHEST WITHOUT CONTRAST TECHNIQUE: Multidetector CT imaging of the chest was performed following the  standard protocol without IV contrast. RADIATION DOSE REDUCTION: This exam was performed according to the departmental dose-optimization program which includes automated exposure control, adjustment of the mA and/or kV according to patient size and/or use of iterative reconstruction technique. COMPARISON:  Chest x-ray, same date.  Prior CT scan 03/27/2024 FINDINGS: Cardiovascular: The heart is within normal limits in size for the patient's age. No pericardial effusion. Stable tortuosity, ectasia and calcification of the thoracic aorta. Stable three-vessel coronary artery calcifications. Mediastinum/Nodes: Stable borderline enlarged mediastinal and hilar lymph nodes, possibly reactive given the underlying lung findings. Anterior mediastinal node on image 52/2 measures 9.5 mm. Precarinal lymph node measures 11 mm. Subcarinal node measures 16 mm. Prevascular node measures 9 mm. The esophagus is unremarkable. There is a stable moderate-sized hiatal hernia. Lungs/Pleura: Enlarging bilateral pleural effusions, small to moderate in size. Overlying atelectasis noted. Persistent patchy mosaic pattern of ground-glass attenuation typically seen with small airways disease such as asthma constrictive bronchiolitis. Progressive lower lobe predominant peribronchial thickening and patchy airspace opacity suggesting bronchitis or early bronchopneumonia. No focal airspace consolidation. No discrete worrisome pulmonary lesion. The central tracheobronchial tree is  unremarkable. Upper Abdomen: No significant upper abdominal findings. Stable hepatic cysts. Stable advanced vascular disease. Stable bilateral renal cysts. Musculoskeletal: No significant bony findings. Stable osteoporosis and remote thoracic compression fractures. IMPRESSION: 1. Enlarging bilateral pleural effusions, small to moderate in size. 2. Persistent patchy mosaic pattern of ground-glass attenuation typically seen with small airways disease such as asthma or  constrictive bronchiolitis. 3. Progressive lower lobe predominant peribronchial thickening and patchy airspace opacity suggesting bronchitis or early bronchopneumonia. 4. Stable borderline enlarged mediastinal and hilar lymph nodes, possibly reactive given the underlying lung findings. 5. Stable moderate-sized hiatal hernia. 6. Stable advanced vascular disease. 7. Aortic atherosclerosis. Aortic Atherosclerosis (ICD10-I70.0). Electronically Signed   By: MYRTIS Stammer M.D.   On: 05/07/2024 14:01   DG Chest 2 View Result Date: 05/07/2024 EXAM: 2 VIEW(S) XRAY OF THE CHEST 05/07/2024 10:16:00 AM COMPARISON: 01/11/2024 CLINICAL HISTORY: Pt comes with SOB and chest tightness. Pt states this started yesterday. Pt states it has gotten worse. Pt states abdomen and goes up to chest. Pt denies any N/V. Pt states hx of pancreatitis. FINDINGS: LUNGS AND PLEURA: Bibasilar airspace opacities, which may reflect atelectasis or airspace disease. . Interstitial prominence compatible with mild edema . Small bilateral pleural effusions. HEART AND MEDIASTINUM: Cardiac enlargement. No acute abnormality of the cardiac and mediastinal silhouettes. Atherosclerotic plaque noted. BONES AND SOFT TISSUES: Thoracic spondylosis. Chronic multilevel compression deformities within the thoracic spine. No acute osseous abnormality. IMPRESSION: 1. Bibasilar airspace cardiac enlargement and mild congestive heart failure. 2. Bibasilar opacities compatible with atelectasis or airspace disease. 3. Small bilateral pleural effusions. Electronically signed by: Waddell Calk MD 05/07/2024 10:47 AM EDT RP Workstation: [id number]    Labs: Basic Metabolic Panel: Recent Labs  Lab 05/07/24 1006 05/07/24 1106 05/08/24 0436  NA 140  --  140  K 4.2  --  3.7  CL 108  --  99  CO2 22  --  27  GLUCOSE 110*  --  100*  BUN 31*  --  31*  CREATININE 1.23*  --  1.56*  CALCIUM  9.5  --  9.8  MG  --  2.0 2.0   CBC: Recent Labs  Lab 05/07/24 1006  05/08/24 0436  WBC 7.5 7.6  HGB 9.5* 9.9*  HCT 31.1* 31.9*  MCV 93.4 91.4  PLT 473* 480*   Microbiology: Results for orders placed or performed during the hospital encounter of 05/07/24  Resp panel by RT-PCR (RSV, Flu A&B, Covid) Anterior Nasal Swab     Status: None   Collection Time: 05/07/24  6:39 PM   Specimen: Anterior Nasal Swab  Result Value Ref Range Status   SARS Coronavirus 2 by RT PCR NEGATIVE NEGATIVE Final    Comment: (NOTE) SARS-CoV-2 target nucleic acids are NOT DETECTED.  The SARS-CoV-2 RNA is generally detectable in upper respiratory specimens during the acute phase of infection. The lowest concentration of SARS-CoV-2 viral copies this assay can detect is 138 copies/mL. A negative result does not preclude SARS-Cov-2 infection and should not be used as the sole basis for treatment or other patient management decisions. A negative result may occur with  improper specimen collection/handling, submission of specimen other than nasopharyngeal swab, presence of viral mutation(s) within the areas targeted by this assay, and inadequate number of viral copies(<138 copies/mL). A negative result must be combined with clinical observations, patient history, and epidemiological information. The expected result is Negative.  Fact Sheet for Patients:  BloggerCourse.com  Fact Sheet for Healthcare Providers:  SeriousBroker.it  This test is no t  yet approved or cleared by the [place]  FDA and  has been authorized for detection and/or diagnosis of SARS-CoV-2 by FDA under an Emergency Use Authorization (EUA). This EUA will remain  in effect (meaning this test can be used) for the duration of the COVID-19 declaration under Section 564(b)(1) of the Act, 21 U.S.C.section 360bbb-3(b)(1), unless the authorization is terminated  or revoked sooner.       Influenza A by PCR NEGATIVE NEGATIVE Final   Influenza B by PCR NEGATIVE  NEGATIVE Final    Comment: (NOTE) The Xpert Xpress SARS-CoV-2/FLU/RSV plus assay is intended as an aid in the diagnosis of influenza from Nasopharyngeal swab specimens and should not be used as a sole basis for treatment. Nasal washings and aspirates are unacceptable for Xpert Xpress SARS-CoV-2/FLU/RSV testing.  Fact Sheet for Patients: BloggerCourse.com  Fact Sheet for Healthcare Providers: SeriousBroker.it  This test is not yet approved or cleared by the [place]  FDA and has been authorized for detection and/or diagnosis of SARS-CoV-2 by FDA under an Emergency Use Authorization (EUA). This EUA will remain in effect (meaning this test can be used) for the duration of the COVID-19 declaration under Section 564(b)(1) of the Act, 21 U.S.C. section 360bbb-3(b)(1), unless the authorization is terminated or revoked.     Resp Syncytial Virus by PCR NEGATIVE NEGATIVE Final    Comment: (NOTE) Fact Sheet for Patients: BloggerCourse.com  Fact Sheet for Healthcare Providers: SeriousBroker.it  This test is not yet approved or cleared by the [place]  FDA and has been authorized for detection and/or diagnosis of SARS-CoV-2 by FDA under an Emergency Use Authorization (EUA). This EUA will remain in effect (meaning this test can be used) for the duration of the COVID-19 declaration under Section 564(b)(1) of the Act, 21 U.S.C. section 360bbb-3(b)(1), unless the authorization is terminated or revoked.  Performed at [facility], [address], [place], [place] [address]     Time coordinating discharge: Over 30 minutes  Marien LITTIE Piety, MD  [facility] 05/08/2024, 11:33 AM

## 2024-05-09 ENCOUNTER — Telehealth: Payer: Self-pay

## 2024-05-09 NOTE — Transitions of Care (Post Inpatient/ED Visit) (Signed)
 05/09/2024  Name: Brittney [name] MRN: [id number] DOB: 1930-03-21  Today's TOC FU Call Status: Today's TOC FU Call Status:: Successful TOC FU Call Completed TOC FU Call Complete Date: 05/09/24 Patient's Name and Date of Birth confirmed. Patient declined TOC program - unable to complete all assessments - patient was scheduled for PCP hospital follow up during call  Transition Care Management Follow-up Telephone Call How have you been since you were released from the hospital?: Better (patient reports feeling better but dizzy  and wasn't dizzy in the hospital) Any questions or concerns?: Yes Patient Questions/Concerns:: patient reports dizziness and has requested help from staff at [facility] (waiting for someone to come an talk with her)  Items Reviewed: Did you receive and understand the discharge instructions provided?: Yes Medications obtained,verified, and reconciled?: Yes (Medications Reviewed) Any new allergies since your discharge?: No Dietary orders reviewed?: NA Do you have support at home?: Yes People in Home [RPT]: alone Name of Support/Comfort Primary Source: lives alone in a house at [facility]  Medications Reviewed Today: Medications Reviewed Today     Reviewed by Lauro Shona LABOR, RN (Registered Nurse) on 05/09/24 at 1341  Med List Status: <None>   Medication Order Taking? Sig Documenting Provider Last Dose Status Informant  acetaminophen  (TYLENOL ) 500 MG tablet [id number] Yes Take 1,000 mg by mouth 2 (two) times daily as needed. [provider]  Active Self  allopurinol  (ZYLOPRIM ) 100 MG tablet [id number] Yes Take 0.5 tablets (50 mg total) by mouth daily. [name], NP  Active Self  Cholecalciferol  (VITAMIN D ) 50 MCG (2000 UT) tablet [id number] Yes Take 2,000 Units by mouth daily. [provider]  Active Self  Cyanocobalamin  (B-12) 1000 MCG TABS [id number] Yes TAKE ONE TABLET BY MOUTH EVERY DAY Eubanks, Jessica K, NP  Active  Self  EPINEPHrine  0.3 mg/0.3 mL IJ SOAJ injection [id number] Yes Inject 0.3 mg into the muscle as needed for anaphylaxis. [name], NP  Active Self  fluticasone  (FLONASE ) 50 MCG/ACT nasal spray [id number] Yes Place 2 sprays into both nostrils as needed. [name], NP  Active Self  furosemide  (LASIX ) 20 MG tablet [id number] Yes Take 0.5 tablets (10 mg total) by mouth daily as needed (shortness of breath, or swelling, or daily weight gain greater than 3 pounds). Abigail Bernardino HERO, PA-C  Active Self  metoprolol  succinate (TOPROL -XL) 25 MG 24 hr tablet [id number] Yes Take 0.5 tablets (12.5 mg total) by mouth daily. Abigail Bernardino HERO, PA-C  Active Self  olopatadine  (PATANOL) 0.1 % ophthalmic solution [id number] Yes Place 1 drop into both eyes 2 (two) times daily as needed for allergies. [name], NP  Active Self  Vibegron  (GEMTESA ) 75 MG TABS [id number]  Take 1 tablet (75 mg total) by mouth daily.  Patient not taking: Reported on 05/09/2024   [name], NP  Active Self  XARELTO  15 MG TABS tablet [id number] Yes TAKE ONE TABLET BY MOUTH EVERY DAY WITH SUPPER Abigail Bernardino HERO, PA-C  Active Self            Home Care and Equipment/Supplies: Were Home Health Services Ordered?: NA (Patient states she has Home Care through [facility])  Functional Questionnaire: Do you need assistance with bathing/showering or dressing?: Yes (patient is currently going to have [facility] care help with shower) Do you need assistance with meal preparation?: Yes [facility] helps) Do you need assistance with eating?: No Do you have difficulty maintaining continence: Yes (occasional  bowel incontinence) Do you need assistance with getting out of bed/getting out of a chair/moving?: No Do you have difficulty managing or taking your medications?: No  Follow up appointments reviewed: Specialist Hospital Follow-up appointment confirmed?: Yes Date of Specialist follow-up appointment?:  05/18/24 Follow-Up Specialty Provider:: cardiology, [name], MD and pumonary, Alfred 05/23/24 Do you need transportation to your follow-up appointment?: No Do you understand care options if your condition(s) worsen?: Yes-patient verbalized understanding  SDOH Interventions Today    Flowsheet Row Most Recent Value  SDOH Interventions   Food Insecurity Interventions Intervention Not Indicated  Housing Interventions Intervention Not Indicated  Transportation Interventions Intervention Not Indicated  Utilities Interventions Intervention Not Indicated    Shona Prow RN, CCM [facility]  VBCI-Population Health RN Care Manager [phone]

## 2024-05-09 NOTE — Transitions of Care (Post Inpatient/ED Visit) (Signed)
   05/09/2024  Name: [name] MRN: [id number] DOB: 1930/05/31  Today's TOC FU Call Status: Today's TOC FU Call Status:: Unsuccessful Call (1st Attempt) Unsuccessful Call (1st Attempt) Date: 05/09/24  Attempted to reach the patient regarding the most recent Inpatient/ED visit.  Follow Up Plan: Additional outreach attempts will be made to reach the patient to complete the Transitions of Care (Post Inpatient/ED visit) call.   Shona Prow RN, CCM [place]  VBCI-Population Health RN Care Manager [phone]

## 2024-05-10 ENCOUNTER — Ambulatory Visit: Payer: Self-pay | Admitting: Student in an Organized Health Care Education/Training Program

## 2024-05-16 ENCOUNTER — Encounter: Payer: Self-pay | Admitting: Nurse Practitioner

## 2024-05-16 ENCOUNTER — Non-Acute Institutional Stay: Admitting: Nurse Practitioner

## 2024-05-16 VITALS — BP 136/72 | HR 73 | Temp 98.0°F | Ht 59.0 in | Wt 146.4 lb

## 2024-05-16 DIAGNOSIS — K449 Diaphragmatic hernia without obstruction or gangrene: Secondary | ICD-10-CM | POA: Diagnosis not present

## 2024-05-16 DIAGNOSIS — I4821 Permanent atrial fibrillation: Secondary | ICD-10-CM | POA: Diagnosis not present

## 2024-05-16 DIAGNOSIS — I5032 Chronic diastolic (congestive) heart failure: Secondary | ICD-10-CM

## 2024-05-16 DIAGNOSIS — N1832 Chronic kidney disease, stage 3b: Secondary | ICD-10-CM | POA: Diagnosis not present

## 2024-05-16 DIAGNOSIS — D631 Anemia in chronic kidney disease: Secondary | ICD-10-CM

## 2024-05-16 NOTE — Progress Notes (Signed)
 Careteam: Patient Care Team: [name], NP as PCP - General (Geriatric Medicine) [name], MD as PCP - Cardiology (Cardiology) [name], OD (Optometry) PLACE OF SERVICE:  [employer]   Advanced Directive information    Allergies  Allergen Reactions   Lisinopril Swelling   Mixed Grasses Anaphylaxis   Bee Venom Swelling   Lactose Other (See Comments)    LACTOSE INTOLERANCE   Azithromycin Diarrhea   Baclofen Diarrhea   Iodine     CKD   Morphine  And Codeine Nausea And Vomiting   [place] Clam (M. Mercenaria) Skin Test Diarrhea    Mussels.   Oyster Extract Diarrhea    Mussels.   Sulfa Antibiotics Rash    Chief Complaint  Patient presents with   Hospitalization Follow-up    Hospital Follow up. Was in ER on 9/14 with Breathing problems.      HPI: Patient is a 89 y.o. female seen in today for hospital follow up at [facility].  Discussed the use of AI scribe software for clinical note transcription with the patient, who gave verbal consent to proceed.  History of Present Illness Brittney [name] is a 89 year old female with chronic heart failure and chronic kidney disease who presents for follow-up after an emergency room visit for shortness of breath.  She was admitted to the hospital from September 14 to May 08, 2024, due to shortness of breath, chest tightness, and epigastric discomfort. Imaging studies revealed bilateral opacities, lower lobe peribronchial thickening, bilateral pleural effusions, and ground glass attenuations. She recalls being told that her heart was the problem and that she had a large hiatal hernia During her hospital stay, she received IV antibiotics and Lasix  but was not discharged with these medications.  Currently, she has no shortness of breath and has not taken Lasix  since returning home. She monitors her weight daily and has not experienced significant swelling in her legs. She feels  tired and occasionally dizzy, especially when walking her dog, for which she uses a walker for support.  Her past medical history includes chronic kidney disease, hypertension, anemia, chronic heart failure, pancreatitis, atrial fibrillation, and a large hiatal hernia. She has been anemic since childhood and recalls being advised to drink milk as a child. She also has a history of pancreatitis, which was suspected to be related to Lasix  use, leading her to discontinue it previously and uses it cautiously.  She experiences occasional indigestion but manages it by being careful with her diet and meal timing.   She is in atrial fibrillation. She has a normal ejection fraction of 55-60% with grade two diastolic dysfunction noted on echocardiogram.      Review of Systems:  Review of Systems  Constitutional:  Positive for malaise/fatigue. Negative for chills, fever and weight loss.  HENT:  Negative for tinnitus.   Respiratory:  Negative for cough, sputum production and shortness of breath.   Cardiovascular:  Negative for chest pain, palpitations and leg swelling.  Gastrointestinal:  Negative for abdominal pain, constipation, diarrhea and heartburn.  Genitourinary:  Negative for dysuria, frequency and urgency.  Musculoskeletal:  Negative for back pain, falls, joint pain and myalgias.  Skin: Negative.   Neurological:  Negative for dizziness and headaches.  Psychiatric/Behavioral:  Negative for depression and memory loss. The patient does not have insomnia.     Past Medical History:  Diagnosis Date   Adopted person    Allergy    Anemia  Angina pectoris    Aortic atherosclerosis    Arthritis    Atrial fibrillation (HCC)    a. ) CHA2DS2-VASc = 7 (age x 2, sex, HTN, TIA x2, aortic plaque);  b.) rate/rhythm maintained on oral metoprolol  succinate; chronically anticoagulated using dose reduced apixaban .   Bilateral carotid artery disease    CAD (coronary artery disease)    a.) LHC  02/09/2007: EF 60%, LVEDP 25 mmHg, normal cors; b.) cCTA 11/27/2011: Ca score 129.73 --> 1.17 RCA, 16.36 LM, 112.21 LAD, 0 LCx; c.) MPI 03/13/2014 - normal.   Cataract    CKD (chronic kidney disease), stage IV (HCC)    Diastolic dysfunction 02/09/2008   a.) LHC 02/09/2008: EF 60%, LVEDP 25 mmHg; b.) TTE 08/07/2010: EF 65%, mild LVH, mild MR, G2DD; b.) TTE 08/20/2018: EF 45-50%, inferior HK, mild LAE; c.) TTE 08/11/2021: EF 45-50%. 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   Left carotid bruit    Long term current use of anticoagulant    a.) apixaban    Lumbar stenosis    Lymphocytic colitis    Osteoporosis    TIA (transient ischemic attack) 07/2018   TIA (transient ischemic attack) 08/2021   Vitamin B12 deficiency    Wears hearing BILATERAL aids    Wears partial (bottom) dentures    Past Surgical History:  Procedure Laterality Date   BREAST EXCISIONAL BIOPSY Right 36 years ago   neg   BUNIONECTOMY Bilateral    COLONOSCOPY N/A 07/02/2008   KNEE ARTHROPLASTY Right 03/11/2022   Procedure: COMPUTER ASSISTED TOTAL KNEE ARTHROPLASTY;  Surgeon: Mardee Lynwood SQUIBB, MD;  Location: [facility] ORS;  Service: Orthopedics;  Laterality: Right;   KNEE ARTHROSCOPY Right 2006   LEFT HEART CATH AND CORONARY ANGIOGRAPHY Left 02/09/2007   Procedure: LEFT HEART CATH AND CORONARY ANGIOGRAPHY; Location: [facility]   TONSILLECTOMY AND ADENOIDECTOMY Bilateral    TOTAL ABDOMINAL HYSTERECTOMY W/ BILATERAL SALPINGOOPHORECTOMY N/A    TOTAL KNEE ARTHROPLASTY Left 04/26/2014   TUBAL LIGATION     Social History:   reports that she has never smoked. She has been exposed to tobacco smoke. She has never used smokeless tobacco. She reports that she does not currently use alcohol. She reports that she does not use drugs.  Family History  Adopted: Yes   Problem Relation Age of Onset   Heart disease Sister    Stroke Brother     Medications: Patient's Medications  New Prescriptions   No medications on file  Previous Medications   ACETAMINOPHEN  (TYLENOL ) 500 MG TABLET    Take 1,000 mg by mouth 2 (two) times daily as needed.   ALLOPURINOL  (ZYLOPRIM ) 100 MG TABLET    Take 0.5 tablets (50 mg total) by mouth daily.   CHOLECALCIFEROL  (VITAMIN D ) 50 MCG (2000 UT) TABLET    Take 2,000 Units by mouth daily.   CYANOCOBALAMIN  (B-12) 1000 MCG TABS    TAKE ONE TABLET BY MOUTH EVERY DAY   EPINEPHRINE  0.3 MG/0.3 ML IJ SOAJ INJECTION    Inject 0.3 mg into the muscle as needed for anaphylaxis.   FLUTICASONE  (FLONASE ) 50 MCG/ACT NASAL SPRAY    Place 2 sprays into both nostrils as needed.   FUROSEMIDE  (LASIX ) 20 MG TABLET    Take 0.5 tablets (10 mg total) by mouth daily as needed (shortness  of breath, or swelling, or daily weight gain greater than 3 pounds).   METOPROLOL  SUCCINATE (TOPROL -XL) 25 MG 24 HR TABLET    Take 0.5 tablets (12.5 mg total) by mouth daily.   OLOPATADINE  (PATANOL) 0.1 % OPHTHALMIC SOLUTION    Place 1 drop into both eyes 2 (two) times daily as needed for allergies.   VIBEGRON  (GEMTESA ) 75 MG TABS    Take 1 tablet (75 mg total) by mouth daily.   XARELTO  15 MG TABS TABLET    TAKE ONE TABLET BY MOUTH EVERY DAY WITH SUPPER  Modified Medications   No medications on file  Discontinued Medications   No medications on file    Physical Exam:  Vitals:   05/16/24 0941  BP: 136/72  Pulse: 73  Temp: 98 F (36.7 C)  SpO2: 97%  Weight: 146 lb 6.4 oz (66.4 kg)  Height: 4' 11 (1.499 m)   Body mass index is 29.57 kg/m. Wt Readings from Last 3 Encounters:  05/16/24 146 lb 6.4 oz (66.4 kg)  05/07/24 150 lb 12.8 oz (68.4 kg)  04/20/24 150 lb 12.8 oz (68.4 kg)    Physical Exam Constitutional:      General: She is not in acute distress.    Appearance: She is well-developed. She is not diaphoretic.  HENT:     Head: Normocephalic and  atraumatic.     Mouth/Throat:     Pharynx: No oropharyngeal exudate.  Eyes:     Conjunctiva/sclera: Conjunctivae normal.     Pupils: Pupils are equal, round, and reactive to light.  Cardiovascular:     Rate and Rhythm: Normal rate. Rhythm irregular.     Heart sounds: Normal heart sounds.  Pulmonary:     Effort: Pulmonary effort is normal.     Breath sounds: Normal breath sounds.  Abdominal:     General: Bowel sounds are normal.     Palpations: Abdomen is soft.  Musculoskeletal:     Cervical back: Normal range of motion and neck supple.     Right lower leg: No edema.     Left lower leg: No edema.  Skin:    General: Skin is warm and dry.  Neurological:     Mental Status: She is alert.  Psychiatric:        Mood and Affect: Mood normal.     Labs reviewed: Basic Metabolic Panel: Recent Labs    03/06/24 1151 05/07/24 1006 05/07/24 1106 05/08/24 0436  NA 141 140  --  140  K 5.2 4.2  --  3.7  CL 106 108  --  99  CO2 19* 22  --  27  GLUCOSE 81 110*  --  100*  BUN 32 31*  --  31*  CREATININE 1.60* 1.23*  --  1.56*  CALCIUM  10.0 9.5  --  9.8  MG  --   --  2.0 2.0  TSH  --   --   --  3.433   Liver Function Tests: Recent Labs    01/03/24 0729 05/07/24 1106  AST 18 21  ALT 7 7  ALKPHOS  --  55  BILITOT 0.9 1.4*  PROT 6.2 6.4*  ALBUMIN  --  3.8   Recent Labs    05/07/24 1106  LIPASE 31   No results for input(s): AMMONIA in the last 8760 hours. CBC: Recent Labs    01/03/24 0729 03/06/24 1151 04/10/24 0000 05/07/24 1006 05/08/24 0436  WBC 6.9   < > 6.4 7.5 7.6  NEUTROABS  4,057  --  4,288  --   --   HGB 9.6*   < > 9.5* 9.5* 9.9*  HCT 30.3*   < > 31.0* 31.1* 31.9*  MCV 92.4   < > 91.4 93.4 91.4  PLT 437*   < > 533* 473* 480*   < > = values in this interval not displayed.   Lipid Panel: Recent Labs    05/08/24 0436  CHOL 152  HDL 58  LDLCALC 82  TRIG 60  CHOLHDL 2.6   TSH: Recent Labs    05/08/24 0436  TSH 3.433   A1C: Lab Results   Component Value Date   HGBA1C 5.4 05/08/2024   ECHOCARDIOGRAM COMPLETE Result Date: 05/08/2024    ECHOCARDIOGRAM REPORT   Patient Name:   Brittney [name] Date of Exam: 05/08/2024 Medical Rec #:  [id number]        Height:       59.0 in Accession #:    [id number]       Weight:       150.8 lb Date of Birth:  [birth year]        BSA:          1.636 m Patient Age:    89 years         BP:           164/84 mmHg Patient Gender: F                HR:           90 bpm. Exam Location:  [facility] Procedure: 2D Echo, Cardiac Doppler and Color Doppler (Both Spectral and Color            Flow Doppler were utilized during procedure). Indications:     CHF  History:         Patient has prior history of Echocardiogram examinations, most                  recent 10/02/2022. CHF, CAD; Risk Factors:Hypertension and                  Dyslipidemia. CKD STAGE 3.  Sonographer:     Philomena Daring Referring Phys:  [id number] KATY L FOUST Diagnosing Phys: Deatrice Cage MD IMPRESSIONS  1. Left ventricular ejection fraction, by estimation, is 55 to 60%. The left ventricle has normal function. The left ventricle has no regional wall motion abnormalities. There is mild left ventricular hypertrophy. Left ventricular diastolic parameters are consistent with Grade II diastolic dysfunction (pseudonormalization).  2. Right ventricular systolic function is normal. The right ventricular size is normal. Tricuspid regurgitation signal is inadequate for assessing PA pressure.  3. Left atrial size was moderately dilated.  4. The mitral valve is normal in structure. Moderate mitral valve regurgitation. No evidence of mitral stenosis. Moderate mitral annular calcification.  5. The aortic valve is normal in structure. Aortic valve regurgitation is mild. Aortic valve sclerosis/calcification is present, without any evidence of aortic stenosis.  6. The inferior vena cava is normal in size with <50% respiratory variability, suggesting right atrial pressure of 8 mmHg. FINDINGS   Left Ventricle: Left ventricular ejection fraction, by estimation, is 55 to 60%. The left ventricle has normal function. The left ventricle has no regional wall motion abnormalities. The left ventricular internal cavity size was normal in size. There is  mild left ventricular hypertrophy. Left ventricular diastolic parameters are consistent with Grade II diastolic dysfunction (pseudonormalization). Right Ventricle: The right ventricular size is normal. No  increase in right ventricular wall thickness. Right ventricular systolic function is normal. Tricuspid regurgitation signal is inadequate for assessing PA pressure. The tricuspid regurgitant velocity is 2.10 m/s, and with an assumed right atrial pressure of 8 mmHg, the estimated right ventricular systolic pressure is 25.6 mmHg. Left Atrium: Left atrial size was moderately dilated. Right Atrium: Right atrial size was normal in size. Pericardium: There is no evidence of pericardial effusion. Mitral Valve: The mitral valve is normal in structure. Moderate mitral annular calcification. Moderate mitral valve regurgitation. No evidence of mitral valve stenosis. Tricuspid Valve: The tricuspid valve is normal in structure. Tricuspid valve regurgitation is trivial. No evidence of tricuspid stenosis. Aortic Valve: The aortic valve is normal in structure. Aortic valve regurgitation is mild. Aortic regurgitation PHT measures 556 msec. Aortic valve sclerosis/calcification is present, without any evidence of aortic stenosis. Pulmonic Valve: The pulmonic valve was normal in structure. Pulmonic valve regurgitation is not visualized. No evidence of pulmonic stenosis. Aorta: The aortic root is normal in size and structure. Venous: The inferior vena cava is normal in size with less than 50% respiratory variability, suggesting right atrial pressure of 8 mmHg. IAS/Shunts: No atrial level shunt detected by color flow Doppler.  LEFT VENTRICLE PLAX 2D LVIDd:         4.49 cm   Diastology  LVIDs:         3.18 cm   LV e' medial:    6.38 cm/s LV PW:         1.16 cm   LV E/e' medial:  15.7 LV IVS:        1.31 cm   LV e' lateral:   7.35 cm/s LVOT diam:     1.70 cm   LV E/e' lateral: 13.6 LV SV:         39 LV SV Index:   24 LVOT Area:     2.27 cm  RIGHT VENTRICLE             IVC RV S prime:     12.90 cm/s  IVC diam: 1.90 cm TAPSE (M-mode): 1.7 cm LEFT ATRIUM              Index        RIGHT ATRIUM           Index LA diam:        5.10 cm  3.12 cm/m   RA Area:     15.20 cm LA Vol (A2C):   87.0 ml  53.19 ml/m  RA Volume:   33.00 ml  20.17 ml/m LA Vol (A4C):   103.0 ml 62.97 ml/m LA Biplane Vol: 101.0 ml 61.74 ml/m  AORTIC VALVE LVOT Vmax:   82.80 cm/s LVOT Vmean:  59.600 cm/s LVOT VTI:    0.173 m AI PHT:      556 msec  AORTA Ao Root diam: 2.80 cm Ao Asc diam:  3.00 cm MITRAL VALVE                TRICUSPID VALVE MV Area (PHT): 5.79 cm     TR Peak grad:   17.6 mmHg MV Decel Time: 131 msec     TR Vmax:        210.00 cm/s MV E velocity: 100.00 cm/s MV A velocity: 47.50 cm/s   SHUNTS MV E/A ratio:  2.11         Systemic VTI:  0.17 m  Systemic Diam: 1.70 cm Deatrice Cage MD Electronically signed by Deatrice Cage MD Signature Date/Time: 05/08/2024/1:56:32 PM    Final      Assessment/Plan Assessment and Plan Assessment & Plan Congestive heart failure with recent acute exacerbation Recent CHF exacerbation with bilateral opacities on chest x-ray. CT chest showed lower lobe peribronchial thickening, bilateral pleural effusions, and ground glass attenuations. Ejection fraction 55-60% with normal LV function and grade 2 diastolic dysfunction.  - Follow up with cardiologist. - Monitor morning weight and take diuretic if weight increases or shortness of breath occurs.  Atrial fibrillation Chronic atrial fibrillation could be contributing to fatigue. She is rate controlled on metoprolol  and on xarelto   - Follow up with cardiologist.  Chronic kidney disease with associated  anemia Chronic kidney disease contributing to anemia. However reports anemia present since childhood. Will continue to monitor CKD and hgb  Hiatal hernia Large hiatal hernia possibly contributing to shortness of breath by pushing against lungs. Occasional indigestion managed by dietary adjustments.  - Follow up with pulmonologist on September 30th.    Saif Peter K. Caro BODILY  [facility] [phone]

## 2024-05-19 ENCOUNTER — Ambulatory Visit

## 2024-05-23 ENCOUNTER — Ambulatory Visit: Admitting: Student in an Organized Health Care Education/Training Program

## 2024-05-23 ENCOUNTER — Encounter: Payer: Self-pay | Admitting: Student in an Organized Health Care Education/Training Program

## 2024-05-23 VITALS — BP 156/70 | HR 82 | Temp 97.5°F | Ht 59.0 in | Wt 177.8 lb

## 2024-05-23 DIAGNOSIS — I5032 Chronic diastolic (congestive) heart failure: Secondary | ICD-10-CM

## 2024-05-23 DIAGNOSIS — R0602 Shortness of breath: Secondary | ICD-10-CM

## 2024-05-23 NOTE — Progress Notes (Signed)
 Assessment & Plan:   1. Chronic heart failure with preserved ejection fraction (HCC) (Primary)  Brittney [name] presents for follow-up after recent hospitalization with decompensated heart failure with preserved ejection fraction.  After our initial visit, I ordered a high-resolution chest CT that was overall within normal without any signs of interstitial lung disease or pleural effusions.  Interestingly, the repeat CT scan Brittney [name] had while hospitalized was consistent with decompensated heart failure with bilateral pleural effusions.  BNP was also noted to be elevated while Brittney [name] was hospitalized. TTE while hospitalized showed signs of HFpEF.  Pulmonary function testing obtained prior to the hospitalization was overall within normal and quite reassuring without any signs of obstruction or impaired diffusion.  Swallowing study demonstrates the presence of a large hiatal hernia which could have contributed to her symptoms.  Today, Brittney [name] feels much better and is significantly improved following her hospitalization.  I performed point-of-care ultrasound and evaluated both pleural spaces and only note a small loculated pleural effusion over the left.  It is my impression that the patient's symptoms is driven by heart failure with preserved ejection fraction compounded by moderate mitral regurgitation.  There could also be a component of her hiatal hernia contributing to the shortness of breath.  Brittney [name] has not yet seen cardiology and I recommended that Brittney [name] be evaluated by her cardiologist as well as by the heart failure clinic. We also discussed measures to avoid salt and decrease the rate of fluid buildup.  Finally, we also discussed the hiatal hernia and I would recommend avoidance of general anesthesia at her age especially that her symptoms are improved following diuresis.  - AMB referral to CHF clinic - Salt avoidance  Return if symptoms worsen or fail to improve.  I spent 30 minutes caring for this patient  today, including preparing to see the patient, obtaining a medical history , reviewing a separately obtained history, performing a medically appropriate examination and/or evaluation, counseling and educating the patient/family/caregiver, ordering medications, tests, or procedures, documenting clinical information in the electronic health record, and independently interpreting results (not separately reported/billed) and communicating results to the patient/family/caregiver  Belva November, MD [facility]   End of visit medications:  No orders of the defined types were placed in this encounter.    Current Outpatient Medications:    acetaminophen  (TYLENOL ) 500 MG tablet, Take 1,000 mg by mouth 2 (two) times daily as needed., Disp: , Rfl:    allopurinol  (ZYLOPRIM ) 100 MG tablet, Take 0.5 tablets (50 mg total) by mouth daily., Disp: 15 tablet, Rfl: 6   Cholecalciferol  (VITAMIN D ) 50 MCG (2000 UT) tablet, Take 2,000 Units by mouth daily., Disp: , Rfl:    Cyanocobalamin  (B-12) 1000 MCG TABS, TAKE ONE TABLET BY MOUTH EVERY DAY, Disp: 100 tablet, Rfl: 1   EPINEPHrine  0.3 mg/0.3 mL IJ SOAJ injection, Inject 0.3 mg into the muscle as needed for anaphylaxis., Disp: 1 each, Rfl: 3   fluticasone  (FLONASE ) 50 MCG/ACT nasal spray, Place 2 sprays into both nostrils as needed., Disp: 16 g, Rfl: 1   furosemide  (LASIX ) 20 MG tablet, Take 0.5 tablets (10 mg total) by mouth daily as needed (shortness of breath, or swelling, or daily weight gain greater than 3 pounds)., Disp: 90 tablet, Rfl: 3   metoprolol  succinate (TOPROL -XL) 25 MG 24 hr tablet, Take 0.5 tablets (12.5 mg total) by mouth daily., Disp: 45 tablet, Rfl: 3   olopatadine  (PATANOL) 0.1 % ophthalmic solution, Place 1 drop into both eyes 2 (two)  times daily as needed for allergies., Disp: 5 mL, Rfl: 2   XARELTO  15 MG TABS tablet, TAKE ONE TABLET BY MOUTH EVERY DAY WITH SUPPER, Disp: 90 tablet, Rfl: 3   Subjective:   PATIENT ID:  Brittney [name] GENDER: female DOB: February 11, 1930, MRN: [id number]  Chief Complaint  Patient presents with   Shortness of Breath    No SOB, wheezing or cough.     HPI  Patient is a pleasant 89 year old female presenting to clinic for follow-up.  Initial Visit 03/22/2024:   Patient reports being in her usual state of health up until a couple of months ago. Brittney [name] is generally very active, and able to do all the activities of her daily living without limitation. Brittney [name] lives at [facility], and is very active in her community. Brittney [name] reports exertional dyspnea since May of 2025. There is no associated cough, wheeze, chest tightness, sputum production, hemoptysis, fevers, chills, or night sweats. Brittney [name] has not had any weight changes recently. Brittney [name] feels the cough might be a bit worse at night when laying flat. Brittney [name] has no rashes and no joint stiffness or swelling/erythema.   Patient reports some GI issues in the past, and had been told Brittney [name] has a colitis in the past. Brittney [name] does not carry a diagnosis of Crohn's or Ulcerative Colitis. Brittney [name] does recall being told Brittney [name] had a hiatal hernia in the past. Brittney [name] is otherwise in good health and excellent spirit. Brittney [name] has no mold in her house, does not play any wind instruments, and has not had any occupational exposures. Brittney [name] has not had any pet birds.  Admission 9/14-9/15/2025: Brittney [name] was admitted to the hospital for a couple of days with increased shortness of breath and imaging findings that suggested decompensated heart failure with bilateral pleural effusions on imaging.  BNP was significantly elevated.  Brittney [name] was diuresed and discharged home with improved symptoms.  Return Visit 05/23/2024:  Returns to clinic today and continues to feel well.  Brittney [name] feels that the shortness of breath is significantly improved and Brittney [name] is able to walk her dog multiple times a day.  Brittney [name] is quite active.  Brittney [name] does enjoy eating food that has salt in it and we did discuss salt avoidance today.  Denies cough  and sputum production today.  Brittney [name] report a history of HTN, CKD, HLD, TIA, HFpEF (on as needed low dose diuretic), Mitral Regurgitation, as well as Atrial Fibrillation on Rivaroxaban . On review of the medical record, Brittney [name]'s had visits with cardiology for exertional dyspnea in the past, thou these symptoms previously spontaneously resolved.   Brittney [name] was born in [place], [place]. Brittney [name] was adopted at a young age and grew up in [place]. [name]. 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 Brittney [name] has 4 children who are all healthy. Brittney [name] is a non-smoker. Brittney [name] has one 36 year old dog (Yorkie).  Ancillary information including prior medications, full medical/surgical/family/social histories, and PFTs (when available) are listed below and have been reviewed.    Review of Systems  Constitutional:  Negative for chills, fever and weight loss.  Respiratory:  Negative for cough, hemoptysis, sputum production, shortness of breath and wheezing.   Cardiovascular:  Negative for chest pain.     Objective:   Vitals:   05/23/24 1358  BP: (!) 156/70  Pulse: 82  Temp: (!) 97.5 F (36.4 C)  SpO2: 94%  Weight: 177 lb 12.8 oz (80.6 kg)  Height: 4' 11 (1.499 m)   94% on RA  BMI Readings from Last 3 Encounters:  05/23/24 35.91 kg/m  05/16/24 29.57 kg/m  05/07/24 30.46 kg/m   Wt Readings from Last 3 Encounters:  05/23/24 177 lb 12.8 oz (80.6 kg)  05/16/24 146 lb 6.4 oz (66.4 kg)  05/07/24 150 lb 12.8 oz (68.4 kg)    Physical Exam Constitutional:      Appearance: Normal appearance.  Cardiovascular:     Rate and Rhythm: Normal rate and regular rhythm.     Pulses: Normal pulses.     Heart sounds: Normal heart sounds.  Pulmonary:     Effort: Pulmonary effort is normal. No respiratory distress.     Breath sounds: Normal breath sounds. No wheezing or rales.  Neurological:     General: No focal  deficit present.     Mental Status: Brittney [name] is alert and oriented to person, place, and time. Mental status is at baseline.       Ancillary Information    Past Medical History:  Diagnosis Date   Adopted person    Allergy    Anemia    Angina pectoris    Aortic atherosclerosis    Arthritis    Atrial fibrillation (HCC)    a. ) CHA2DS2-VASc = 7 (age x 2, sex, HTN, TIA x2, aortic plaque);  b.) rate/rhythm maintained on oral metoprolol  succinate; chronically anticoagulated using dose reduced apixaban .   Bilateral carotid artery disease    CAD (coronary artery disease)    a.) LHC 02/09/2007: EF 60%, LVEDP 25 mmHg, normal cors; b.) cCTA 11/27/2011: Ca score 129.73 --> 1.17 RCA, 16.36 LM, 112.21 LAD, 0 LCx; c.) MPI 03/13/2014 - normal.   Cataract    CKD (chronic kidney disease), stage IV (HCC)    Diastolic dysfunction 02/09/2008   a.) LHC 02/09/2008: EF 60%, LVEDP 25 mmHg; b.) TTE 08/07/2010: EF 65%, mild LVH, mild MR, G2DD; b.) TTE 08/20/2018: EF 45-50%, inferior HK, mild LAE; c.) TTE 08/11/2021: EF 45-50%. 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   Left carotid bruit    Long term current use of anticoagulant    a.) apixaban    Lumbar stenosis    Lymphocytic colitis    Osteoporosis    TIA (transient ischemic attack) 07/2018   TIA (transient ischemic attack) 08/2021   Vitamin B12 deficiency    Wears hearing BILATERAL aids    Wears partial (bottom) dentures      Family History  Adopted: Yes  Problem Relation Age of Onset   Heart disease Sister    Stroke Brother      Past Surgical History:  Procedure Laterality Date   BREAST EXCISIONAL BIOPSY Right 36 years ago   neg   BUNIONECTOMY Bilateral    COLONOSCOPY N/A 07/02/2008   KNEE ARTHROPLASTY Right 03/11/2022   Procedure: COMPUTER ASSISTED TOTAL KNEE  ARTHROPLASTY;  Surgeon: Mardee Lynwood SQUIBB, MD;  Location: [facility] ORS;  Service: Orthopedics;  Laterality: Right;   KNEE ARTHROSCOPY Right 2006   LEFT HEART CATH AND CORONARY ANGIOGRAPHY Left 02/09/2007   Procedure: LEFT HEART CATH AND CORONARY ANGIOGRAPHY; Location: [facility]   TONSILLECTOMY AND ADENOIDECTOMY Bilateral    TOTAL ABDOMINAL HYSTERECTOMY W/ BILATERAL SALPINGOOPHORECTOMY N/A  TOTAL KNEE ARTHROPLASTY Left 04/26/2014   TUBAL LIGATION      Social History   Socioeconomic History   Marital status: Widowed    Spouse name: Not on file   Number of children: Not on file   Years of education: Not on file   Highest education level: Not on file  Occupational History   Not on file  Tobacco Use   Smoking status: Never    Passive exposure: Past (80yrs of exporsure from parents growing up)   Smokeless tobacco: Never  Vaping Use   Vaping status: Never Used  Substance and Sexual Activity   Alcohol use: Not Currently   Drug use: Never   Sexual activity: Not Currently  Other Topics Concern   Not on file  Social History Narrative   Not on file   Social Drivers of Health   Financial Resource Strain: Low Risk  (03/14/2024)   Received from [facility]   Overall Financial Resource Strain (CARDIA)    Difficulty of Paying Living Expenses: Not hard at all  Food Insecurity: No Food Insecurity (05/09/2024)   Hunger Vital Sign    Worried About Running Out of Food in the Last Year: Never true    Ran Out of Food in the Last Year: Never true  Transportation Needs: No Transportation Needs (05/09/2024)   PRAPARE - Administrator, Civil Service (Medical): No    Lack of Transportation (Non-Medical): No  Physical Activity: Not on file  Stress: Not on file  Social Connections: Not on file  Intimate Partner Violence: Not At Risk (05/09/2024)   Humiliation, Afraid, Rape, and Kick questionnaire    Fear of Current or Ex-Partner: No    Emotionally Abused: No     Physically Abused: No    Sexually Abused: No     Allergies  Allergen Reactions   Lisinopril Swelling   Mixed Grasses Anaphylaxis   Bee Venom Swelling   Lactose Other (See Comments)    LACTOSE INTOLERANCE   Azithromycin Diarrhea   Baclofen Diarrhea   Iodine     CKD   Morphine  And Codeine Nausea And Vomiting   [place] Clam (M. Mercenaria) Skin Test Diarrhea    Mussels.   Oyster Extract Diarrhea    Mussels.   Sulfa Antibiotics Rash     CBC    Component Value Date/Time   WBC 7.6 05/08/2024 0436   RBC 3.49 (L) 05/08/2024 0436   HGB 9.9 (L) 05/08/2024 0436   HGB 10.0 (L) 03/06/2024 1151   HCT 31.9 (L) 05/08/2024 0436   HCT 32.4 (L) 03/06/2024 1151   PLT 480 (H) 05/08/2024 0436   PLT 485 (H) 03/06/2024 1151   MCV 91.4 05/08/2024 0436   MCV 92 03/06/2024 1151   MCH 28.4 05/08/2024 0436   MCHC 31.0 05/08/2024 0436   RDW 15.7 (H) 05/08/2024 0436   RDW 14.4 03/06/2024 1151   LYMPHSABS 1,487 04/12/2023 0735   MONOABS 0.6 08/19/2018 1057   EOSABS 13 (L) 04/10/2024 0000   BASOSABS 109 04/10/2024 0000    Pulmonary Functions Testing Results:    Latest Ref Rng & Units 04/04/2024    1:37 PM  PFT Results  FVC-Pre L 1.50   FVC-Predicted Pre % 102   FVC-Post L 1.54   FVC-Predicted Post % 105   Pre FEV1/FVC % % 81   Post FEV1/FCV % % 86   FEV1-Pre L 1.21   FEV1-Predicted Pre % 116   FEV1-Post L 1.33  DLCO uncorrected ml/min/mmHg 8.72   TLC L 3.45   TLC % Predicted % 80   RV % Predicted % 67     Outpatient Medications Prior to Visit  Medication Sig Dispense Refill   acetaminophen  (TYLENOL ) 500 MG tablet Take 1,000 mg by mouth 2 (two) times daily as needed.     allopurinol  (ZYLOPRIM ) 100 MG tablet Take 0.5 tablets (50 mg total) by mouth daily. 15 tablet 6   Cholecalciferol  (VITAMIN D ) 50 MCG (2000 UT) tablet Take 2,000 Units by mouth daily.     Cyanocobalamin  (B-12) 1000 MCG TABS TAKE ONE TABLET BY MOUTH EVERY DAY 100 tablet 1   EPINEPHrine  0.3 mg/0.3 mL IJ  SOAJ injection Inject 0.3 mg into the muscle as needed for anaphylaxis. 1 each 3   fluticasone  (FLONASE ) 50 MCG/ACT nasal spray Place 2 sprays into both nostrils as needed. 16 g 1   furosemide  (LASIX ) 20 MG tablet Take 0.5 tablets (10 mg total) by mouth daily as needed (shortness of breath, or swelling, or daily weight gain greater than 3 pounds). 90 tablet 3   metoprolol  succinate (TOPROL -XL) 25 MG 24 hr tablet Take 0.5 tablets (12.5 mg total) by mouth daily. 45 tablet 3   olopatadine  (PATANOL) 0.1 % ophthalmic solution Place 1 drop into both eyes 2 (two) times daily as needed for allergies. 5 mL 2   XARELTO  15 MG TABS tablet TAKE ONE TABLET BY MOUTH EVERY DAY WITH SUPPER 90 tablet 3   No facility-administered medications prior to visit.

## 2024-05-24 NOTE — Progress Notes (Unsigned)
 Cardiology Office Note    Date:  05/26/2024   ID:  Brittney [name], Brittney [name] [birth year], MRN [id number]  PCP:  [name], NP  Cardiologist:  Redell Cave, MD  Electrophysiologist:  None   Chief Complaint: Follow-up  History of Present Illness:   Brittney [name] is a 89 y.o. female with history of permanent A-fib, HFpEF, TIA, CKD stage IIIb with anemia of chronic disease, HTN, HLD, and lumbar radiculitis who presents for follow-up of HFpEF and A-fib.   She was previously followed by [facility], and underwent cardiac cath in 2008 which showed no angiographically apparent CAD in the major epicardial arteries.  Calcium  score in 2013 of 129.73.  Nuclear stress test in 2015 showed no evidence of ischemia with normal LV function.  She was admitted to the hospital in 2019 with a suspected TIA.  Carotid artery ultrasound at that time showed no hemodynamically significant stenosis in the bilateral carotid arteries with antegrade flow of the bilateral vertebral arteries.  Echo demonstrated an EF of 45 to 50% with hypokinesis of the inferior myocardium and a mildly dilated left atrium.  Notes indicate she was diagnosed with A-fib in 10/2020, and initiated on apixaban .  She established care with our group in 2022.  Subsequent echo in 07/2021 showed a stable LV systolic function with an EF of 45 to 50%, no regional wall motion abnormalities, indeterminate LV diastolic function parameters, normal RV systolic function and ventricular cavity size, severely dilated left atrium, mildly dilated right atrium, mild to moderate mitral regurgitation, mild to moderate tricuspid regurgitation, and an estimated right atrial pressure of 8 mmHg.  She was seen in our office in 08/2021 and reported a possible TIA several weeks prior.  She was initiated on aspirin  with continuation of apixaban  and referred to neurology with recommendation to be maintained on apixaban  and aspirin .  MRI of the brain showed no evidence  of acute intracranial abnormality with mild to moderate chronic small vessel ischemic changes that had progressed from study in 2019 as well as mild generalized cerebral atrophy.  She was seen in the office in 08/2022 noting a several month history of exertional shortness of breath when walking up an incline without frank chest pain.  Symptoms were largely unchanged when compared to her prior visit and would last for a couple of seconds with spontaneous resolution.  She also wondered if rosuvastatin  was contributing to some of her lower extremity discomfort.  In this setting, she underwent echo in 09/2022 which showed an EF of 60 to 65%, no regional wall motion abnormalities, mild LVH, grade 2 diastolic dysfunction, normal RV systolic function and ventricular cavity size, mildly elevated RVSP at 38.8 mmHg, moderately dilated left atrium, mild to moderate mitral regurgitation, mild to moderate tricuspid regurgitation, mild aortic insufficiency, and an estimated right atrial pressure of 3 mmHg.   She was evaluated at her PCP's office on 04/08/2023 noting an increase in fatigue and dyspnea with associated chest discomfort.  Reported ankle swelling at the end of the day.  Chest x-ray was obtained which demonstrated vascular congestion.  In this setting, primary cardiologist initiated the patient on furosemide  20 mg daily.  However, patient subsequently notified our office that she was experiencing dizziness with initiation of furosemide .  With this, furosemide  was held on 04/28/2023.   She was seen in the office on 04/29/2023 noting an increase in dizziness following initiation of furosemide .  She felt like her breathing was overall stable with continued shortness of breath and chest  heaviness with ambulation that was longstanding.  There was no significant lower extremity swelling.  Toprol  was reduced to 12.5 mg daily with recommendation to continue to hold furosemide .  To further evaluate her longstanding exertional  dyspnea, she underwent Lexiscan  MPI on 05/07/2023 that showed no evidence of ischemia or infarction with normal LV systolic function and was overall low risk.  CT attenuated corrected images demonstrated coronary artery calcification and aortic atherosclerosis.   She was seen in the office on 06/30/2023 noting unchanged chronic dyspnea.  Orthostatic dizziness improved following reduction of Toprol -XL and discontinuation of scheduled furosemide .   She was seen in the ED on 09/03/2023 after sustaining a mechanical fall, attempting to sit down at her kitchen [name] when the chair slid out from underneath her.  CT head showed no acute intracranial process.  CT cervical spine showed no acute fracture or traumatic finding.   She was seen in the office in 08/2023 and felt like her chronic dyspnea was a little improved.  She was taking furosemide  10 mg once daily dating back to November 2024.  Off rosuvastatin , she noted some improvement in myalgias, though not resolution.  Chest x-ray in 12/2023 showed suspected atelectasis and possible minimal pleural effusions.  CBC obtained at that time showed a downtrending hemoglobin of 9.6.  She was last seen in our office in 02/2024 reporting stable chronic dyspnea.  She self discontinued furosemide  in 12/2023, reporting she felt like this was contributing to abdominal discomfort.  She was evaluated by pulmonology for chronic dyspnea on 02/2024 with high-resolution chest CT in 03/2024 showing small airway disease, small bilateral pleural effusions, possibly loculated, moderate paraesophageal hernia with intrathoracic position of the gastric body and fundus, compressive atelectasis of the left lung bases secondary to hernia sac.  She was admitted to the hospital in 04/2024 with chest and back pain with deep inspiration and exertional dyspnea.  BNP 712.  High-sensitivity troponin negative.  Hemoglobin 9.5.  She declined CTA chest.  Noncontrast chest CT showed lower lobe peribronchial  thickening, bilateral pleural effusions, and ground glass attenuation.  Echo during the admission showed an EF of 55 to 60%, no regional wall motion abnormalities, mild LVH, grade 2 diastolic dysfunction, normal RV systolic function and ventricular cavity size, moderately dilated left atrium, moderate mitral regurgitation with moderate mitral annular calcification, mild aortic insufficiency, and aortic valve sclerosis without evidence of stenosis.  She followed up with pulmonology in late 04/2024 feeling much better following hospitalization.  Point-of-care ultrasound showed only a small loculated pleural effusion on the left side.  It was felt the patient's symptoms were largely being driven by HFpEF compounded by moderate mitral regurgitation and in the setting of her hiatal hernia.  She comes in accompanied by a friend today and is doing very well from a cardiac perspective.  She notes her breathing is much improved following her recent hospitalization.  She has not needed any as needed furosemide .  No lower extremity swelling or progressive orthopnea.  No early satiety.  No falls, hematochezia, or melena.  Her weight is down 3 pounds today when compared to her visit in 02/2024.  She has follow-up with the CHF clinic next week.   Labs independently reviewed: 05/2024 - Hgb 9.2, PLT 481, BUN 29, serum creatinine 1.34, potassium 5.4 04/2024 - A1c 5.4, TSH normal, TC 152, TG 60, HDL 58, LDL 82, magnesium  2.0, potassium 3.7, BUN 31, serum creatinine 1.56, Hgb 9.9, PLT 480, albumin 3.8, AST/ALT normal  Past Medical History:  Diagnosis  Date   Adopted person    Allergy    Anemia    Angina pectoris    Aortic atherosclerosis    Arthritis    Atrial fibrillation (HCC)    a. ) CHA2DS2-VASc = 7 (age x 2, sex, HTN, TIA x2, aortic plaque);  b.) rate/rhythm maintained on oral metoprolol  succinate; chronically anticoagulated using dose reduced apixaban .   Bilateral carotid artery disease    CAD (coronary artery  disease)    a.) LHC 02/09/2007: EF 60%, LVEDP 25 mmHg, normal cors; b.) cCTA 11/27/2011: Ca score 129.73 --> 1.17 RCA, 16.36 LM, 112.21 LAD, 0 LCx; c.) MPI 03/13/2014 - normal.   Cataract    CKD (chronic kidney disease), stage IV (HCC)    Diastolic dysfunction 02/09/2008   a.) LHC 02/09/2008: EF 60%, LVEDP 25 mmHg; b.) TTE 08/07/2010: EF 65%, mild LVH, mild MR, G2DD; b.) TTE 08/20/2018: EF 45-50%, inferior HK, mild LAE; c.) TTE 08/11/2021: EF 45-50%. 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   Left carotid bruit    Long term current use of anticoagulant    a.) apixaban    Lumbar stenosis    Lymphocytic colitis    Osteoporosis    TIA (transient ischemic attack) 07/2018   TIA (transient ischemic attack) 08/2021   Vitamin B12 deficiency    Wears hearing BILATERAL aids    Wears partial (bottom) dentures     Past Surgical History:  Procedure Laterality Date   BREAST EXCISIONAL BIOPSY Right 36 years ago   neg   BUNIONECTOMY Bilateral    COLONOSCOPY N/A 07/02/2008   KNEE ARTHROPLASTY Right 03/11/2022   Procedure: COMPUTER ASSISTED TOTAL KNEE ARTHROPLASTY;  Surgeon: Mardee Lynwood SQUIBB, MD;  Location: [facility] ORS;  Service: Orthopedics;  Laterality: Right;   KNEE ARTHROSCOPY Right 2006   LEFT HEART CATH AND CORONARY ANGIOGRAPHY Left 02/09/2007   Procedure: LEFT HEART CATH AND CORONARY ANGIOGRAPHY; Location: [facility]   TONSILLECTOMY AND ADENOIDECTOMY Bilateral    TOTAL ABDOMINAL HYSTERECTOMY W/ BILATERAL SALPINGOOPHORECTOMY N/A    TOTAL KNEE ARTHROPLASTY Left 04/26/2014   TUBAL LIGATION      Current Medications: Current Meds  Medication Sig   acetaminophen  (TYLENOL ) 500 MG tablet Take 1,000 mg by mouth 2 (two) times daily as needed.   allopurinol  (ZYLOPRIM ) 100 MG tablet Take 0.5 tablets (50 mg total) by mouth daily.    Cholecalciferol  (VITAMIN D ) 50 MCG (2000 UT) tablet Take 2,000 Units by mouth daily.   Cyanocobalamin  (B-12) 1000 MCG TABS TAKE ONE TABLET BY MOUTH EVERY DAY   EPINEPHrine  0.3 mg/0.3 mL IJ SOAJ injection Inject 0.3 mg into the muscle as needed for anaphylaxis.   fluticasone  (FLONASE ) 50 MCG/ACT nasal spray Place 2 sprays into both nostrils as needed.   furosemide  (LASIX ) 20 MG tablet Take 0.5 tablets (10 mg total) by mouth daily as needed (shortness of breath, or swelling, or daily weight gain greater than 3 pounds).   losartan (COZAAR) 25 MG tablet Take 25 mg by mouth daily.   metoprolol  succinate (TOPROL -XL) 25 MG 24 hr tablet Take 0.5 tablets (12.5 mg total) by mouth daily.   olopatadine  (PATANOL) 0.1 % ophthalmic solution Place 1 drop into both eyes 2 (two) times daily as needed for allergies.   XARELTO  15 MG TABS tablet TAKE ONE TABLET BY  MOUTH EVERY DAY WITH SUPPER    Allergies:   Lisinopril, Mixed grasses, Bee venom, Lactose, Azithromycin, Baclofen, Iodine, Morphine  and codeine, Northern quahog clam (m. mercenaria) skin test, Oyster extract, and Sulfa antibiotics   Social History   Socioeconomic History   Marital status: Widowed    Spouse name: Not on file   Number of children: Not on file   Years of education: Not on file   Highest education level: Not on file  Occupational History   Not on file  Tobacco Use   Smoking status: Never    Passive exposure: Past (26yrs of exporsure from parents growing up)   Smokeless tobacco: Never  Vaping Use   Vaping status: Never Used  Substance and Sexual Activity   Alcohol use: Not Currently   Drug use: Never   Sexual activity: Not Currently  Other Topics Concern   Not on file  Social History Narrative   Not on file   Social Drivers of Health   Financial Resource Strain: Low Risk  (03/14/2024)   Received from [facility]   Overall Financial Resource Strain (CARDIA)    Difficulty of Paying Living Expenses: Not  hard at all  Food Insecurity: No Food Insecurity (05/09/2024)   Hunger Vital Sign    Worried About Running Out of Food in the Last Year: Never true    Ran Out of Food in the Last Year: Never true  Transportation Needs: No Transportation Needs (05/09/2024)   PRAPARE - Administrator, Civil Service (Medical): No    Lack of Transportation (Non-Medical): No  Physical Activity: Not on file  Stress: Not on file  Social Connections: Not on file     Family History:  The patient's family history includes Heart disease in her sister; Stroke in her brother. She was adopted.  ROS:   12-point review of systems is negative unless otherwise noted in the HPI.   EKGs/Labs/Other Studies Reviewed:    Studies reviewed were summarized above. The additional studies were reviewed today:  2D echo 05/08/2024: 1. Left ventricular ejection fraction, by estimation, is 55 to 60%. The  left ventricle has normal function. The left ventricle has no regional  wall motion abnormalities. There is mild left ventricular hypertrophy.  Left ventricular diastolic parameters  are consistent with Grade II diastolic dysfunction (pseudonormalization).   2. Right ventricular systolic function is normal. The right ventricular  size is normal. Tricuspid regurgitation signal is inadequate for assessing  PA pressure.   3. Left atrial size was moderately dilated.   4. The mitral valve is normal in structure. Moderate mitral valve  regurgitation. No evidence of mitral stenosis. Moderate mitral annular  calcification.   5. The aortic valve is normal in structure. Aortic valve regurgitation is  mild. Aortic valve sclerosis/calcification is present, without any  evidence of aortic stenosis.   6. The inferior vena cava is normal in size with <50% respiratory  variability, suggesting right atrial pressure of 8 mmHg.  __________  Lexiscan  MPI 05/07/2023:   The study is normal. The study is low risk.   No ST deviation  was noted.   LV perfusion is normal. There is no evidence of ischemia. There is no evidence of infarction.   Left ventricular function is normal. End diastolic cavity size is normal. End systolic cavity size is normal.   CT attenuation images show evidence of aortic and coronary calcifications. __________   2D echo 10/02/2022: 1. Left ventricular ejection fraction, by estimation,  is 60 to 65%. The  left ventricle has normal function. The left ventricle has no regional  wall motion abnormalities. There is mild left ventricular hypertrophy.  Left ventricular diastolic parameters  are consistent with Grade II diastolic dysfunction (pseudonormalization).  The average left ventricular global longitudinal strain is -8.0 %. The  global longitudinal strain is abnormal.   2. Right ventricular systolic function is normal. The right ventricular  size is normal. There is mildly elevated pulmonary artery systolic  pressure. The estimated right ventricular systolic pressure is 38.8 mmHg.   3. Left atrial size was moderately dilated.   4. The mitral valve is normal in structure. Mild to moderate mitral valve  regurgitation. No evidence of mitral stenosis.   5. Tricuspid valve regurgitation is mild to moderate.   6. The aortic valve is tricuspid. Aortic valve regurgitation is mild. No  aortic stenosis is present.   7. The inferior vena cava is normal in size with greater than 50%  respiratory variability, suggesting right atrial pressure of 3 mmHg.  __________   2D echo 08/11/2021: 1. Left ventricular ejection fraction, by estimation, is 45 to 50%. The  left ventricle has mildly decreased function. The left ventricle has no  regional wall motion abnormalities. Left ventricular diastolic parameters  are indeterminate.   2. Right ventricular systolic function is normal. The right ventricular  size is normal.   3. Left atrial size was severely dilated.   4. Right atrial size was mildly dilated.   5.  The mitral valve is normal in structure. Mild to moderate mitral valve  regurgitation.   6. Tricuspid valve regurgitation is mild to moderate.   7. The aortic valve is tricuspid. Aortic valve regurgitation is not  visualized.   8. The inferior vena cava is normal in size with <50% respiratory  variability, suggesting right atrial pressure of 8 mmHg.  __________   2D echo 08/20/2018: - Left ventricle: The cavity size was mildly dilated. Wall    thickness was normal. Systolic function was mildly reduced. The    estimated ejection fraction was in the range of 45% to 50%.    Hypokinesis of the inferior myocardium.  - Left atrium: The atrium was mildly dilated.   Impressions:   - Mildly reduced LVF    Inferior Hypokinesis    EF=45-50%    Normal Right side. No cardiac source of emboli was indentified.  __________   Carotid artery ultrasound 08/19/2018: IMPRESSION: No hemodynamically significant stenosis is noted in either cervical carotid artery.   EKG:  EKG is ordered today.  The EKG ordered today demonstrates A-fib, 82 bpm, left axis deviation, incomplete RBBB, consistent with prior tracing  Recent Labs: 05/07/2024: ALT 7; B Natriuretic Peptide 712.1 05/08/2024: BUN 31; Creatinine, Ser 1.56; Hemoglobin 9.9; Magnesium  2.0; Platelets 480; Potassium 3.7; Sodium 140; TSH 3.433  Recent Lipid Panel    Component Value Date/Time   CHOL 152 05/08/2024 0436   TRIG 60 05/08/2024 0436   HDL 58 05/08/2024 0436   CHOLHDL 2.6 05/08/2024 0436   VLDL 12 05/08/2024 0436   LDLCALC 82 05/08/2024 0436   LDLCALC 90 04/12/2023 0735   LDLDIRECT 58 09/04/2022 1129    PHYSICAL EXAM:    VS:  BP (!) 140/65 (BP Location: Left Arm, Patient Position: Sitting, Cuff Size: Normal)   Pulse 82   Ht 4' 11 (1.499 m)   Wt 147 lb 9.6 oz (67 kg)   SpO2 98%   BMI 29.81 kg/m   BMI: Body  mass index is 29.81 kg/m.  Physical Exam Vitals reviewed.  Constitutional:      Appearance: She is well-developed.   HENT:     Head: Normocephalic and atraumatic.  Eyes:     General:        Right eye: No discharge.        Left eye: No discharge.  Cardiovascular:     Rate and Rhythm: Normal rate. Rhythm irregularly irregular.     Pulses:          Posterior tibial pulses are 2+ on the right side and 2+ on the left side.     Heart sounds: S1 normal and S2 normal. Heart sounds not distant. No midsystolic click and no opening snap. Murmur heard.     Systolic murmur is present with a grade of 1/6 at the upper left sternal border.     No friction rub.  Pulmonary:     Effort: Pulmonary effort is normal. No respiratory distress.     Breath sounds: Normal breath sounds. No decreased breath sounds, wheezing, rhonchi or rales.  Musculoskeletal:     Cervical back: Normal range of motion.     Right lower leg: No edema.     Left lower leg: No edema.  Skin:    General: Skin is warm and dry.     Nails: There is no clubbing.  Neurological:     Mental Status: She is alert and oriented to person, place, and time.  Psychiatric:        Speech: Speech normal.        Behavior: Behavior normal.        Thought Content: Thought content normal.        Judgment: Judgment normal.     Wt Readings from Last 3 Encounters:  05/26/24 147 lb 9.6 oz (67 kg)  05/23/24 177 lb 12.8 oz (80.6 kg)  05/16/24 146 lb 6.4 oz (66.4 kg)     ASSESSMENT & PLAN:   Permanent Afib: Well-controlled ventricular rates on Toprol -XL 12.5 mg.  CHA2DS2-VASc at least 7 (HTN, age x 2, TIA x 2, vascular disease, sex category).  She remains on rivaroxaban  15 mg daily with a creatinine clearance of 28.  Anemia of chronic disease is largely stable.  No falls or symptoms concerning for bleeding.  HFpEF with chronic dyspnea: Dyspnea much improved following IV diuresis.  Suspect her dyspnea is multifactorial including HFpEF and moderate mitral regurgitation which are exacerbated by anemia of chronic disease, as well as known moderate hiatal hernia.   Maintain euvolemic state.  Continue as needed furosemide  10 mg daily, has not needed any since hospitalization.  Defer addition of MRA with underlying CKD.  Consider addition of SGLT2 inhibitor.  If dyspnea returns, may need to consider RHC to better understand hemodynamic status and help guide pharmacotherapy.  HTN: Blood pressure is mildly elevated in the office today.  Remains on Toprol -XL as above.  HLD with statin intolerance: LDL 82 in 04/2024.  No longer on statin therapy secondary to myalgia.  Mitral regurgitation: Moderate by echo in 04/2024.  Monitor periodically.  History of TIA: No new deficits.  Remains on renally dosed rivaroxaban .  CKD stage IIIb with normocytic anemia: Followed by nephrology.  Denies symptoms concerning for bleeding.     Disposition: F/u with Dr. Darliss or an APP in 3 months.   Medication Adjustments/Labs and Tests Ordered: Current medicines are reviewed at length with the patient today.  Concerns regarding medicines are outlined above. Medication changes,  Labs and Tests ordered today are summarized above and listed in the Patient Instructions accessible in Encounters.   Signed, Bernardino Bring, PA-C 05/26/2024 12:29 PM     [facility] [place] [address] [place], [place] [address] [phone]

## 2024-05-26 ENCOUNTER — Ambulatory Visit: Attending: Physician Assistant | Admitting: Physician Assistant

## 2024-05-26 ENCOUNTER — Encounter: Payer: Self-pay | Admitting: Physician Assistant

## 2024-05-26 VITALS — BP 140/65 | HR 82 | Ht 59.0 in | Wt 147.6 lb

## 2024-05-26 DIAGNOSIS — I5189 Other ill-defined heart diseases: Secondary | ICD-10-CM

## 2024-05-26 DIAGNOSIS — E785 Hyperlipidemia, unspecified: Secondary | ICD-10-CM | POA: Insufficient documentation

## 2024-05-26 DIAGNOSIS — I5032 Chronic diastolic (congestive) heart failure: Secondary | ICD-10-CM | POA: Insufficient documentation

## 2024-05-26 DIAGNOSIS — N1832 Chronic kidney disease, stage 3b: Secondary | ICD-10-CM | POA: Insufficient documentation

## 2024-05-26 DIAGNOSIS — I10 Essential (primary) hypertension: Secondary | ICD-10-CM | POA: Diagnosis present

## 2024-05-26 DIAGNOSIS — Z789 Other specified health status: Secondary | ICD-10-CM | POA: Insufficient documentation

## 2024-05-26 DIAGNOSIS — D638 Anemia in other chronic diseases classified elsewhere: Secondary | ICD-10-CM | POA: Diagnosis present

## 2024-05-26 DIAGNOSIS — G459 Transient cerebral ischemic attack, unspecified: Secondary | ICD-10-CM | POA: Insufficient documentation

## 2024-05-26 DIAGNOSIS — I4821 Permanent atrial fibrillation: Secondary | ICD-10-CM | POA: Insufficient documentation

## 2024-05-26 DIAGNOSIS — I340 Nonrheumatic mitral (valve) insufficiency: Secondary | ICD-10-CM | POA: Insufficient documentation

## 2024-05-26 NOTE — Patient Instructions (Signed)
 Medication Instructions:  Your physician recommends that you continue on your current medications as directed. Please refer to the Current Medication list given to you today.    *If you need a refill on your cardiac medications before your next appointment, please call your pharmacy*  Lab Work: None ordered at this time   Follow-Up: At [facility], you and your health needs are our priority.  As part of our continuing mission to provide you with exceptional heart care, our providers are all part of one team.  This team includes your primary Cardiologist (physician) and Advanced Practice Providers or APPs (Physician Assistants and Nurse Practitioners) who all work together to provide you with the care you need, when you need it.  Your next appointment:   3 month(s)  Provider:   You may see Redell Cave, MD or Bernardino Bring, PA-C

## 2024-05-30 ENCOUNTER — Telehealth: Payer: Self-pay | Admitting: Family

## 2024-05-30 NOTE — Telephone Encounter (Signed)
 Called to confirm/remind patient of their appointment at [facility] on 05/31/24.   Appointment:   [x] Confirmed  [] Left mess   [] No answer/No voice mail  [] VM Full/unable to leave message  [] Phone not in service  Patient reminded to bring all medications and/or complete list.  Confirmed patient has transportation. Gave directions, instructed to utilize valet parking.

## 2024-05-30 NOTE — Progress Notes (Unsigned)
 Advanced Heart Failure Clinic Note   Referring Physician: Dr Isadora PCP: [name], NP Cardiologist: Redell Cave, MD / Abigail Motto, [place]  Chief Complaint: shortness of breath   HPI:  Brittney [name] is a 89 y/o female with a history of HFpEF, HTN, CKD, hyperlipidemia, CAD (non-obstructive), permanent AF (03/22), GERD, TIA, anemia of chronic disease.   Previously followed by [facility], and underwent cardiac cath in 2008 which showed no angiographically apparent CAD in the major epicardial arteries. Calcium  score in 2013 of 129.73. Nuclear stress test in 2015 showed no evidence of ischemia with normal LV function.   Admitted to the hospital in 2019 with a suspected TIA. Carotid artery ultrasound at that time showed no hemodynamically significant stenosis in the bilateral carotid arteries with antegrade flow of the bilateral vertebral arteries. Echo demonstrated an EF of 45 to 50% with hypokinesis of the inferior myocardium and a mildly dilated left atrium.   Echo in 07/2021 showed a stable LV systolic function with an EF of 45 to 50%, no regional wall motion abnormalities, indeterminate LV diastolic function parameters, normal RV systolic function and ventricular cavity size, severely dilated left atrium, mildly dilated right atrium, mild to moderate mitral regurgitation, mild to moderate tricuspid regurgitation, and an estimated right atrial pressure of 8 mmHg   Echo in 09/2022 showed an EF of 60 to 65%, no regional wall motion abnormalities, mild LVH, grade 2 diastolic dysfunction, normal RV systolic function and ventricular cavity size, mildly elevated RVSP at 38.8 mmHg, moderately dilated left atrium, mild to moderate mitral regurgitation, mild to moderate tricuspid regurgitation, mild aortic insufficiency, and an estimated right atrial pressure of 3 mmHg.   Admitted 05/07/24 with chest pain and back pain with deep inspiration and dyspnea on exertion. She declined to undergo CTA  PE citing concern with contrast dye and kidney function. Has not been taking lasix  in years due to a bout with pancreatitis. SpO2 94% on room air. CXR obtained and shows small bilateral pleural effusions, bibasilar opacities, and mild CHF. CT chest obtained showed lower lobe peribronchial thickening, bilateral pleural effusions, and ground glass attenuation.  BNP 712.1. IV diuresed. Echo 05/08/24: EF 55-60% with mild LVH, G2DD, normal RV, moderate LAE, moderate MR, mild AR.   She presents today, with a friend, for her initial HF visit with a chief complaint of intermittent shortness of breath. Has associated intermittent dizziness. Difficulty sleeping due to mind racing. She noticed some shortness of breath walking up the sidewalk to the office today although it resolved quickly. She was started on losartan 1 week ago with the plan to begin SGLT2 at next visit. She hasn't had to take lasix  in ~ 1 month.   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 She feels like that is what is causing her to not sleep well because she has a lot on her mind right now.    Review of Systems: [y] = yes, [ ]  = no   General: Weight gain [ ] ; Weight loss [ ] ; Anorexia [ ] ; Fatigue [ ] ; Fever [ ] ; Chills [ ] ; Weakness [ ]   Cardiac: Chest pain/pressure [ ] ; Resting SOB [ ] ; Exertional SOB [ y]; Orthopnea [ ] ; Pedal Edema [ ] ; Palpitations [ ] ; Syncope [ ] ; Presyncope [ ] ; Paroxysmal nocturnal dyspnea[ ]   Pulmonary: Cough [ ] ; Wheezing[ ] ; Hemoptysis[ ] ; Sputum [ ] ; Snoring [ ]   GI: Vomiting[ ] ; Dysphagia[ ] ; Melena[ ] ; Hematochezia [ ] ; Heartburn[ ] ; Abdominal pain [ ] ; Constipation [ ] ; Diarrhea [ ] ; BRBPR [ ]   GU: Hematuria[ ] ; Dysuria [ ] ; Nocturia[ ]   Vascular: Pain in legs with walking [ ] ; Pain in feet with lying flat [ ] ; Non-healing sores [ ] ; Stroke [ ] ; TIA [ ] ; Slurred speech [ ] ;  Neuro: Dizziness[y ]; Vertigo[ ] ; Seizures[ ] ;  Paresthesias[ ] ;Blurred vision [ ] ; Diplopia [ ] ; Vision changes [ ]   Ortho/Skin: Arthritis [ ] ; Joint pain [ ] ; Muscle pain [ ] ; Joint swelling [ ] ; Back Pain [ ] ; Rash [ ]   Psych: Depression[ ] ; Anxiety[ ]   Heme: Bleeding problems [ ] ; Clotting disorders [ ] ; Anemia Brittney [name] ]  Endocrine: Diabetes [ ] ; Thyroid dysfunction[ ]    Past Medical History:  Diagnosis Date   Adopted person    Allergy    Anemia    Angina pectoris    Aortic atherosclerosis    Arthritis    Atrial fibrillation (HCC)    a. ) CHA2DS2-VASc = 7 (age x 2, sex, HTN, TIA x2, aortic plaque);  b.) rate/rhythm maintained on oral metoprolol  succinate; chronically anticoagulated using dose reduced apixaban .   Bilateral carotid artery disease    CAD (coronary artery disease)    a.) LHC 02/09/2007: EF 60%, LVEDP 25 mmHg, normal cors; b.) cCTA 11/27/2011: Ca score 129.73 --> 1.17 RCA, 16.36 LM, 112.21 LAD, 0 LCx; c.) MPI 03/13/2014 - normal.   Cataract    CKD (chronic kidney disease), stage IV (HCC)    Diastolic dysfunction 02/09/2008   a.) LHC 02/09/2008: EF 60%, LVEDP 25 mmHg; b.) TTE 08/07/2010: EF 65%, mild LVH, mild MR, G2DD; b.) TTE 08/20/2018: EF 45-50%, inferior HK, mild LAE; c.) TTE 08/11/2021: EF 45-50%. 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     allopurinol  (ZYLOPRIM ) 100 MG  tablet Take 0.5 tablets (50 mg total) by mouth daily. 15 tablet 6   Cholecalciferol  (VITAMIN D ) 50 MCG (2000 UT) tablet Take 2,000 Units by mouth daily.     Cyanocobalamin  (B-12) 1000 MCG TABS TAKE ONE TABLET BY MOUTH EVERY DAY 100 tablet 1   EPINEPHrine  0.3 mg/0.3 mL IJ SOAJ injection Inject 0.3 mg into the muscle as needed for anaphylaxis. 1 each 3   fluticasone  (FLONASE ) 50 MCG/ACT nasal spray Place 2 sprays into both nostrils as needed. 16 g 1   furosemide  (LASIX ) 20 MG tablet Take 0.5 tablets (10 mg total) by mouth daily as needed (shortness of breath, or swelling, or daily weight gain greater than 3 pounds). 90 tablet 3   losartan (COZAAR) 25 MG tablet Take 25 mg by mouth daily.     metoprolol  succinate (TOPROL -XL) 25 MG  24 hr tablet Take 0.5 tablets (12.5 mg total) by mouth daily. 45 tablet 3   olopatadine  (PATANOL) 0.1 % ophthalmic solution Place 1 drop into both eyes 2 (two) times daily as needed for allergies. 5 mL 2   XARELTO  15 MG TABS tablet TAKE ONE TABLET BY MOUTH EVERY DAY WITH SUPPER 90 tablet 3   No current facility-administered medications for this visit.    Allergies  Allergen Reactions   Lisinopril Swelling   Mixed Grasses Anaphylaxis   Bee Venom Swelling   Lactose Other (See Comments)    LACTOSE INTOLERANCE   Azithromycin Diarrhea   Baclofen Diarrhea   Iodine     CKD   Morphine  And Codeine Nausea And Vomiting   [place] Clam (M. Mercenaria) Skin Test Diarrhea    Mussels.   Oyster Extract Diarrhea    Mussels.   Sulfa Antibiotics Rash      Social History   Socioeconomic History   Marital status: Widowed    Spouse name: Not on file   Number of children: Not on file   Years of education: Not on file   Highest education level: Not on file  Occupational History   Not on file  Tobacco Use   Smoking status: Never    Passive exposure: Past (20yrs of exporsure from parents growing up)   Smokeless tobacco: Never  Vaping Use   Vaping status: Never  Used  Substance and Sexual Activity   Alcohol use: Not Currently   Drug use: Never   Sexual activity: Not Currently  Other Topics Concern   Not on file  Social History Narrative   Not on file   Social Drivers of Health   Financial Resource Strain: Low Risk  (03/14/2024)   Received from [facility]   Overall Financial Resource Strain (CARDIA)    Difficulty of Paying Living Expenses: Not hard at all  Food Insecurity: No Food Insecurity (05/09/2024)   Hunger Vital Sign    Worried About Running Out of Food in the Last Year: Never true    Ran Out of Food in the Last Year: Never true  Transportation Needs: No Transportation Needs (05/09/2024)   PRAPARE - Administrator, Civil Service (Medical): No    Lack of Transportation (Non-Medical): No  Physical Activity: Not on file  Stress: Not on file  Social Connections: Not on file  Intimate Partner Violence: Not At Risk (05/09/2024)   Humiliation, Afraid, Rape, and Kick questionnaire    Fear of Current or Ex-Partner: No    Emotionally Abused: No    Physically Abused: No    Sexually Abused: No      Family History  Adopted: Yes  Problem Relation Age of Onset   Heart disease Sister    Stroke Brother     Vitals:   05/31/24 1342  BP: (!) 149/71  Pulse: 83  SpO2: 98%  Weight: 148 lb (67.1 kg)   Wt Readings from Last 3 Encounters:  05/31/24 148 lb (67.1 kg)  05/26/24 147 lb 9.6 oz (67 kg)  05/23/24 177 lb 12.8 oz (80.6 kg)   Lab Results  Component Value Date   CREATININE 1.56 (H) 05/08/2024   CREATININE 1.23 (H) 05/07/2024   CREATININE 1.60 (H) 03/06/2024    PHYSICAL EXAM:  General: Well appearing.  Cor: No JVD. Irregular rhythm, normal rate.  Lungs: clear Abdomen: soft, nontender, nondistended. Extremities: trace pitting edema bilateral lower legs to mid shin. + varicosities bilateral lower legs  Neuro:SABRA Affect pleasant   ECG: not done   ASSESSMENT & PLAN:  1: Nonischemic HF with  preserved EF- - suspect her symptoms are multifactorial due to moderate MR and her anemia - NYHA class II - euvolemic - weighing daily, understands parameters to call about - Echo 10/02/22: EF 60-65%, mild LVH, G2DD - Echo 05/08/24: EF 55-60% with mild LVH, G2DD, normal RV, moderate LAE, moderate MR, mild AR.  - continue furosemide  10mg  PRN; hasn't taken in ~ 1 month - discussed adding SLGT2 and potential benefits. She would like to defer until she sees nephrology later this week. With her just starting losartan 1 week ago, waiting is certainly resonable - current potassium too high for MRA - BNP 05/07/24 was 712.1  2: HTN- - BP 149/71 - saw PCP Arch) 08/25 - BMET 05/24/24 reviewed: sodium 138, potassium 5.4, creatinine 1.34, GFR 37  3: AF- - saw cardiology (Dunn) 10/25 - continue metoprolol  succinate 12.5mg  daily - continue rovaroxaban 15mg  daily  4: Anemia- - Hg 05/24/24 was 9.2  5: CKD- - saw nephrology (Kolluru) 10/25 - continue losartan 25mg  daily for renal protection  6: HLD- - unable to tolerate statin therapy due to myalgia - LDL 05/08/24 was 82    Return in 2 months, sooner if needed. If still doing well at that point, will have patient return PRN.   I spent 35 minutes reviewing records, interviewing/ examing patient and managing plan/ orders.    Ellouise DELENA Class, FNP 05/30/24

## 2024-05-31 ENCOUNTER — Ambulatory Visit: Attending: Family | Admitting: Family

## 2024-05-31 ENCOUNTER — Encounter: Payer: Self-pay | Admitting: Family

## 2024-05-31 VITALS — BP 149/71 | HR 83 | Wt 148.0 lb

## 2024-05-31 DIAGNOSIS — I130 Hypertensive heart and chronic kidney disease with heart failure and stage 1 through stage 4 chronic kidney disease, or unspecified chronic kidney disease: Secondary | ICD-10-CM | POA: Diagnosis not present

## 2024-05-31 DIAGNOSIS — J90 Pleural effusion, not elsewhere classified: Secondary | ICD-10-CM | POA: Insufficient documentation

## 2024-05-31 DIAGNOSIS — E785 Hyperlipidemia, unspecified: Secondary | ICD-10-CM | POA: Diagnosis not present

## 2024-05-31 DIAGNOSIS — R0602 Shortness of breath: Secondary | ICD-10-CM | POA: Diagnosis present

## 2024-05-31 DIAGNOSIS — I10 Essential (primary) hypertension: Secondary | ICD-10-CM

## 2024-05-31 DIAGNOSIS — Z8673 Personal history of transient ischemic attack (TIA), and cerebral infarction without residual deficits: Secondary | ICD-10-CM | POA: Insufficient documentation

## 2024-05-31 DIAGNOSIS — I081 Rheumatic disorders of both mitral and tricuspid valves: Secondary | ICD-10-CM | POA: Insufficient documentation

## 2024-05-31 DIAGNOSIS — Z79899 Other long term (current) drug therapy: Secondary | ICD-10-CM | POA: Insufficient documentation

## 2024-05-31 DIAGNOSIS — I4821 Permanent atrial fibrillation: Secondary | ICD-10-CM | POA: Insufficient documentation

## 2024-05-31 DIAGNOSIS — I2510 Atherosclerotic heart disease of native coronary artery without angina pectoris: Secondary | ICD-10-CM | POA: Insufficient documentation

## 2024-05-31 DIAGNOSIS — D631 Anemia in chronic kidney disease: Secondary | ICD-10-CM | POA: Diagnosis not present

## 2024-05-31 DIAGNOSIS — N1832 Chronic kidney disease, stage 3b: Secondary | ICD-10-CM

## 2024-05-31 DIAGNOSIS — D638 Anemia in other chronic diseases classified elsewhere: Secondary | ICD-10-CM

## 2024-05-31 DIAGNOSIS — I5032 Chronic diastolic (congestive) heart failure: Secondary | ICD-10-CM | POA: Insufficient documentation

## 2024-05-31 DIAGNOSIS — N184 Chronic kidney disease, stage 4 (severe): Secondary | ICD-10-CM | POA: Diagnosis not present

## 2024-05-31 NOTE — Patient Instructions (Signed)
 It was nice to meet you today!

## 2024-07-04 ENCOUNTER — Inpatient Hospital Stay: Attending: Internal Medicine | Admitting: Internal Medicine

## 2024-07-04 ENCOUNTER — Encounter: Payer: Self-pay | Admitting: Internal Medicine

## 2024-07-04 ENCOUNTER — Inpatient Hospital Stay

## 2024-07-04 VITALS — BP 174/67 | HR 73 | Temp 98.1°F | Resp 18 | Ht 59.0 in | Wt 149.0 lb

## 2024-07-04 DIAGNOSIS — I129 Hypertensive chronic kidney disease with stage 1 through stage 4 chronic kidney disease, or unspecified chronic kidney disease: Secondary | ICD-10-CM | POA: Diagnosis present

## 2024-07-04 DIAGNOSIS — Z79899 Other long term (current) drug therapy: Secondary | ICD-10-CM | POA: Diagnosis not present

## 2024-07-04 DIAGNOSIS — D649 Anemia, unspecified: Secondary | ICD-10-CM | POA: Insufficient documentation

## 2024-07-04 DIAGNOSIS — Z7901 Long term (current) use of anticoagulants: Secondary | ICD-10-CM | POA: Insufficient documentation

## 2024-07-04 DIAGNOSIS — D631 Anemia in chronic kidney disease: Secondary | ICD-10-CM | POA: Insufficient documentation

## 2024-07-04 DIAGNOSIS — N184 Chronic kidney disease, stage 4 (severe): Secondary | ICD-10-CM | POA: Insufficient documentation

## 2024-07-04 DIAGNOSIS — I4891 Unspecified atrial fibrillation: Secondary | ICD-10-CM | POA: Insufficient documentation

## 2024-07-04 NOTE — Patient Instructions (Signed)
#  Recommend gentle iron  [iron  biglycinate; 28 mg ] 1 pill a day.  This pill is unlikely to cause stomach upset or cause constipation.  Available Over the counter or talk to pharmacist.

## 2024-07-04 NOTE — Progress Notes (Signed)
 [name]  Patient Care Team: [name], NP as PCP - General (Geriatric Medicine) [name], MD as PCP - Cardiology (Cardiology) [name], [place] (Optometry) Rennie Cindy SAUNDERS, MD as Consulting Physician (Oncology)  CHIEF COMPLAINTS/PURPOSE OF CONSULTATION: ANEMIA   HEMATOLOGY HISTORY  # ANEMIA[Hb; MCV-platelets- WBC; Iron sat; ferritin;  GFR- CT/US ; EGD/colonoscopy-  # CKD- [Dr.]  HISTORY OF PRESENTING ILLNESS: Patient ambulating-independently. [place] by friend Brittney [name] 89 y.o.  female pleasant patient with a history of CKD and anemia; A-fib-on Xarelto  has been referred to us  for further evaluation of anemia.  Patient admits to mild to moderate fatigue.  Otherwise denies any shortness of breath or chest pain.  Any swelling in the legs.  She is not on any iron pills at this time.   Colonoscpoy-  2015  Chronic kidney disease: III [Dr.Kolluru] Blood in stools: none Blood in urine: none Difficulty swallowing:none Change of bowel movement/constipation: none Liver disease: none Alcohol: none Bariatric surgery:none   Vaginal bleeding: none Prior evaluation with hematology:none Prior bone marrow biopsy: none Oral iron: none  Constipation/dyspepsia: none- many many years.  Prior IV iron infusions: none   Review of Systems  Constitutional:  Positive for malaise/fatigue. Negative for chills, diaphoresis, fever and weight loss.  HENT:  Negative for nosebleeds and sore throat.   Eyes:  Negative for double vision.  Respiratory:  Negative for cough, hemoptysis, sputum production, shortness of breath and wheezing.   Cardiovascular:  Negative for chest pain, palpitations, orthopnea and leg swelling.  Gastrointestinal:  Negative for abdominal pain, blood in stool, constipation, diarrhea, heartburn, melena, nausea and vomiting.  Genitourinary:  Negative for dysuria, frequency and urgency.  Musculoskeletal:   Positive for back pain and joint pain.  Skin: Negative.  Negative for itching and rash.  Neurological:  Negative for dizziness, tingling, focal weakness, weakness and headaches.  Endo/Heme/Allergies:  Does not bruise/bleed easily.  Psychiatric/Behavioral:  Negative for depression. The patient is not nervous/anxious and does not have insomnia.      MEDICAL HISTORY:  Past Medical History:  Diagnosis Date   Adopted person    Allergy    Anemia    Angina pectoris    Aortic atherosclerosis    Arthritis    Atrial fibrillation (HCC)    a. ) CHA2DS2-VASc = 7 (age x 2, sex, HTN, TIA x2, aortic plaque);  b.) rate/rhythm maintained on oral metoprolol  succinate; chronically anticoagulated using dose reduced apixaban .   Bilateral carotid artery disease    CAD (coronary artery disease)    a.) LHC 02/09/2007: EF 60%, LVEDP 25 mmHg, normal cors; b.) cCTA 11/27/2011: Ca score 129.73 --> 1.17 RCA, 16.36 LM, 112.21 LAD, 0 LCx; c.) MPI 03/13/2014 - normal.   Cataract    CKD (chronic kidney disease), stage IV (HCC)    Diastolic dysfunction 02/09/2008   a.) LHC 02/09/2008: EF 60%, LVEDP 25 mmHg; b.) TTE 08/07/2010: EF 65%, mild LVH, mild MR, G2DD; b.) TTE 08/20/2018: EF 45-50%, inferior HK, mild LAE; c.) TTE 08/11/2021: EF 45-50%. 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   Left carotid bruit    Long term current use of anticoagulant    a.) apixaban    Lumbar stenosis    Lymphocytic colitis    Osteoporosis  TIA (transient ischemic attack) 07/2018   TIA (transient ischemic attack) 08/2021   Vitamin B12 deficiency    Wears hearing BILATERAL aids    Wears partial (bottom) dentures     SURGICAL HISTORY: Past Surgical History:  Procedure Laterality Date   BREAST EXCISIONAL BIOPSY Right 36 years ago   neg   BUNIONECTOMY Bilateral     COLONOSCOPY N/A 07/02/2008   KNEE ARTHROPLASTY Right 03/11/2022   Procedure: COMPUTER ASSISTED TOTAL KNEE ARTHROPLASTY;  Surgeon: Mardee Lynwood SQUIBB, MD;  Location: [facility] ORS;  Service: Orthopedics;  Laterality: Right;   KNEE ARTHROSCOPY Right 2006   LEFT HEART CATH AND CORONARY ANGIOGRAPHY Left 02/09/2007   Procedure: LEFT HEART CATH AND CORONARY ANGIOGRAPHY; Location: [facility]   TONSILLECTOMY AND ADENOIDECTOMY Bilateral    TOTAL ABDOMINAL HYSTERECTOMY W/ BILATERAL SALPINGOOPHORECTOMY N/A    TOTAL KNEE ARTHROPLASTY Left 04/26/2014   TUBAL LIGATION      SOCIAL HISTORY: Social History   Socioeconomic History   Marital status: Widowed    Spouse name: Not on file   Number of children: Not on file   Years of education: Not on file   Highest education level: Not on file  Occupational History   Not on file  Tobacco Use   Smoking status: Never    Passive exposure: Past (79yrs of exporsure from parents growing up)   Smokeless tobacco: Never  Vaping Use   Vaping status: Never Used  Substance and Sexual Activity   Alcohol use: Not Currently   Drug use: Never   Sexual activity: Not Currently  Other Topics Concern   Not on file  Social History Narrative   Not on file   Social Drivers of Health   Financial Resource Strain: Low Risk  (03/14/2024)   Received [facility]   Overall Financial Resource Strain (CARDIA)    Difficulty of Paying Living Expenses: Not hard at all  Food Insecurity: No Food Insecurity (07/04/2024)   Hunger Vital Sign    Worried About Running Out of Food in the Last Year: Never true    Ran Out of Food in the Last Year: Never true  Transportation Needs: No Transportation Needs (07/04/2024)   PRAPARE - Administrator, Civil Service (Medical): No    Lack of Transportation (Non-Medical): No  Physical Activity: Not on file  Stress: Not on file  Social Connections: Not on file  Intimate Partner Violence: Not At Risk  (07/04/2024)   Humiliation, Afraid, Rape, and Kick questionnaire    Fear of Current or Ex-Partner: No    Emotionally Abused: No    Physically Abused: No    Sexually Abused: No    FAMILY HISTORY: Family History  Adopted: Yes  Problem Relation Age of Onset   Heart disease Sister    Stroke Brother     ALLERGIES:  is allergic to lisinopril, mixed grasses, bee venom, lactose, azithromycin, baclofen, iodine, morphine  and codeine, [place] clam (m. mercenaria) skin test, oyster extract, and sulfa antibiotics.  MEDICATIONS:  Current Outpatient Medications  Medication Sig Dispense Refill   acetaminophen  (TYLENOL ) 500 MG tablet Take 1,000 mg by mouth 2 (two) times daily as needed.     allopurinol  (ZYLOPRIM ) 100 MG tablet Take 0.5 tablets (50 mg total) by mouth daily. 15 tablet 6   Cholecalciferol  (VITAMIN D ) 50 MCG (2000 UT) tablet Take 2,000 Units by mouth daily.     Cyanocobalamin  (B-12) 1000 MCG TABS TAKE ONE TABLET BY MOUTH EVERY DAY 100 tablet  1   EPINEPHrine  0.3 mg/0.3 mL IJ SOAJ injection Inject 0.3 mg into the muscle as needed for anaphylaxis. 1 each 3   fluticasone  (FLONASE ) 50 MCG/ACT nasal spray Place 2 sprays into both nostrils as needed. 16 g 1   furosemide  (LASIX ) 20 MG tablet Take 0.5 tablets (10 mg total) by mouth daily as needed (shortness of breath, or swelling, or daily weight gain greater than 3 pounds). 90 tablet 3   losartan (COZAAR) 25 MG tablet Take 25 mg by mouth daily.     metoprolol  succinate (TOPROL -XL) 25 MG 24 hr tablet Take 0.5 tablets (12.5 mg total) by mouth daily. 45 tablet 3   olopatadine  (PATANOL) 0.1 % ophthalmic solution Place 1 drop into both eyes 2 (two) times daily as needed for allergies. 5 mL 2   XARELTO  15 MG TABS tablet TAKE ONE TABLET BY MOUTH EVERY DAY WITH SUPPER 90 tablet 3   No current facility-administered medications for this visit.     SABRA  PHYSICAL EXAMINATION:   Vitals:   07/04/24 1112  BP: (!) 174/67  Pulse: 73  Resp: 18   Temp: 98.1 F (36.7 C)  SpO2: 99%   Filed Weights   07/04/24 1112  Weight: 149 lb (67.6 kg)    Physical Exam Vitals and nursing note reviewed.  HENT:     Head: Normocephalic and atraumatic.     Mouth/Throat:     Pharynx: Oropharynx is clear.  Eyes:     Extraocular Movements: Extraocular movements intact.     Pupils: Pupils are equal, round, and reactive to light.  Cardiovascular:     Rate and Rhythm: Normal rate and regular rhythm.  Pulmonary:     Comments: Decreased breath sounds bilaterally.  Abdominal:     Palpations: Abdomen is soft.  Musculoskeletal:        General: Normal range of motion.     Cervical back: Normal range of motion.  Skin:    General: Skin is warm.  Neurological:     General: No focal deficit present.     Mental Status: She is alert and oriented to person, place, and time.  Psychiatric:        Behavior: Behavior normal.        Judgment: Judgment normal.      LABORATORY DATA:  I have reviewed the data as listed Lab Results  Component Value Date   WBC 7.6 05/08/2024   HGB 9.9 (L) 05/08/2024   HCT 31.9 (L) 05/08/2024   MCV 91.4 05/08/2024   PLT 480 (H) 05/08/2024   Recent Labs    01/03/24 0729 03/06/24 1151 05/07/24 1006 05/07/24 1106 05/08/24 0436  NA 142 141 140  --  140  K 4.7 5.2 4.2  --  3.7  CL 108 106 108  --  99  CO2 28 19* 22  --  27  GLUCOSE 89 81 110*  --  100*  BUN 39* 32 31*  --  31*  CREATININE 1.47* 1.60* 1.23*  --  1.56*  CALCIUM  9.6 10.0 9.5  --  9.8  GFRNONAA  --   --  41*  --  31*  PROT 6.2  --   --  6.4*  --   ALBUMIN  --   --   --  3.8  --   AST 18  --   --  21  --   ALT 7  --   --  7  --   ALKPHOS  --   --   --  55  --   BILITOT 0.9  --   --  1.4*  --   BILIDIR  --   --   --  0.2  --   IBILI  --   --   --  1.2*  --      No results found.  ASSESSMENT & PLAN:   Symptomatic anemia # Chronic anemia-normocytic recently getting worse. [October 2085 hemoglobin 9.6 ferritin 12-nephrology Dr. Adelbert   Patient is quite symptomatic from anemia . The etiology is likely chronic renal disease.   I had a long discussion with patient regarding multiple other etiologies of anemia-including nutritional; malabsorption; primary bone marrow disorders etc.   # At next visit -I recommend CBC CMP LDH peripheral smear; haptoglobin; erythropoietin; iron studies ferritin B12 folic acid; reticulocyte count multiple myeloma panel.  Kappa lambda light chain ratio. HOLD of bone marrow Biopsy at this time; pending above work up.   # Discussed at length the pathophysiology of anemia from chronic kidney disease which includes decreased erythropoietin production; and decreased iron stores in the body.  I discussed that I would recommend using iron infusion/Venofer; along with bone marrow stimulating agents /like erythropoietin to maintain hemoglobin around 10-11.   I would recommend the goal hemoglobin between 10-11 as there is a increased risk of thromboembolic events in the target hemoglobin is around 12 to 13.     I discussed the potential acute infusion reactions with IV iron; which are quite rare.  Patient understands the risk; will proceed with infusions if hemoglobin does not improve.  At this time continue oral iron recommend gentle iron.  # CKD- stage [Dr. Kolluru]- III- stable.   # A-fib-on Xarelto  [Dr.Agbor-Etang]-stable.  Thank you Dr. Douglas MD for allowing me to participate in the care of your pleasant patient. Please do not hesitate to contact me with questions or concerns in the interim.  # DISPOSITION: # NO labs today  # follow up 2  months- MD ; labs- cbc/cmp; iron studies; ferritin; MM panel; K/l light chains; retic count; Erythropoietin- possible venofer-  Dr.B    All questions were answered. The patient knows to call the clinic with any problems, questions or concerns.    Cindy JONELLE Joe, MD 07/04/2024 3:17 PM

## 2024-07-04 NOTE — Assessment & Plan Note (Addendum)
#   Chronic anemia-normocytic recently getting worse. [October 2085 hemoglobin 9.6 ferritin 12-nephrology Dr. Adelbert  Patient is quite symptomatic from anemia . The etiology is likely chronic renal disease.   I had a long discussion with patient regarding multiple other etiologies of anemia-including nutritional; malabsorption; primary bone marrow disorders etc.   # At next visit -I recommend CBC CMP LDH peripheral smear; haptoglobin; erythropoietin; iron studies ferritin B12 folic acid; reticulocyte count multiple myeloma panel.  Kappa lambda light chain ratio. HOLD of bone marrow Biopsy at this time; pending above work up.   # Discussed at length the pathophysiology of anemia from chronic kidney disease which includes decreased erythropoietin production; and decreased iron stores in the body.  I discussed that I would recommend using iron infusion/Venofer; along with bone marrow stimulating agents /like erythropoietin to maintain hemoglobin around 10-11.   I would recommend the goal hemoglobin between 10-11 as there is a increased risk of thromboembolic events in the target hemoglobin is around 12 to 13.     I discussed the potential acute infusion reactions with IV iron; which are quite rare.  Patient understands the risk; will proceed with infusions if hemoglobin does not improve.  At this time continue oral iron recommend gentle iron.  # CKD- stage [Dr. Kolluru]- III- stable.   # A-fib-on Xarelto  [Dr.Agbor-Etang]-stable.  Thank you Dr. Douglas MD for allowing me to participate in the care of your pleasant patient. Please do not hesitate to contact me with questions or concerns in the interim.  # DISPOSITION: # NO labs today  # follow up 2  months- MD ; labs- cbc/cmp; iron studies; ferritin; MM panel; K/l light chains; retic count; Erythropoietin- possible venofer-  Dr.B

## 2024-07-25 ENCOUNTER — Other Ambulatory Visit: Payer: Self-pay | Admitting: Nurse Practitioner

## 2024-07-25 DIAGNOSIS — I4821 Permanent atrial fibrillation: Secondary | ICD-10-CM

## 2024-07-27 ENCOUNTER — Ambulatory Visit: Payer: Self-pay

## 2024-07-27 NOTE — Telephone Encounter (Signed)
 See notes from triage nurse as FYI

## 2024-07-27 NOTE — Telephone Encounter (Signed)
 Does not look like she rescheduled, will you call and make follow up appt with her

## 2024-07-27 NOTE — Telephone Encounter (Signed)
 Tried calling patient to schedule an appointment. LMOM to return call.

## 2024-07-27 NOTE — Telephone Encounter (Signed)
 FYI Only or Action Required?: Action required by provider: request for appointment. Had to cancel 08/01/2024 visit and needs to reschedule for after that date. Going to emerg Ortho today 07/27/2024  Patient was last seen in primary care on 05/16/2024 by Brittney Harlene POUR, NP.  Called Nurse Triage reporting Fall.  Symptoms began several days ago.  Interventions attempted: OTC medications: tylenol  in the AM as usual.  Symptoms are: stable.  Triage Disposition: Home Care  Patient/caregiver understands and will follow disposition?: Yes  Copied from CRM [id number]. Topic: Clinical - Red Word Triage >> Jul 27, 2024 11:08 AM Brittney [name] wrote: Red Word that prompted transfer to Nurse Triage: While rescheduling the patients 12/9 appointment she mentioned she had a fall on Saturday. Reason for Disposition  Small bruise is present  Answer Assessment - Initial Assessment Questions No open wounds. Can use hand okay. Can open and close hand. Is going to emerg ortho today.  1. MECHANISM: How did the fall happen?     Tripped over some wires. Black and blue bruise left leg, sore hand swelling and bruising. 2. DOMESTIC VIOLENCE AND ELDER ABUSE SCREENING: Did you fall because someone pushed you or tried to hurt you? If Yes, ask: Are you safe now?     Denies 3. ONSET: When did the fall happen? (e.g., minutes, hours, or days ago)     Saturday 5. INJURY: Did you hurt (injure) yourself when you fell? If Yes, ask: What did you injure? Tell me more about this? (e.g., body area; type of injury; pain severity)     Yes, fell on left leg and wrist. No open wounds. No pain.  6. PAIN: Is there any pain? If Yes, ask: How bad is the pain? (e.g., Scale 0-10; or none, mild,      0 10. CAUSE: What do you think caused the fall (or falling)? (e.g., dizzy spell, tripped)       [name] over wires on the ground.  Protocols used: Falls and [facility]

## 2024-07-28 DIAGNOSIS — S62317A Displaced fracture of base of fifth metacarpal bone. left hand, initial encounter for closed fracture: Secondary | ICD-10-CM | POA: Insufficient documentation

## 2024-07-31 ENCOUNTER — Other Ambulatory Visit: Payer: Self-pay | Admitting: Nurse Practitioner

## 2024-07-31 NOTE — Telephone Encounter (Signed)
 Spoke with patient, Appointment scheduled for 08/08/2024

## 2024-08-01 ENCOUNTER — Ambulatory Visit: Payer: Self-pay | Admitting: Nurse Practitioner

## 2024-08-01 ENCOUNTER — Telehealth: Payer: Self-pay | Admitting: Family

## 2024-08-01 NOTE — Progress Notes (Unsigned)
 [facility] Note   Referring Physician: Dr Isadora PCP: [name], NP Cardiologist: Redell Cave, MD / Abigail Motto, [place]  Chief Complaint: shortness of breath   HPI:  Brittney [name] is a 89 y/o female with a history of HFpEF, HTN, CKD, hyperlipidemia, CAD (non-obstructive), permanent AF (03/22), GERD, TIA, anemia of chronic disease.   Previously followed by [facility], and underwent cardiac cath in 2008 which showed no angiographically apparent CAD in the major epicardial arteries. Calcium  score in 2013 of 129.73. Nuclear stress test in 2015 showed no evidence of ischemia with normal LV function.   Admitted to the hospital in 2019 with a suspected TIA. Carotid artery ultrasound at that time showed no hemodynamically significant stenosis in the bilateral carotid arteries with antegrade flow of the bilateral vertebral arteries. Echo demonstrated an EF of 45 to 50% with hypokinesis of the inferior myocardium and a mildly dilated left atrium.   Echo in 07/2021 showed a stable LV systolic function with an EF of 45 to 50%, no regional wall motion abnormalities, indeterminate LV diastolic function parameters, normal RV systolic function and ventricular cavity size, severely dilated left atrium, mildly dilated right atrium, mild to moderate mitral regurgitation, mild to moderate tricuspid regurgitation, and an estimated right atrial pressure of 8 mmHg   Echo in 09/2022 showed an EF of 60 to 65%, no regional wall motion abnormalities, mild LVH, grade 2 diastolic dysfunction, normal RV systolic function and ventricular cavity size, mildly elevated RVSP at 38.8 mmHg, moderately dilated left atrium, mild to moderate mitral regurgitation, mild to moderate tricuspid regurgitation, mild aortic insufficiency, and an estimated right atrial pressure of 3 mmHg.   Admitted 05/07/24 with chest pain and back pain with deep inspiration and dyspnea on exertion. She declined to undergo CTA  PE citing concern with contrast dye and kidney function. Has not been taking lasix  in years due to a bout with pancreatitis. SpO2 94% on room air. CXR obtained and shows small bilateral pleural effusions, bibasilar opacities, and mild CHF. CT chest obtained showed lower lobe peribronchial thickening, bilateral pleural effusions, and ground glass attenuation.  BNP 712.1. IV diuresed. Echo 05/08/24: EF 55-60% with mild LVH, G2DD, normal RV, moderate LAE, moderate MR, mild AR.   She presents today, with a friend, for her initial HF visit with a chief complaint of intermittent shortness of breath. Has associated intermittent dizziness. Difficulty sleeping due to mind racing. She noticed some shortness of breath walking up the sidewalk to the office today although it resolved quickly. She was started on losartan 1 week ago with the plan to begin SGLT2 at next visit. She hasn't had to take lasix  in ~ 1 month.   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 She feels like that is what is causing her to not sleep well because she has a lot on her mind right now.    Review of Systems: [y] = yes, [ ]  = no   General: Weight gain [ ] ; Weight loss [ ] ; Anorexia [ ] ; Fatigue [ ] ; Fever [ ] ; Chills [ ] ; Weakness [ ]   Cardiac: Chest pain/pressure [ ] ; Resting SOB [ ] ; Exertional SOB [ y]; Orthopnea [ ] ; Pedal Edema [ ] ; Palpitations [ ] ; Syncope [ ] ; Presyncope [ ] ; Paroxysmal nocturnal dyspnea[ ]   Pulmonary: Cough [ ] ; Wheezing[ ] ; Hemoptysis[ ] ; Sputum [ ] ; Snoring [ ]   GI: Vomiting[ ] ; Dysphagia[ ] ; Melena[ ] ; Hematochezia [ ] ; Heartburn[ ] ; Abdominal pain [ ] ; Constipation [ ] ; Diarrhea [ ] ; BRBPR [ ]   GU: Hematuria[ ] ; Dysuria [ ] ; Nocturia[ ]   Vascular: Pain in legs with walking [ ] ; Pain in feet with lying flat [ ] ; Non-healing sores [ ] ; Stroke [ ] ; TIA [ ] ; Slurred speech [ ] ;  Neuro: Dizziness[y ]; Vertigo[ ] ; Seizures[ ] ;  Paresthesias[ ] ;Blurred vision [ ] ; Diplopia [ ] ; Vision changes [ ]   Ortho/Skin: Arthritis [ ] ; Joint pain [ ] ; Muscle pain [ ] ; Joint swelling [ ] ; Back Pain [ ] ; Rash [ ]   Psych: Depression[ ] ; Anxiety[ ]   Heme: Bleeding problems [ ] ; Clotting disorders [ ] ; Anemia davis.dad ]  Endocrine: Diabetes [ ] ; Thyroid dysfunction[ ]    Past Medical History:  Diagnosis Date   Adopted person    Allergy    Anemia    Angina pectoris    Aortic atherosclerosis    Arthritis    Atrial fibrillation (HCC)    a. ) CHA2DS2-VASc = 7 (age x 2, sex, HTN, TIA x2, aortic plaque);  b.) rate/rhythm maintained on oral metoprolol  succinate; chronically anticoagulated using dose reduced apixaban .   Bilateral carotid artery disease    CAD (coronary artery disease)    a.) LHC 02/09/2007: EF 60%, LVEDP 25 mmHg, normal cors; b.) cCTA 11/27/2011: Ca score 129.73 --> 1.17 RCA, 16.36 LM, 112.21 LAD, 0 LCx; c.) MPI 03/13/2014 - normal.   Cataract    CKD (chronic kidney disease), stage IV (HCC)    Diastolic dysfunction 02/09/2008   a.) LHC 02/09/2008: EF 60%, LVEDP 25 mmHg; b.) TTE 08/07/2010: EF 65%, mild LVH, mild MR, G2DD; b.) TTE 08/20/2018: EF 45-50%, inferior HK, mild LAE; c.) TTE 08/11/2021: EF 45-50%. 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     allopurinol  (ZYLOPRIM ) 100 MG  tablet TAKE 1/2 TABLET BY MOUTH ONCE DAILY 15 tablet 6   Cholecalciferol  (VITAMIN D ) 50 MCG (2000 UT) tablet Take 2,000 Units by mouth daily.     Cyanocobalamin  (B-12) 1000 MCG TABS TAKE ONE TABLET BY MOUTH ONCE DAILY 100 tablet 1   EPINEPHrine  0.3 mg/0.3 mL IJ SOAJ injection Inject 0.3 mg into the muscle as needed for anaphylaxis. 1 each 3   fluticasone  (FLONASE ) 50 MCG/ACT nasal spray Place 2 sprays into both nostrils as needed. 16 g 1   furosemide  (LASIX ) 20 MG tablet Take 0.5 tablets (10 mg total) by mouth daily as needed (shortness of breath, or swelling, or daily weight gain greater than 3 pounds). 90 tablet 3   losartan (COZAAR) 25 MG tablet Take 25 mg by mouth daily.     metoprolol  succinate (TOPROL -XL) 25 MG 24 hr  tablet TAKE 1 TABLET BY MOUTH DAILY 90 tablet 3   olopatadine  (PATANOL) 0.1 % ophthalmic solution Place 1 drop into both eyes 2 (two) times daily as needed for allergies. 5 mL 2   XARELTO  15 MG TABS tablet TAKE ONE TABLET BY MOUTH EVERY DAY WITH SUPPER 90 tablet 3   No current facility-administered medications for this visit.    Allergies  Allergen Reactions   Lisinopril Swelling   Mixed Grasses Anaphylaxis   Bee Venom Swelling   Lactose Other (See Comments)    LACTOSE INTOLERANCE   Azithromycin Diarrhea   Baclofen Diarrhea   Iodine     CKD   Morphine  And Codeine Nausea And Vomiting   [place] Clam (M. Mercenaria) Skin Test Diarrhea    Mussels.   Oyster Extract Diarrhea    Mussels.   Sulfa Antibiotics Rash      Social History   Socioeconomic History   Marital status: Widowed    Spouse name: Not on file   Number of children: Not on file   Years of education: Not on file   Highest education level: Not on file  Occupational History   Not on file  Tobacco Use   Smoking status: Never    Passive exposure: Past (100yrs of exporsure from parents growing up)   Smokeless tobacco: Never  Vaping Use   Vaping status: Never Used  Substance and Sexual  Activity   Alcohol use: Not Currently   Drug use: Never   Sexual activity: Not Currently  Other Topics Concern   Not on file  Social History Narrative   Not on file   Social Drivers of Health   Financial Resource Strain: Low Risk  (03/14/2024)   Received from [facility]   Overall Financial Resource Strain (CARDIA)    Difficulty of Paying Living Expenses: Not hard at all  Food Insecurity: No Food Insecurity (07/04/2024)   Hunger Vital Sign    Worried About Running Out of Food in the Last Year: Never true    Ran Out of Food in the Last Year: Never true  Transportation Needs: No Transportation Needs (07/04/2024)   PRAPARE - Administrator, Civil Service (Medical): No    Lack of Transportation (Non-Medical): No  Physical Activity: Not on file  Stress: Not on file  Social Connections: Not on file  Intimate Partner Violence: Not At Risk (07/04/2024)   Humiliation, Afraid, Rape, and Kick questionnaire    Fear of Current or Ex-Partner: No    Emotionally Abused: No    Physically Abused: No    Sexually Abused: No      Family History  Adopted: Yes  Problem Relation Age of Onset   Heart disease Sister    Stroke Brother     There were no vitals filed for this visit.  Wt Readings from Last 3 Encounters:  07/04/24 149 lb (67.6 kg)  05/31/24 148 lb (67.1 kg)  05/26/24 147 lb 9.6 oz (67 kg)   Lab Results  Component Value Date   CREATININE 1.56 (H) 05/08/2024   CREATININE 1.23 (H) 05/07/2024   CREATININE 1.60 (H) 03/06/2024    PHYSICAL EXAM:  General: Well appearing.  Cor: No JVD. Irregular rhythm, normal rate.  Lungs: clear Abdomen: soft, nontender, nondistended. Extremities: trace pitting edema bilateral lower legs to mid shin. + varicosities bilateral lower legs Neuro:. Affect pleasant   ECG: not done   ASSESSMENT & PLAN:  1: Nonischemic HF with preserved EF- -  suspect her symptoms are multifactorial due to moderate MR and her  anemia - NYHA class II - euvolemic - weighing daily, understands parameters to call about - Echo 10/02/22: EF 60-65%, mild LVH, G2DD - Echo 05/08/24: EF 55-60% with mild LVH, G2DD, normal RV, moderate LAE, moderate MR, mild AR.  - continue furosemide  10mg  PRN; hasn't taken in ~ 1 month - discussed adding SLGT2 and potential benefits. She would like to defer until she sees nephrology later this week. With her just starting losartan 1 week ago, waiting is certainly resonable - current potassium too high for MRA - BNP 05/07/24 was 712.1  2: HTN- - BP 149/71 - saw PCP Arch) 08/25 - BMET 05/24/24 reviewed: sodium 138, potassium 5.4, creatinine 1.34, GFR 37  3: AF- - saw cardiology (Dunn) 10/25 - continue metoprolol  succinate 12.5mg  daily - continue rovaroxaban 15mg  daily  4: Anemia- - Hg 05/24/24 was 9.2  5: CKD- - saw nephrology (Kolluru) 10/25 - continue losartan 25mg  daily for renal protection  6: HLD- - unable to tolerate statin therapy due to myalgia - LDL 05/08/24 was 82    Return in 2 months, sooner if needed. If still doing well at that point, will have patient return PRN.   I spent 35 minutes reviewing records, interviewing/ examing patient and managing plan/ orders.    Ellouise DELENA Class, FNP 08/01/24

## 2024-08-01 NOTE — Telephone Encounter (Signed)
 Called to confirm/remind patient of their appointment at the [facility] on 08/02/24.   Appointment:   [x] Confirmed  [] Left mess   [] No answer/No voice mail  [] VM Full/unable to leave message  [] Phone not in service  Patient reminded to bring all medications and/or complete list.  Confirmed patient has transportation. Gave directions, instructed to utilize valet parking.

## 2024-08-02 ENCOUNTER — Ambulatory Visit: Attending: Family | Admitting: Family

## 2024-08-02 ENCOUNTER — Encounter: Payer: Self-pay | Admitting: Family

## 2024-08-02 VITALS — BP 168/78 | HR 87 | Wt 150.2 lb

## 2024-08-02 DIAGNOSIS — R0602 Shortness of breath: Secondary | ICD-10-CM | POA: Insufficient documentation

## 2024-08-02 DIAGNOSIS — R350 Frequency of micturition: Secondary | ICD-10-CM | POA: Insufficient documentation

## 2024-08-02 DIAGNOSIS — R5383 Other fatigue: Secondary | ICD-10-CM | POA: Insufficient documentation

## 2024-08-02 DIAGNOSIS — Z8673 Personal history of transient ischemic attack (TIA), and cerebral infarction without residual deficits: Secondary | ICD-10-CM | POA: Insufficient documentation

## 2024-08-02 DIAGNOSIS — I2510 Atherosclerotic heart disease of native coronary artery without angina pectoris: Secondary | ICD-10-CM | POA: Insufficient documentation

## 2024-08-02 DIAGNOSIS — I5032 Chronic diastolic (congestive) heart failure: Secondary | ICD-10-CM | POA: Insufficient documentation

## 2024-08-02 DIAGNOSIS — I4821 Permanent atrial fibrillation: Secondary | ICD-10-CM | POA: Diagnosis not present

## 2024-08-02 DIAGNOSIS — I428 Other cardiomyopathies: Secondary | ICD-10-CM | POA: Insufficient documentation

## 2024-08-02 DIAGNOSIS — N184 Chronic kidney disease, stage 4 (severe): Secondary | ICD-10-CM | POA: Insufficient documentation

## 2024-08-02 DIAGNOSIS — Z79899 Other long term (current) drug therapy: Secondary | ICD-10-CM | POA: Insufficient documentation

## 2024-08-02 DIAGNOSIS — D631 Anemia in chronic kidney disease: Secondary | ICD-10-CM | POA: Insufficient documentation

## 2024-08-02 DIAGNOSIS — N1832 Chronic kidney disease, stage 3b: Secondary | ICD-10-CM | POA: Diagnosis not present

## 2024-08-02 DIAGNOSIS — I10 Essential (primary) hypertension: Secondary | ICD-10-CM

## 2024-08-02 DIAGNOSIS — D638 Anemia in other chronic diseases classified elsewhere: Secondary | ICD-10-CM | POA: Diagnosis not present

## 2024-08-02 DIAGNOSIS — E785 Hyperlipidemia, unspecified: Secondary | ICD-10-CM | POA: Diagnosis not present

## 2024-08-02 DIAGNOSIS — I130 Hypertensive heart and chronic kidney disease with heart failure and stage 1 through stage 4 chronic kidney disease, or unspecified chronic kidney disease: Secondary | ICD-10-CM | POA: Insufficient documentation

## 2024-08-02 NOTE — Patient Instructions (Signed)
 Medication Changes:  INCREASE Metoprolol  to 25mg  (1 whole tab) daily   Follow-Up in: Please follow up with the [facility] in 6 months with Ellouise Class, FNP.   Thank you for choosing [facility].    At the [facility], you and your health needs are our priority. We have a designated team specialized in the treatment of Heart Failure. This Care Team includes your primary Heart Failure Specialized Cardiologist (physician), Advanced Practice Providers (APPs- Physician Assistants and Nurse Practitioners), and Pharmacist who all work together to provide you with the care you need, when you need it.   You may see any of the following providers on your designated Care Team at your next follow up:  Dr. Toribio Fuel Dr. Ezra Shuck Dr. Ria Commander Dr. Morene Brownie Ellouise Class, FNP Jaun Bash, RPH-CPP  Please be sure to bring in all your medications bottles to every appointment.   Need to Contact Us :  If you have any questions or concerns before your next appointment please send us  a message through [place] or call our office at [phone].    TO LEAVE A MESSAGE FOR THE NURSE SELECT OPTION 2, PLEASE LEAVE A MESSAGE INCLUDING: YOUR NAME DATE OF BIRTH CALL BACK NUMBER REASON FOR CALL**this is important as we prioritize the call backs  YOU WILL RECEIVE A CALL BACK THE SAME DAY AS LONG AS YOU CALL BEFORE 4:00 PM

## 2024-08-08 ENCOUNTER — Non-Acute Institutional Stay: Admitting: Nurse Practitioner

## 2024-08-08 ENCOUNTER — Encounter: Payer: Self-pay | Admitting: Nurse Practitioner

## 2024-08-08 VITALS — BP 138/78 | HR 85 | Temp 98.0°F | Ht 59.0 in | Wt 149.0 lb

## 2024-08-08 DIAGNOSIS — I5032 Chronic diastolic (congestive) heart failure: Secondary | ICD-10-CM | POA: Diagnosis not present

## 2024-08-08 DIAGNOSIS — N1832 Chronic kidney disease, stage 3b: Secondary | ICD-10-CM | POA: Diagnosis not present

## 2024-08-08 DIAGNOSIS — M109 Gout, unspecified: Secondary | ICD-10-CM

## 2024-08-08 DIAGNOSIS — D631 Anemia in chronic kidney disease: Secondary | ICD-10-CM | POA: Diagnosis not present

## 2024-08-08 DIAGNOSIS — I4821 Permanent atrial fibrillation: Secondary | ICD-10-CM | POA: Diagnosis not present

## 2024-08-08 NOTE — Progress Notes (Signed)
 Careteam: Patient Care Team: Brittney Harlene POUR, NP as PCP - General (Geriatric Medicine) Brittney Rogue, MD as PCP - Cardiology (Cardiology) Brittney [name], Brittney [name] (Optometry) Brittney Cindy SAUNDERS, MD as Consulting Physician (Oncology) Brittney [name]:  Brittney [name]   Advanced Directive information Does Patient Have a Medical Advance Directive?: Yes, Type of Advance Directive: Healthcare Power of Brittney [name];Living will;Out of facility DNR (pink MOST or yellow form), Does patient want to make changes to medical advance directive?: No - Patient declined  Allergies[1]  Chief Complaint  Patient presents with   Fall    Follow up From Fall 2 weeks ago. Broke Left Hand.      HPI: Patient is a 89 y.o. female seen in today for follow up and fall.  Discussed the use of AI scribe software for clinical note transcription with the patient, who gave verbal consent to proceed.  History of Present Illness Brittney [name] is a 89 year old female who presents for follow-up after a fall resulting in a hand fracture.  She sustained a hand injury after tripping over wires while decorating. She did not seek immediate medical attention but noticed throbbing pain in her hand a few days later. She was diagnosed with a small fracture, and her hand remains swollen. She experiences occasional mild pain and uses a brace, which she removes for showering and sometimes at night. She has follow up with orthopedic scheduled in 2 weeks.   She experiences shortness of breath, particularly in the mornings, accompanied by a raspy chest sound. She has hx of a fib, chf, anemia and been told she has a large hiatal hernia and follows up with pulmonary and cardiology clinics. No leg swelling and no need for extra doses of Lasix  recently. She is able to walk her dog, though she experiences some breathing difficulties during the activity. No palpitations and she is on metoprolol  25 mg daily and Xarelto  for  anticoagulation.  She has a history of anemia and is under the care of a hematologist. She is taking iron supplements and is scheduled for blood work in January to assess her hemoglobin, blood counts, and other parameters.   Her bowel movements are regular, with occasional loose stools. No blood in urine or stools.   Review of Systems:  Review of Systems  Constitutional:  Negative for chills, fever and weight loss.  HENT:  Negative for tinnitus.   Respiratory:  Positive for shortness of breath. Negative for cough and sputum production.   Cardiovascular:  Negative for chest pain, palpitations and leg swelling.  Gastrointestinal:  Negative for abdominal pain, constipation, diarrhea and heartburn.  Genitourinary:  Negative for dysuria, frequency and urgency.  Musculoskeletal:  Negative for back pain, falls, joint pain and myalgias.  Skin: Negative.   Neurological:  Negative for dizziness and headaches.  Psychiatric/Behavioral:  Negative for depression and memory loss. The patient does not have insomnia.     Past Medical History:  Diagnosis Date   Adopted person    Allergy    Anemia    Angina pectoris    Aortic atherosclerosis    Arthritis    Atrial fibrillation (HCC)    a. ) CHA2DS2-VASc = 7 (age x 2, sex, HTN, TIA x2, aortic plaque);  b.) rate/rhythm maintained on oral metoprolol  succinate; chronically anticoagulated using dose reduced apixaban .   Bilateral carotid artery disease    CAD (coronary artery disease)    a.) LHC 02/09/2007: EF 60%, LVEDP 25 mmHg, normal cors; b.)  cCTA 11/27/2011: Ca score 129.73 --> 1.17 RCA, 16.36 LM, 112.21 LAD, 0 LCx; c.) MPI 03/13/2014 - normal.   Cataract    CKD (chronic kidney disease), stage IV (HCC)    Diastolic dysfunction 02/09/2008   a.) LHC 02/09/2008: EF 60%, LVEDP 25 mmHg; b.) TTE 08/07/2010: EF 65%, mild LVH, mild MR, G2DD; b.) TTE 08/20/2018: EF 45-50%, inferior HK, mild LAE; c.) TTE 08/11/2021: EF 45-50%. 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   Left carotid bruit    Long term current use of anticoagulant    a.) apixaban    Lumbar stenosis    Lymphocytic colitis    Osteoporosis    TIA (transient ischemic attack) 07/2018   TIA (transient ischemic attack) 08/2021   Vitamin B12 deficiency    Wears hearing BILATERAL aids    Wears partial (bottom) dentures    Past Surgical History:  Procedure Laterality Date   BREAST EXCISIONAL BIOPSY Right 36 years ago   neg   BUNIONECTOMY Bilateral    COLONOSCOPY N/A 07/02/2008   KNEE ARTHROPLASTY Right 03/11/2022   Procedure: COMPUTER ASSISTED TOTAL KNEE ARTHROPLASTY;  Surgeon: Mardee Lynwood SQUIBB, MD;  Location: [facility] ORS;  [name]: Orthopedics;  Laterality: Right;   KNEE ARTHROSCOPY Right 2006   LEFT HEART CATH AND CORONARY ANGIOGRAPHY Left 02/09/2007   Procedure: LEFT HEART CATH AND CORONARY ANGIOGRAPHY; Location: [facility]   TONSILLECTOMY AND ADENOIDECTOMY Bilateral    TOTAL ABDOMINAL HYSTERECTOMY W/ BILATERAL SALPINGOOPHORECTOMY N/A    TOTAL KNEE ARTHROPLASTY Left 04/26/2014   TUBAL LIGATION     Social History:   reports that she has never smoked. She has been exposed to tobacco smoke. She has never used smokeless tobacco. She reports that she does not currently use alcohol. She reports that she does not use drugs.  Family History  Adopted: Yes  Problem Relation Age of Onset   Heart disease Sister    Stroke Brother     Medications: Patient's Medications  New Prescriptions   No medications on file  Previous Medications   ACETAMINOPHEN  (TYLENOL ) 500 MG TABLET    Take 1,000 mg by mouth 2 (two) times daily as needed.   ALLOPURINOL  (ZYLOPRIM ) 100 MG TABLET    TAKE 1/2 TABLET BY MOUTH ONCE DAILY   CHOLECALCIFEROL  (VITAMIN D ) 50 MCG (2000 UT) TABLET    Take 2,000 Units by mouth daily.    CYANOCOBALAMIN  (B-12) 1000 MCG TABS    TAKE ONE TABLET BY MOUTH ONCE DAILY   EPINEPHRINE  0.3 MG/0.3 ML IJ SOAJ INJECTION    Inject 0.3 mg into the muscle as needed for anaphylaxis.   FLUTICASONE  (FLONASE ) 50 MCG/ACT NASAL SPRAY    Brittney 2 sprays into both nostrils as needed.   FUROSEMIDE  (LASIX ) 20 MG TABLET    Take 0.5 tablets (10 mg total) by mouth daily as needed (shortness of breath, or swelling, or daily weight gain greater than 3 pounds).   METOPROLOL  SUCCINATE (TOPROL -XL) 25 MG 24 HR TABLET    TAKE 1 TABLET BY MOUTH DAILY   OLOPATADINE  (PATANOL) 0.1 % OPHTHALMIC SOLUTION    Brittney 1 drop into both eyes 2 (two) times daily as needed for allergies.   XARELTO  15 MG TABS TABLET    TAKE ONE TABLET BY MOUTH EVERY DAY WITH SUPPER  Modified  Medications   No medications on file  Discontinued Medications   No medications on file    Physical Exam:  Vitals:   08/08/24 1014  BP: 138/78  Pulse: 85  Temp: 98 F (36.7 C)  SpO2: 92%  Weight: 149 lb (67.6 kg)  Height: 4' 11 (1.499 m)   Body mass index is 30.09 kg/m. Wt Readings from Last 3 Encounters:  08/08/24 149 lb (67.6 kg)  08/02/24 150 lb 3.2 oz (68.1 kg)  07/04/24 149 lb (67.6 kg)    Physical Exam Constitutional:      General: She is not in acute distress.    Appearance: She is well-developed. She is not diaphoretic.  HENT:     Head: Normocephalic and atraumatic.     Mouth/Throat:     Pharynx: No oropharyngeal exudate.  Eyes:     Conjunctiva/sclera: Conjunctivae normal.     Pupils: Pupils are equal, round, and reactive to light.  Cardiovascular:     Rate and Rhythm: Normal rate and regular rhythm.     Heart sounds: Normal heart sounds.  Pulmonary:     Effort: Pulmonary effort is normal.     Breath sounds: Normal breath sounds.  Abdominal:     General: Bowel sounds are normal.     Palpations: Abdomen is soft.  Musculoskeletal:     Cervical back: Normal range of motion and neck supple.     Right lower leg: No edema.      Left lower leg: No edema.  Skin:    General: Skin is warm and dry.  Neurological:     Mental Status: She is alert.  Psychiatric:        Mood and Affect: Mood normal.     Labs reviewed: Basic Metabolic Panel: Recent Labs    03/06/24 1151 05/07/24 1006 05/07/24 1106 05/08/24 0436  NA 141 140  --  140  K 5.2 4.2  --  3.7  CL 106 108  --  99  CO2 19* 22  --  27  GLUCOSE 81 110*  --  100*  BUN 32 31*  --  31*  CREATININE 1.60* 1.23*  --  1.56*  CALCIUM  10.0 9.5  --  9.8  MG  --   --  2.0 2.0  TSH  --   --   --  3.433   Liver Function Tests: Recent Labs    01/03/24 0729 05/07/24 1106  AST 18 21  ALT 7 7  ALKPHOS  --  55  BILITOT 0.9 1.4*  PROT 6.2 6.4*  ALBUMIN  --  3.8   Recent Labs    05/07/24 1106  LIPASE 31   No results for input(s): AMMONIA in the last 8760 hours. CBC: Recent Labs    01/03/24 0729 03/06/24 1151 04/10/24 0000 05/07/24 1006 05/08/24 0436  WBC 6.9   < > 6.4 7.5 7.6  NEUTROABS 4,057  --  4,288  --   --   HGB 9.6*   < > 9.5* 9.5* 9.9*  HCT 30.3*   < > 31.0* 31.1* 31.9*  MCV 92.4   < > 91.4 93.4 91.4  PLT 437*   < > 533* 473* 480*   < > = values in this interval not displayed.   Lipid Panel: Recent Labs    05/08/24 0436  CHOL 152  HDL 58  LDLCALC 82  TRIG 60  CHOLHDL 2.6   TSH: Recent Labs    05/08/24 0436  TSH 3.433   A1C:  Lab Results  Component Value Date   HGBA1C 5.4 05/08/2024     Assessment/Plan Assessment and Plan Assessment & Plan Metacarpal fracture of right hand Fracture of the metacarpal bone in the right hand. Swelling present, minimal pain. Using immobilizer brace. - Continue using the immobilizer brace as instructed and keep follow up   Anemia in stage 3b chronic kidney disease Anemia secondary to stage 3b chronic kidney disease. Hematologist and nephrologist involved. Scheduled for comprehensive blood work in January. - Continue oral iron supplementation.  Permanent atrial  fibrillation Likely contributing  to shortness of breath. No palpitations. Managed with metoprolol  and Xarelto . - Continue metoprolol  25 mg daily. - Continue Xarelto  for anticoagulation.  Diastolic dysfunction with chronic heart failure (HCC) Stable, euvolemic, has PRN lasix    Gout, unspecified cause, unspecified chronicity, unspecified site Stable, continues allopurinol    Next appt: 11/21/2024 Harlene [name]. Brittney BODILY  [facility] [phone]     [1]  Allergies Allergen Reactions   Lisinopril Swelling   Mixed Grasses Anaphylaxis   Bee Venom Swelling   Lactose Other (See Comments)    LACTOSE INTOLERANCE   Azithromycin Diarrhea   Baclofen Diarrhea   Iodine     CKD   Morphine  And Codeine Nausea And Vomiting   [place] Clam (M. Mercenaria) Skin Test Diarrhea    Mussels.   Oyster Extract Diarrhea    Mussels.   Sulfa Antibiotics Rash

## 2024-09-04 NOTE — Progress Notes (Unsigned)
 "  Cardiology Office Note    Date:  09/07/2024   ID:  Brittney [name], DOB October 05, 1929, MRN [id number]  PCP:  [name], NP  Cardiologist:  Redell Cave, MD  Electrophysiologist:  None   Chief Complaint: Follow up  History of Present Illness:   Brittney [name] is a 89 y.o. female with history of permanent A-fib, HFpEF, TIA, CKD stage IIIb with anemia of chronic disease, HTN, HLD, and lumbar radiculitis who presents for follow-up of HFpEF and A-fib.   She was previously followed by [facility], and underwent cardiac cath in 2008 which showed no angiographically apparent CAD in the major epicardial arteries.  Calcium  score in 2013 of 129.73.  Nuclear stress test in 2015 showed no evidence of ischemia with normal LV function.  She was admitted to the hospital in 2019 with a suspected TIA.  Carotid artery ultrasound at that time showed no hemodynamically significant stenosis in the bilateral carotid arteries with antegrade flow of the bilateral vertebral arteries.  Echo demonstrated an EF of 45 to 50% with hypokinesis of the inferior myocardium and a mildly dilated left atrium.  Notes indicate she was diagnosed with A-fib in 10/2020, and initiated on apixaban .  She established care with our group in 2022.  Subsequent echo in 07/2021 showed a stable LV systolic function with an EF of 45 to 50%, indeterminate LV diastolic function parameters, normal RV systolic function and ventricular cavity size, severely dilated left atrium, mildly dilated right atrium, mild to moderate mitral regurgitation, mild to moderate tricuspid regurgitation, and an estimated right atrial pressure of 8 mmHg.  She was seen in our office in 08/2021 and reported a possible TIA several weeks prior.  She was initiated on aspirin  with continuation of apixaban  and referred to neurology with recommendation to be maintained on apixaban  and aspirin .  MRI of the brain showed no evidence of acute intracranial abnormality with  mild to moderate chronic small vessel ischemic changes that had progressed from study in 2019 as well as mild generalized cerebral atrophy.  She was seen in the office in 08/2022 noting a several month history of exertional shortness of breath when walking up an incline without frank chest pain.  Symptoms were largely unchanged when compared to her prior visit and would last for a couple of seconds with spontaneous resolution.  She also wondered if rosuvastatin  was contributing to some of her lower extremity discomfort.  In this setting, she underwent echo in 09/2022 which showed an EF of 60 to 65%, no regional wall motion abnormalities, mild LVH, grade 2 diastolic dysfunction, normal RV systolic function and ventricular cavity size, mildly elevated RVSP at 38.8 mmHg, moderately dilated left atrium, mild to moderate mitral regurgitation, mild to moderate tricuspid regurgitation, mild aortic insufficiency, and an estimated right atrial pressure of 3 mmHg.   She was evaluated at her PCP's office on 04/08/2023 noting an increase in fatigue and dyspnea with associated chest discomfort.  Reported ankle swelling at the end of the day.  Chest x-ray was obtained which demonstrated vascular congestion.  In this setting, primary cardiologist initiated the patient on furosemide  20 mg daily.  However, patient subsequently notified our office that she was experiencing dizziness with initiation of furosemide .  With this, furosemide  was held on 04/28/2023.   She was seen in the office on 04/29/2023 noting an increase in dizziness following initiation of furosemide .  She felt like her breathing was overall stable with continued shortness of breath and chest heaviness with ambulation  that was longstanding.  There was no significant lower extremity swelling.  Toprol  was reduced to 12.5 mg daily with recommendation to continue to hold furosemide .  To further evaluate her longstanding exertional dyspnea, she underwent Lexiscan  MPI on  05/07/2023 that showed no evidence of ischemia or infarction with normal LV systolic function and was overall low risk.  CT attenuated corrected images demonstrated coronary artery calcification and aortic atherosclerosis.   She was seen in the office on 06/30/2023 noting unchanged chronic dyspnea.  Orthostatic dizziness improved following reduction of Toprol -XL and discontinuation of scheduled furosemide .   She was seen in the ED on 09/03/2023 after sustaining a mechanical fall, attempting to sit down at her kitchen [place] when the chair slid out from underneath her.  CT head showed no acute intracranial process.  CT cervical spine showed no acute fracture or traumatic finding.   She was seen in the office in 08/2023 and felt like her chronic dyspnea was a little improved.  She was taking furosemide  10 mg once daily dating back to November 2024.  Off rosuvastatin , she noted some improvement in myalgias, though not resolution.  Chest x-ray in 12/2023 showed suspected atelectasis and possible minimal pleural effusions.  CBC obtained at that time showed a downtrending hemoglobin of 9.6.  She was seen in our office in 02/2024 reporting stable chronic dyspnea.  She self discontinued furosemide  in 12/2023, reporting she felt like this was contributing to abdominal discomfort.  She was evaluated by pulmonology for chronic dyspnea on 02/2024 with high-resolution chest CT in 03/2024 showing small airway disease, small bilateral pleural effusions, possibly loculated, moderate paraesophageal hernia with intrathoracic position of the gastric body and fundus, compressive atelectasis of the left lung bases secondary to hernia sac.   She was admitted to the hospital in 04/2024 with chest and back pain with deep inspiration and exertional dyspnea.  BNP 712.  High-sensitivity troponin negative.  Hemoglobin 9.5.  She declined CTA chest.  Noncontrast chest CT showed lower lobe peribronchial thickening, bilateral pleural effusions, and  ground glass attenuation.  Echo during the admission showed an EF of 55 to 60%, no regional wall motion abnormalities, mild LVH, grade 2 diastolic dysfunction, normal RV systolic function and ventricular cavity size, moderately dilated left atrium, moderate mitral regurgitation with moderate mitral annular calcification, mild aortic insufficiency, and aortic valve sclerosis without evidence of stenosis.   She followed up with pulmonology in late 04/2024 feeling much better following hospitalization.  Point-of-care ultrasound showed only a small loculated pleural effusion on the left side.  It was felt the patient's symptoms were largely being driven by HFpEF compounded by moderate mitral regurgitation and in the setting of her hiatal hernia.  She was last seen in our office in 05/2024 and was doing very well from a cardiac perspective, noting significant improvement in her breathing and had not taken any as needed furosemide .  It was felt her dyspnea was multifactorial including HFpEF and moderate mitral regurgitation which were exacerbated by anemia of chronic disease, as well as moderate hiatal hernia.  Addition of SGLT2 inhibitor has been deferred due to urinary frequency/urgency.  She comes in today reporting chronic exertional dyspnea with minimal activity such as ambulating around the house from room to room.  No frank chest pain.  No dizziness, presyncope, or syncope.  No lower extremity swelling, abdominal distention, orthopnea, or early satiety.  Weight has been stable at home.  She last took a 10 mg of furosemide  approximately 1 month ago.  No  cough.  No hematochezia, melena, hemoptysis, emesis, or hematuria.  Continues to note generalized fatigue.  Does try and remain active.   Labs independently reviewed: 08/2024 - BUN 20, serum creatinine 1.34, potassium 4.3, albumin 4.3, AST/ALT normal, Hgb 10.1, PLT 474 04/2024 - A1c 5.4, TSH normal, TC 152, TG 60, HDL 58, LDL 82, magnesium  2.0  Past Medical  History:  Diagnosis Date   Adopted person    Allergy    Anemia    Angina pectoris    Aortic atherosclerosis    Arthritis    Atrial fibrillation (HCC)    a. ) CHA2DS2-VASc = 7 (age x 2, sex, HTN, TIA x2, aortic plaque);  b.) rate/rhythm maintained on oral metoprolol  succinate; chronically anticoagulated using dose reduced apixaban .   Bilateral carotid artery disease    CAD (coronary artery disease)    a.) LHC 02/09/2007: EF 60%, LVEDP 25 mmHg, normal cors; b.) cCTA 11/27/2011: Ca score 129.73 --> 1.17 RCA, 16.36 LM, 112.21 LAD, 0 LCx; c.) MPI 03/13/2014 - normal.   Cataract    CKD (chronic kidney disease), stage IV (HCC)    Diastolic dysfunction 02/09/2008   a.) LHC 02/09/2008: EF 60%, LVEDP 25 mmHg; b.) TTE 08/07/2010: EF 65%, mild LVH, mild MR, G2DD; b.) TTE 08/20/2018: EF 45-50%, inferior HK, mild LAE; c.) TTE 08/11/2021: EF 45-50%. 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   Left carotid bruit    Long term current use of anticoagulant    a.) apixaban    Lumbar stenosis    Lymphocytic colitis    Osteoporosis    TIA (transient ischemic attack) 07/2018   TIA (transient ischemic attack) 08/2021   Vitamin B12 deficiency    Wears hearing BILATERAL aids    Wears partial (bottom) dentures     Past Surgical History:  Procedure Laterality Date   BREAST EXCISIONAL BIOPSY Right 36 years ago   neg   BUNIONECTOMY Bilateral    COLONOSCOPY N/A 07/02/2008   KNEE ARTHROPLASTY Right 03/11/2022   Procedure: COMPUTER ASSISTED TOTAL KNEE ARTHROPLASTY;  Surgeon: Mardee Lynwood SQUIBB, MD;  Location: [facility] ORS;  Service: Orthopedics;  Laterality: Right;   KNEE ARTHROSCOPY Right 2006   LEFT HEART CATH AND CORONARY ANGIOGRAPHY Left 02/09/2007   Procedure: LEFT HEART CATH AND CORONARY ANGIOGRAPHY; Location: [facility]    TONSILLECTOMY AND ADENOIDECTOMY Bilateral    TOTAL ABDOMINAL HYSTERECTOMY W/ BILATERAL SALPINGOOPHORECTOMY N/A    TOTAL KNEE ARTHROPLASTY Left 04/26/2014   TUBAL LIGATION      Current Medications: Active Medications[1]  Allergies:   Lisinopril, Mixed grasses, Bee venom, Lactose, Azithromycin, Baclofen, Iodine, Morphine  and codeine, M. mercenaria, Oyster extract, and Sulfa antibiotics   Social History   Socioeconomic History   Marital status: Widowed    Spouse name: Not on file   Number of children: Not on file   Years of education: Not on file   Highest education level: Not on file  Occupational History   Not on file  Tobacco Use   Smoking status: Never    Passive exposure: Past (52yrs of exporsure from parents growing up)   Smokeless tobacco: Never  Vaping Use   Vaping status: Never Used  Substance and Sexual Activity   Alcohol use: Not Currently   Drug use: Never   Sexual activity: Not Currently  Other Topics Concern   Not on file  Social History Narrative   Not on file   Social Drivers of Health   Tobacco Use: Low Risk (09/07/2024)   Patient History    Smoking Tobacco Use: Never    Smokeless Tobacco Use: Never    Passive Exposure: Past  Financial Resource Strain: Low Risk  (03/14/2024)   Received from [facility]   Overall Financial Resource Strain (CARDIA)    Difficulty of Paying Living Expenses: Not hard at all  Food Insecurity: No Food Insecurity (07/04/2024)   Epic    Worried About Radiation Protection Practitioner of Food in the Last Year: Never true    Ran Out of Food in the Last Year: Never true  Transportation Needs: No Transportation Needs (07/04/2024)   Epic    Lack of Transportation (Medical): No    Lack of Transportation (Non-Medical): No  Physical Activity: Not on file  Stress: Not on file  Social Connections: Not on file  Depression (PHQ2-9): Low Risk (08/08/2024)   Depression (PHQ2-9)    PHQ-2 Score: 0  Alcohol Screen: Not on file  Housing:  Low Risk (07/04/2024)   Epic    Unable to Pay for Housing in the Last Year: No    Number of Times Moved in the Last Year: 0    Homeless in the Last Year: No  Utilities: Not At Risk (07/04/2024)   Epic    Threatened with loss of utilities: No  Health Literacy: Not on file     Family History:  The patient's family history includes Heart disease in her sister; Stroke in her brother. She was adopted.  ROS:   12-point review of systems is negative unless otherwise noted in the HPI.   EKGs/Labs/Other Studies Reviewed:    Studies reviewed were summarized above. The additional studies were reviewed today:  2D echo 05/08/2024: 1. Left ventricular ejection fraction, by estimation, is 55 to 60%. The  left ventricle has normal function. The left ventricle has no regional  wall motion abnormalities. There is mild left ventricular hypertrophy.  Left ventricular diastolic parameters  are consistent with Grade II diastolic dysfunction (pseudonormalization).   2. Right ventricular systolic function is normal. The right ventricular  size is normal. Tricuspid regurgitation signal is inadequate for assessing  PA pressure.   3. Left atrial size was moderately dilated.   4. The mitral valve is normal in structure. Moderate mitral valve  regurgitation. No evidence of mitral stenosis. Moderate mitral annular  calcification.   5. The aortic valve is normal in structure. Aortic valve regurgitation is  mild. Aortic valve sclerosis/calcification is present, without any  evidence of aortic stenosis.   6. The inferior vena cava is normal in size with <50% respiratory  variability, suggesting right atrial pressure of 8 mmHg.  __________   Lexiscan  MPI 05/07/2023:   The study is normal. The study is low risk.   No ST deviation was noted.   LV perfusion is normal. There is no evidence of ischemia. There is no evidence of infarction.   Left ventricular function is normal. End diastolic cavity size is  normal. End systolic cavity size is normal.   CT attenuation images show evidence of aortic and coronary calcifications. __________   2D echo 10/02/2022: 1. Left ventricular ejection fraction, by estimation, is 60 to 65%. The  left ventricle has normal function. The left ventricle has no regional  wall motion abnormalities. There is mild left ventricular hypertrophy.  Left ventricular diastolic  parameters  are consistent with Grade II diastolic dysfunction (pseudonormalization).  The average left ventricular global longitudinal strain is -8.0 %. The  global longitudinal strain is abnormal.   2. Right ventricular systolic function is normal. The right ventricular  size is normal. There is mildly elevated pulmonary artery systolic  pressure. The estimated right ventricular systolic pressure is 38.8 mmHg.   3. Left atrial size was moderately dilated.   4. The mitral valve is normal in structure. Mild to moderate mitral valve  regurgitation. No evidence of mitral stenosis.   5. Tricuspid valve regurgitation is mild to moderate.   6. The aortic valve is tricuspid. Aortic valve regurgitation is mild. No  aortic stenosis is present.   7. The inferior vena cava is normal in size with greater than 50%  respiratory variability, suggesting right atrial pressure of 3 mmHg.  __________   2D echo 08/11/2021: 1. Left ventricular ejection fraction, by estimation, is 45 to 50%. The  left ventricle has mildly decreased function. The left ventricle has no  regional wall motion abnormalities. Left ventricular diastolic parameters  are indeterminate.   2. Right ventricular systolic function is normal. The right ventricular  size is normal.   3. Left atrial size was severely dilated.   4. Right atrial size was mildly dilated.   5. The mitral valve is normal in structure. Mild to moderate mitral valve  regurgitation.   6. Tricuspid valve regurgitation is mild to moderate.   7. The aortic valve is  tricuspid. Aortic valve regurgitation is not  visualized.   8. The inferior vena cava is normal in size with <50% respiratory  variability, suggesting right atrial pressure of 8 mmHg.  __________   2D echo 08/20/2018: - Left ventricle: The cavity size was mildly dilated. Wall    thickness was normal. Systolic function was mildly reduced. The    estimated ejection fraction was in the range of 45% to 50%.    Hypokinesis of the inferior myocardium.  - Left atrium: The atrium was mildly dilated.   Impressions:   - Mildly reduced LVF    Inferior Hypokinesis    EF=45-50%    Normal Right side. No cardiac source of emboli was indentified.  __________   Carotid artery ultrasound 08/19/2018: IMPRESSION: No hemodynamically significant stenosis is noted in either cervical carotid artery.   EKG:  EKG is ordered today.  The EKG ordered today demonstrates A-fib, 66 bpm, LVH, incomplete RBBB  Recent Labs: 05/07/2024: B Natriuretic Peptide 712.1 05/08/2024: Magnesium  2.0; TSH 3.433 09/05/2024: ALT 7; BUN 28; Creatinine 1.34; Hemoglobin 10.1; Platelet Count 474; Potassium 4.3; Sodium 142  Recent Lipid Panel    Component Value Date/Time   CHOL 152 05/08/2024 0436   TRIG 60 05/08/2024 0436   HDL 58 05/08/2024 0436   CHOLHDL 2.6 05/08/2024 0436   VLDL 12 05/08/2024 0436   LDLCALC 82 05/08/2024 0436   LDLCALC 90 04/12/2023 0735   LDLDIRECT 58 09/04/2022 1129    PHYSICAL EXAM:    VS:  BP (!) 150/80 (BP Location: Left Arm, Patient Position: Sitting, Cuff Size: Normal)   Pulse 66 Comment: 73 oximeter  Ht 4' 11 (1.499 m)   Wt 146 lb 12.8 oz (66.6 kg)   SpO2 97%   BMI 29.65 kg/m   BMI: Body mass index is 29.65 kg/m.  Physical Exam Vitals reviewed.  Constitutional:      Appearance: She is well-developed.  HENT:     Head: Normocephalic and atraumatic.  Eyes:  General:        Right eye: No discharge.        Left eye: No discharge.  Cardiovascular:     Rate and Rhythm: Normal  rate. Rhythm irregularly irregular.     Pulses:          Posterior tibial pulses are 2+ on the right side and 2+ on the left side.     Heart sounds: S1 normal and S2 normal. Heart sounds not distant. No midsystolic click and no opening snap. Murmur heard.     Systolic murmur is present with a grade of 1/6 at the upper left sternal border.     No friction rub.  Pulmonary:     Effort: Pulmonary effort is normal. No respiratory distress.     Breath sounds: Examination of the right-lower field reveals decreased breath sounds. Examination of the left-lower field reveals decreased breath sounds. Decreased breath sounds present. No wheezing, rhonchi or rales.     Comments: Mildly diminished breath sounds along the bilateral bases. Musculoskeletal:     Cervical back: Normal range of motion.     Right lower leg: No edema.     Left lower leg: No edema.  Skin:    General: Skin is warm and dry.     Nails: There is no clubbing.  Neurological:     Mental Status: She is alert and oriented to person, place, and time.  Psychiatric:        Speech: Speech normal.        Behavior: Behavior normal.        Thought Content: Thought content normal.        Judgment: Judgment normal.     Wt Readings from Last 3 Encounters:  09/07/24 146 lb 12.8 oz (66.6 kg)  09/05/24 148 lb (67.1 kg)  08/08/24 149 lb (67.6 kg)     ASSESSMENT & PLAN:   Permanent A-fib: Well-controlled ventricular rates on Toprol -XL 25 mg.  CHA2DS2-VASc at least 7 (HTN, age x 2, TIA x 2, vascular disease, sex category).  She remains on rivaroxaban  15 mg daily with a creatinine clearance of 27.7.  Recent CBC demonstrated stable to improved hemoglobin  HFpEF with chronic dyspnea: She feels like her dyspnea has gotten worse when compared to her last visit in 05/2024.  Suspect her dyspnea is multifactorial including HFpEF and moderate mitral regurgitation which are exacerbated by anemia of chronic disease, as well as known moderate hiatal  hernia.  Weight stable.  Check BNP.  She will take furosemide  10 mg daily for the next week.  She has not previously tolerated higher doses of furosemide  secondary to orthostasis.  If chronic dyspnea persists may need to consider ischemic evaluation as well as RHC to better understand hemodynamic status, this would also allow for further evaluation of mitral regurgitation.  Not currently on MRA given underlying CKD.  Consider addition of SGLT2 inhibitor in follow-up.  HTN: Blood pressure is mildly elevated in the office today.  She remains on Toprol -XL 25 mg.  Diuresis as above.  HLD with statin intolerance: LDL 82 in 04/2024.  No longer on statin therapy secondary to myalgia.  Mitral regurgitation/aortic insufficiency: Echo in 04/2024 demonstrated moderate mitral regurgitation with moderate mitral annular calcification along with mild aortic insufficiency.  Monitor with periodic echo.  History of TIA: No new deficits.  Remains on renally dosed rivaroxaban .  CKD stage IIIb with normocytic anemia: Renal disease followed by nephrology.  Symptomatic anemia followed by hematology and remains on  iron supplementation.     Disposition: F/u with Dr. Darliss or an APP in 1-2 weeks.   Medication Adjustments/Labs and Tests Ordered: Current medicines are reviewed at length with the patient today.  Concerns regarding medicines are outlined above. Medication changes, Labs and Tests ordered today are summarized above and listed in the Patient Instructions accessible in Encounters.   SignedBernardino Bring, PA-C 09/07/2024 12:45 PM      [facility] [address] [place], [place] [address] [phone]     [1]  Current Meds  Medication Sig   acetaminophen  (TYLENOL ) 500 MG tablet Take 1,000 mg by mouth 2 (two) times daily as needed.   allopurinol  (ZYLOPRIM ) 100 MG tablet TAKE 1/2 TABLET BY MOUTH ONCE DAILY   Cholecalciferol  (VITAMIN D ) 50 MCG (2000 UT) tablet Take  2,000 Units by mouth daily.   Cyanocobalamin  (B-12) 1000 MCG TABS TAKE ONE TABLET BY MOUTH ONCE DAILY   EPINEPHrine  0.3 mg/0.3 mL IJ SOAJ injection Inject 0.3 mg into the muscle as needed for anaphylaxis.   fluticasone  (FLONASE ) 50 MCG/ACT nasal spray Place 2 sprays into both nostrils as needed.   metoprolol  succinate (TOPROL -XL) 25 MG 24 hr tablet TAKE 1 TABLET BY MOUTH DAILY   olopatadine  (PATANOL) 0.1 % ophthalmic solution Place 1 drop into both eyes 2 (two) times daily as needed for allergies.   XARELTO  15 MG TABS tablet TAKE ONE TABLET BY MOUTH EVERY DAY WITH SUPPER   [DISCONTINUED] furosemide  (LASIX ) 20 MG tablet Take 0.5 tablets (10 mg total) by mouth daily as needed (shortness of breath, or swelling, or daily weight gain greater than 3 pounds).   "

## 2024-09-05 ENCOUNTER — Inpatient Hospital Stay: Attending: Internal Medicine

## 2024-09-05 ENCOUNTER — Inpatient Hospital Stay: Admitting: Internal Medicine

## 2024-09-05 ENCOUNTER — Inpatient Hospital Stay

## 2024-09-05 ENCOUNTER — Encounter: Payer: Self-pay | Admitting: Internal Medicine

## 2024-09-05 VITALS — BP 157/81 | HR 76 | Temp 98.2°F | Resp 20 | Ht 59.0 in | Wt 148.0 lb

## 2024-09-05 DIAGNOSIS — I129 Hypertensive chronic kidney disease with stage 1 through stage 4 chronic kidney disease, or unspecified chronic kidney disease: Secondary | ICD-10-CM | POA: Insufficient documentation

## 2024-09-05 DIAGNOSIS — D649 Anemia, unspecified: Secondary | ICD-10-CM | POA: Diagnosis not present

## 2024-09-05 DIAGNOSIS — I509 Heart failure, unspecified: Secondary | ICD-10-CM | POA: Insufficient documentation

## 2024-09-05 LAB — CBC WITH DIFFERENTIAL (CANCER CENTER ONLY)
Abs Immature Granulocytes: 0.03 K/uL (ref 0.00–0.07)
Basophils Absolute: 0.1 K/uL (ref 0.0–0.1)
Basophils Relative: 2 %
Eosinophils Absolute: 0 K/uL (ref 0.0–0.5)
Eosinophils Relative: 0 %
HCT: 33.4 % — ABNORMAL LOW (ref 36.0–46.0)
Hemoglobin: 10.1 g/dL — ABNORMAL LOW (ref 12.0–15.0)
Immature Granulocytes: 1 %
Lymphocytes Relative: 13 %
Lymphs Abs: 0.7 K/uL (ref 0.7–4.0)
MCH: 29.4 pg (ref 26.0–34.0)
MCHC: 30.2 g/dL (ref 30.0–36.0)
MCV: 97.1 fL (ref 80.0–100.0)
Monocytes Absolute: 0.5 K/uL (ref 0.1–1.0)
Monocytes Relative: 8 %
Neutro Abs: 4.5 K/uL (ref 1.7–7.7)
Neutrophils Relative %: 76 %
Platelet Count: 474 K/uL — ABNORMAL HIGH (ref 150–400)
RBC: 3.44 MIL/uL — ABNORMAL LOW (ref 3.87–5.11)
RDW: 17.3 % — ABNORMAL HIGH (ref 11.5–15.5)
WBC Count: 5.9 K/uL (ref 4.0–10.5)
nRBC: 0 % (ref 0.0–0.2)

## 2024-09-05 LAB — IRON AND TIBC
Iron: 81 ug/dL (ref 28–170)
Saturation Ratios: 19 % (ref 10.4–31.8)
TIBC: 419 ug/dL (ref 250–450)
UIBC: 338 ug/dL

## 2024-09-05 LAB — CMP (CANCER CENTER ONLY)
ALT: 7 U/L (ref 0–44)
AST: 20 U/L (ref 15–41)
Albumin: 4.3 g/dL (ref 3.5–5.0)
Alkaline Phosphatase: 74 U/L (ref 38–126)
Anion gap: 11 (ref 5–15)
BUN: 28 mg/dL — ABNORMAL HIGH (ref 8–23)
CO2: 23 mmol/L (ref 22–32)
Calcium: 10 mg/dL (ref 8.9–10.3)
Chloride: 109 mmol/L (ref 98–111)
Creatinine: 1.34 mg/dL — ABNORMAL HIGH (ref 0.44–1.00)
GFR, Estimated: 37 mL/min — ABNORMAL LOW
Glucose, Bld: 94 mg/dL (ref 70–99)
Potassium: 4.3 mmol/L (ref 3.5–5.1)
Sodium: 142 mmol/L (ref 135–145)
Total Bilirubin: 0.9 mg/dL (ref 0.0–1.2)
Total Protein: 6.7 g/dL (ref 6.5–8.1)

## 2024-09-05 LAB — RETIC PANEL
Immature Retic Fract: 20.8 % — ABNORMAL HIGH (ref 2.3–15.9)
RBC.: 3.48 MIL/uL — ABNORMAL LOW (ref 3.87–5.11)
Retic Count, Absolute: 63.7 K/uL (ref 19.0–186.0)
Retic Ct Pct: 1.8 % (ref 0.4–3.1)
Reticulocyte Hemoglobin: 32.1 pg

## 2024-09-05 LAB — FERRITIN: Ferritin: 40 ng/mL (ref 11–307)

## 2024-09-05 NOTE — Progress Notes (Signed)
 [name] CONSULT NOTE  Patient Care Team: Brittney Harlene POUR, NP as PCP - General (Geriatric Medicine) [name], MD as PCP - Cardiology (Cardiology) [name], [place] (Optometry) Rennie Cindy SAUNDERS, MD as Consulting Physician (Oncology)  CHIEF COMPLAINTS/PURPOSE OF CONSULTATION: ANEMIA   HEMATOLOGY HISTORY  # ANEMIA[Hb; MCV-platelets- WBC; Iron sat; ferritin;  GFR- CT/US ; EGD/colonoscopy-  # CKD- [Dr.]  HISTORY OF PRESENTING ILLNESS: Patient ambulating-independently. Brittney [name] 89 y.o.  female pleasant patient with a history of CKD [Dr.K] and anemia; A-fib-on Xarelto  is here for a follow up of anemia.  Discussed the use of AI scribe software for clinical note transcription with the patient, who gave verbal consent to proceed.  History of Present Illness   Brittney [name] is a 89 year old female with chronic anemia secondary to chronic kidney disease and atrial fibrillation on Xarelto  who presents for hematology follow-up of anemia.  She has chronic anemia, with hemoglobin previously as low as 9.5 g/dL, now improved to 89.8 g/dL. She has been taking oral gentle iron, one pill daily for approximately two months, without gastrointestinal side effects. She has not required iron infusions or injections. Extensive blood work to evaluate for underlying causes, including malignancy, has been performed and results are pending.  She has chronic kidney disease for approximately twelve years, managed with dietary modifications and without dialysis. She continues Xarelto  for atrial fibrillation.  She experiences exertional dyspnea, describing trouble breathing when walking. She also has a hiatal hernia, which she believes may contribute to her symptoms. She denies gastrointestinal side effects from iron supplementation.      Review of Systems  Constitutional:  Positive for malaise/fatigue. Negative for chills,  diaphoresis, fever and weight loss.  HENT:  Negative for nosebleeds and sore throat.   Eyes:  Negative for double vision.  Respiratory:  Negative for cough, hemoptysis, sputum production, shortness of breath and wheezing.   Cardiovascular:  Negative for chest pain, palpitations, orthopnea and leg swelling.  Gastrointestinal:  Negative for abdominal pain, blood in stool, constipation, diarrhea, heartburn, melena, nausea and vomiting.  Genitourinary:  Negative for dysuria, frequency and urgency.  Musculoskeletal:  Positive for back pain and joint pain.  Skin: Negative.  Negative for itching and rash.  Neurological:  Negative for dizziness, tingling, focal weakness, weakness and headaches.  Endo/Heme/Allergies:  Does not bruise/bleed easily.  Psychiatric/Behavioral:  Negative for depression. The patient is not nervous/anxious and does not have insomnia.      MEDICAL HISTORY:  Past Medical History:  Diagnosis Date   Adopted person    Allergy    Anemia    Angina pectoris    Aortic atherosclerosis    Arthritis    Atrial fibrillation (HCC)    a. ) CHA2DS2-VASc = 7 (age x 2, sex, HTN, TIA x2, aortic plaque);  b.) rate/rhythm maintained on oral metoprolol  succinate; chronically anticoagulated using dose reduced apixaban .   Bilateral carotid artery disease    CAD (coronary artery disease)    a.) LHC 02/09/2007: EF 60%, LVEDP 25 mmHg, normal cors; b.) cCTA 11/27/2011: Ca score 129.73 --> 1.17 RCA, 16.36 LM, 112.21 LAD, 0 LCx; c.) MPI 03/13/2014 - normal.   Cataract    CKD (chronic kidney disease), stage IV (HCC)    Diastolic dysfunction 02/09/2008   a.) LHC 02/09/2008: EF 60%, LVEDP 25 mmHg; b.) TTE 08/07/2010: EF 65%, mild LVH, mild MR, G2DD; b.) TTE 08/20/2018: EF 45-50%, inferior HK, mild LAE; c.) TTE 08/11/2021: EF 45-50%. 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   Left carotid bruit    Long term current use of anticoagulant    a.) apixaban    Lumbar stenosis    Lymphocytic colitis    Osteoporosis    TIA (transient ischemic attack) 07/2018   TIA (transient ischemic attack) 08/2021   Vitamin B12 deficiency    Wears hearing BILATERAL aids    Wears partial (bottom) dentures     SURGICAL HISTORY: Past Surgical History:  Procedure Laterality Date   BREAST EXCISIONAL BIOPSY Right 36 years ago   neg   BUNIONECTOMY Bilateral    COLONOSCOPY N/A 07/02/2008   KNEE ARTHROPLASTY Right 03/11/2022   Procedure: COMPUTER ASSISTED TOTAL KNEE ARTHROPLASTY;  Surgeon: Mardee Lynwood SQUIBB, MD;  Location: [facility] ORS;  Service: Orthopedics;  Laterality: Right;   KNEE ARTHROSCOPY Right 2006   LEFT HEART CATH AND CORONARY ANGIOGRAPHY Left 02/09/2007   Procedure: LEFT HEART CATH AND CORONARY ANGIOGRAPHY; Location: [facility]   TONSILLECTOMY AND ADENOIDECTOMY Bilateral    TOTAL ABDOMINAL HYSTERECTOMY W/ BILATERAL SALPINGOOPHORECTOMY N/A    TOTAL KNEE ARTHROPLASTY Left 04/26/2014   TUBAL LIGATION      SOCIAL HISTORY: Social History   Socioeconomic History   Marital status: Widowed    Spouse name: Not on file   Number of children: Not on file   Years of education: Not on file   Highest education level: Not on file  Occupational History   Not on file  Tobacco Use   Smoking status: Never    Passive exposure: Past (33yrs of exporsure from parents growing up)   Smokeless tobacco: Never  Vaping Use   Vaping status: Never Used  Substance and Sexual Activity   Alcohol use: Not Currently   Drug use: Never   Sexual activity: Not Currently  Other Topics Concern   Not on file  Social History Narrative   Not on file   Social Drivers of Health   Tobacco Use: Low Risk (09/05/2024)   Patient History    Smoking Tobacco Use: Never    Smokeless Tobacco Use: Never    Passive Exposure: Past  Financial Resource Strain:  Low Risk  (03/14/2024)   Received from [facility]   Overall Financial Resource Strain (CARDIA)    Difficulty of Paying Living Expenses: Not hard at all  Food Insecurity: No Food Insecurity (07/04/2024)   Epic    Worried About Radiation Protection Practitioner of Food in the Last Year: Never true    Ran Out of Food in the Last Year: Never true  Transportation Needs: No Transportation Needs (07/04/2024)   Epic    Lack of Transportation (Medical): No    Lack of Transportation (Non-Medical): No  Physical Activity: Not on file  Stress: Not on file  Social Connections: Not on file  Intimate Partner Violence: Not At Risk (07/04/2024)   Epic    Fear of Current or Ex-Partner: No    Emotionally Abused: No    Physically Abused: No    Sexually Abused: No  Depression (PHQ2-9): Low Risk (08/08/2024)   Depression (PHQ2-9)    PHQ-2 Score: 0  Alcohol Screen: Not on file  Housing: Low Risk (07/04/2024)   Epic    Unable to Pay for Housing in the Last Year: No  Number of Times Moved in the Last Year: 0    Homeless in the Last Year: No  Utilities: Not At Risk (07/04/2024)   Epic    Threatened with loss of utilities: No  Health Literacy: Not on file    FAMILY HISTORY: Family History  Adopted: Yes  Problem Relation Age of Onset   Heart disease Sister    Stroke Brother     ALLERGIES:  is allergic to lisinopril, mixed grasses, bee venom, lactose, azithromycin, baclofen, iodine, morphine  and codeine, m. mercenaria, oyster extract, and sulfa antibiotics.  MEDICATIONS:  Current Outpatient Medications  Medication Sig Dispense Refill   acetaminophen  (TYLENOL ) 500 MG tablet Take 1,000 mg by mouth 2 (two) times daily as needed.     allopurinol  (ZYLOPRIM ) 100 MG tablet TAKE 1/2 TABLET BY MOUTH ONCE DAILY 15 tablet 6   Cholecalciferol  (VITAMIN D ) 50 MCG (2000 UT) tablet Take 2,000 Units by mouth daily.     Cyanocobalamin  (B-12) 1000 MCG TABS TAKE ONE TABLET BY MOUTH ONCE DAILY 100 tablet 1    EPINEPHrine  0.3 mg/0.3 mL IJ SOAJ injection Inject 0.3 mg into the muscle as needed for anaphylaxis. 1 each 3   fluticasone  (FLONASE ) 50 MCG/ACT nasal spray Place 2 sprays into both nostrils as needed. 16 g 1   furosemide  (LASIX ) 20 MG tablet Take 0.5 tablets (10 mg total) by mouth daily as needed (shortness of breath, or swelling, or daily weight gain greater than 3 pounds). 90 tablet 3   metoprolol  succinate (TOPROL -XL) 25 MG 24 hr tablet TAKE 1 TABLET BY MOUTH DAILY 90 tablet 3   olopatadine  (PATANOL) 0.1 % ophthalmic solution Place 1 drop into both eyes 2 (two) times daily as needed for allergies. 5 mL 2   XARELTO  15 MG TABS tablet TAKE ONE TABLET BY MOUTH EVERY DAY WITH SUPPER 90 tablet 3   No current facility-administered medications for this visit.     SABRA  PHYSICAL EXAMINATION:   Vitals:   09/05/24 1016  BP: (!) 157/81  Pulse: 76  Resp: 20  Temp: 98.2 F (36.8 C)  SpO2: 100%   Filed Weights   09/05/24 1016  Weight: 148 lb (67.1 kg)    Physical Exam Vitals and nursing note reviewed.  HENT:     Head: Normocephalic and atraumatic.     Mouth/Throat:     Pharynx: Oropharynx is clear.  Eyes:     Extraocular Movements: Extraocular movements intact.     Pupils: Pupils are equal, round, and reactive to light.  Cardiovascular:     Rate and Rhythm: Normal rate. Rhythm irregular.  Pulmonary:     Comments: Decreased breath sounds bilaterally.  Abdominal:     Palpations: Abdomen is soft.  Musculoskeletal:        General: Normal range of motion.     Cervical back: Normal range of motion.  Skin:    General: Skin is warm.  Neurological:     General: No focal deficit present.     Mental Status: She is alert and oriented to person, place, and time.  Psychiatric:        Behavior: Behavior normal.        Judgment: Judgment normal.      LABORATORY DATA:  I have reviewed the data as listed Lab Results  Component Value Date   WBC 5.9 09/05/2024   HGB 10.1 (L) 09/05/2024    HCT 33.4 (L) 09/05/2024   MCV 97.1 09/05/2024   PLT 474 (H) 09/05/2024  Recent Labs    01/03/24 0729 03/06/24 1151 05/07/24 1006 05/07/24 1106 05/08/24 0436 09/05/24 1013  NA 142   < > 140  --  140 142  K 4.7   < > 4.2  --  3.7 4.3  CL 108   < > 108  --  99 109  CO2 28   < > 22  --  27 23  GLUCOSE 89   < > 110*  --  100* 94  BUN 39*   < > 31*  --  31* 28*  CREATININE 1.47*   < > 1.23*  --  1.56* 1.34*  CALCIUM  9.6   < > 9.5  --  9.8 10.0  GFRNONAA  --   --  41*  --  31* 37*  PROT 6.2  --   --  6.4*  --  6.7  ALBUMIN  --   --   --  3.8  --  4.3  AST 18  --   --  21  --  20  ALT 7  --   --  7  --  7  ALKPHOS  --   --   --  55  --  74  BILITOT 0.9  --   --  1.4*  --  0.9  BILIDIR  --   --   --  0.2  --   --   IBILI  --   --   --  1.2*  --   --    < > = values in this interval not displayed.     No results found.  ASSESSMENT & PLAN:   Symptomatic anemia # Chronic anemia-normocytic recently getting worse. [October 2085 hemoglobin 9.6 ferritin 12-nephrology Dr. Adelbert  Patient is quite symptomatic from anemia . The etiology is likely chronic renal disease/Hiatal hernia ON Gentle iron- one a day.   # Today hemoglobin- is 10.1- over all stable- continue PO iron; Today awaiting LDH peripheral smear; haptoglobin; erythropoietin ; iron studies ferritin B12 folic acid; reticulocyte count multiple myeloma panel.  Kappa lambda light chain ratio. HOLD of bone marrow Biopsy at this time; pending above work up.   # CKD- stage [Dr. Kolluru]- III- stable.   # A-fib-on Xarelto  [Dr.Agbor-Etang]-stable.  # DISPOSITION: # NO venofer-   # follow up 4  months- MD ; labs- cbc/cmp; iron studies; ferritin; possible venofer-  Dr.B   Cindy JONELLE Joe, MD 09/05/2024 11:58 AM

## 2024-09-05 NOTE — Progress Notes (Signed)
 Patient has no concerns

## 2024-09-05 NOTE — Assessment & Plan Note (Addendum)
#   Chronic anemia-normocytic recently getting worse. [October 2085 hemoglobin 9.6 ferritin 12-nephrology Dr. Adelbert  Patient is quite symptomatic from anemia . The etiology is likely chronic renal disease/Hiatal hernia ON Gentle iron- one a day.   # Today hemoglobin- is 10.1- over all stable- continue PO iron; Today awaiting LDH peripheral smear; haptoglobin; erythropoietin ; iron studies ferritin B12 folic acid; reticulocyte count multiple myeloma panel.  Kappa lambda light chain ratio. HOLD of bone marrow Biopsy at this time; pending above work up.   # CKD- stage [Dr. Kolluru]- III- stable.   # A-fib-on Xarelto  [Dr.Agbor-Etang]-stable.  # DISPOSITION: # NO venofer-   # follow up 4  months- MD ; labs- cbc/cmp; iron studies; ferritin; possible venofer-  Dr.B

## 2024-09-06 LAB — ERYTHROPOIETIN: Erythropoietin: 34 m[IU]/mL — ABNORMAL HIGH (ref 2.6–18.5)

## 2024-09-06 LAB — KAPPA/LAMBDA LIGHT CHAINS
Kappa free light chain: 29.4 mg/L — ABNORMAL HIGH (ref 3.3–19.4)
Kappa, lambda light chain ratio: 2.18 — ABNORMAL HIGH (ref 0.26–1.65)
Lambda free light chains: 13.5 mg/L (ref 5.7–26.3)

## 2024-09-07 ENCOUNTER — Encounter: Payer: Self-pay | Admitting: Physician Assistant

## 2024-09-07 ENCOUNTER — Ambulatory Visit: Attending: Physician Assistant | Admitting: Physician Assistant

## 2024-09-07 VITALS — BP 150/80 | HR 66 | Ht 59.0 in | Wt 146.8 lb

## 2024-09-07 DIAGNOSIS — I340 Nonrheumatic mitral (valve) insufficiency: Secondary | ICD-10-CM | POA: Insufficient documentation

## 2024-09-07 DIAGNOSIS — T466X5A Adverse effect of antihyperlipidemic and antiarteriosclerotic drugs, initial encounter: Secondary | ICD-10-CM | POA: Diagnosis present

## 2024-09-07 DIAGNOSIS — I4821 Permanent atrial fibrillation: Secondary | ICD-10-CM | POA: Diagnosis present

## 2024-09-07 DIAGNOSIS — R0609 Other forms of dyspnea: Secondary | ICD-10-CM | POA: Insufficient documentation

## 2024-09-07 DIAGNOSIS — N1832 Chronic kidney disease, stage 3b: Secondary | ICD-10-CM | POA: Diagnosis not present

## 2024-09-07 DIAGNOSIS — E785 Hyperlipidemia, unspecified: Secondary | ICD-10-CM | POA: Insufficient documentation

## 2024-09-07 DIAGNOSIS — I10 Essential (primary) hypertension: Secondary | ICD-10-CM | POA: Insufficient documentation

## 2024-09-07 DIAGNOSIS — Z79899 Other long term (current) drug therapy: Secondary | ICD-10-CM | POA: Insufficient documentation

## 2024-09-07 DIAGNOSIS — G459 Transient cerebral ischemic attack, unspecified: Secondary | ICD-10-CM | POA: Insufficient documentation

## 2024-09-07 DIAGNOSIS — I351 Nonrheumatic aortic (valve) insufficiency: Secondary | ICD-10-CM | POA: Insufficient documentation

## 2024-09-07 DIAGNOSIS — M791 Myalgia, unspecified site: Secondary | ICD-10-CM | POA: Diagnosis not present

## 2024-09-07 DIAGNOSIS — I5032 Chronic diastolic (congestive) heart failure: Secondary | ICD-10-CM | POA: Diagnosis present

## 2024-09-07 DIAGNOSIS — Z789 Other specified health status: Secondary | ICD-10-CM | POA: Diagnosis not present

## 2024-09-07 DIAGNOSIS — T466X5D Adverse effect of antihyperlipidemic and antiarteriosclerotic drugs, subsequent encounter: Secondary | ICD-10-CM

## 2024-09-07 LAB — MULTIPLE MYELOMA PANEL, SERUM
Albumin SerPl Elph-Mcnc: 3.7 g/dL (ref 2.9–4.4)
Albumin/Glob SerPl: 1.5 (ref 0.7–1.7)
Alpha 1: 0.2 g/dL (ref 0.0–0.4)
Alpha2 Glob SerPl Elph-Mcnc: 0.7 g/dL (ref 0.4–1.0)
B-Globulin SerPl Elph-Mcnc: 1 g/dL (ref 0.7–1.3)
Gamma Glob SerPl Elph-Mcnc: 0.6 g/dL (ref 0.4–1.8)
Globulin, Total: 2.6 g/dL (ref 2.2–3.9)
IgA: 80 mg/dL (ref 64–422)
IgG (Immunoglobin G), Serum: 742 mg/dL (ref 586–1602)
IgM (Immunoglobulin M), Srm: 46 mg/dL (ref 26–217)
Total Protein ELP: 6.3 g/dL (ref 6.0–8.5)

## 2024-09-07 MED ORDER — FUROSEMIDE 20 MG PO TABS: 10.0000 mg | ORAL_TABLET | Freq: Every day | ORAL | 3 refills | Status: DC

## 2024-09-07 NOTE — Patient Instructions (Signed)
 Medication Instructions:  Your physician recommends the following medication changes.  START TAKING: Lasix  10 mg daily for 10 days - let us  know how you're doing with this, please  *If you need a refill on your cardiac medications before your next appointment, please call your pharmacy*  Lab Work: Your provider would like for you to have following labs drawn today BNP.   If you have labs (blood work) drawn today and your tests are completely normal, you will receive your results only by: MyChart Message (if you have MyChart) OR A paper copy in the mail If you have any lab test that is abnormal or we need to change your treatment, we will call you to review the results.  Follow-Up: At [facility], you and your health needs are our priority.  As part of our continuing mission to provide you with exceptional heart care, our providers are all part of one team.  This team includes your primary Cardiologist (physician) and Advanced Practice Providers or APPs (Physician Assistants and Nurse Practitioners) who all work together to provide you with the care you need, when you need it.  Your next appointment:   Jan 27   Provider:   You may see Redell Cave, MD or Bernardino Bring, PA-C

## 2024-09-09 LAB — BRAIN NATRIURETIC PEPTIDE: BNP: 444 pg/mL — ABNORMAL HIGH (ref 0.0–100.0)

## 2024-09-10 ENCOUNTER — Ambulatory Visit: Payer: Self-pay | Admitting: Physician Assistant

## 2024-09-19 ENCOUNTER — Ambulatory Visit: Admitting: Physician Assistant

## 2024-09-20 NOTE — Progress Notes (Unsigned)
 "  Cardiology Office Note    Date:  09/21/2024   ID:  Brittney, [name] 08-14-1930, MRN [id number]  PCP:  [name], NP  Cardiologist:  Redell Cave, MD  Electrophysiologist:  None   Chief Complaint: Follow-up  History of Present Illness:   Brittney [name] is a 89 y.o. female with history of permanent A-fib, HFpEF, mitral regurgitation, TIA, CKD stage IIIb with anemia of chronic disease, chronic dyspnea, hiatal hernia, HTN, HLD, and lumbar radiculitis who presents for follow-up of chronic dyspnea.  She was previously followed by [facility], and underwent cardiac cath in 2008 which showed no angiographically apparent CAD in the major epicardial arteries.  Calcium  score in 2013 of 129.73.  Nuclear stress test in 2015 showed no evidence of ischemia with normal LV function.  She was admitted to the hospital in 2019 with a suspected TIA.  Carotid artery ultrasound at that time showed no hemodynamically significant stenosis in the bilateral carotid arteries with antegrade flow of the bilateral vertebral arteries.  Echo demonstrated an EF of 45 to 50% with hypokinesis of the inferior myocardium and a mildly dilated left atrium.  Notes indicate she was diagnosed with A-fib in 10/2020, and initiated on apixaban .  She established care with our group in 2022.  Subsequent echo in 07/2021 showed a stable LV systolic function with an EF of 45 to 50%, indeterminate LV diastolic function parameters, normal RV systolic function and ventricular cavity size, severely dilated left atrium, mildly dilated right atrium, mild to moderate mitral regurgitation, mild to moderate tricuspid regurgitation, and an estimated right atrial pressure of 8 mmHg.  She was seen in our office in 08/2021 and reported a possible TIA several weeks prior.  She was initiated on aspirin  with continuation of apixaban  and referred to neurology with recommendation to be maintained on apixaban  and aspirin .  MRI of the brain  showed no evidence of acute intracranial abnormality with mild to moderate chronic small vessel ischemic changes that had progressed from study in 2019 as well as mild generalized cerebral atrophy.  She was seen in the office in 08/2022 noting a several month history of exertional shortness of breath when walking up an incline without frank chest pain.  Symptoms were largely unchanged when compared to her prior visit and would last for a couple of seconds with spontaneous resolution.  She also wondered if rosuvastatin  was contributing to some of her lower extremity discomfort.  In this setting, she underwent echo in 09/2022 which showed an EF of 60 to 65%, no regional wall motion abnormalities, mild LVH, grade 2 diastolic dysfunction, normal RV systolic function and ventricular cavity size, mildly elevated RVSP at 38.8 mmHg, moderately dilated left atrium, mild to moderate mitral regurgitation, mild to moderate tricuspid regurgitation, mild aortic insufficiency, and an estimated right atrial pressure of 3 mmHg.   She was evaluated at her PCP's office on 04/08/2023 noting an increase in fatigue and dyspnea with associated chest discomfort.  Reported ankle swelling at the end of the day.  Chest x-ray was obtained which demonstrated vascular congestion.  In this setting, primary cardiologist initiated the patient on furosemide  20 mg daily.  However, patient subsequently notified our office that she was experiencing dizziness with initiation of furosemide .  With this, furosemide  was held on 04/28/2023.   She was seen in the office on 04/29/2023 noting an increase in dizziness following initiation of furosemide .  She felt like her breathing was overall stable with continued shortness of breath and chest  heaviness with ambulation that was longstanding.  There was no significant lower extremity swelling.  Toprol  was reduced to 12.5 mg daily with recommendation to continue to hold furosemide .  To further evaluate her  longstanding exertional dyspnea, she underwent Lexiscan  MPI on 05/07/2023 that showed no evidence of ischemia or infarction with normal LV systolic function and was overall low risk.  CT attenuated corrected images demonstrated coronary artery calcification and aortic atherosclerosis.   She was seen in the office on 06/30/2023 noting unchanged chronic dyspnea.  Orthostatic dizziness improved following reduction of Toprol -XL and discontinuation of scheduled furosemide .   She was seen in the ED on 09/03/2023 after sustaining a mechanical fall, attempting to sit down at her kitchen [place] when the chair slid out from underneath her.  CT head showed no acute intracranial process.  CT cervical spine showed no acute fracture or traumatic finding.   She was seen in the office in 08/2023 and felt like her chronic dyspnea was a little improved.  She was taking furosemide  10 mg once daily dating back to November 2024.  Off rosuvastatin , she noted some improvement in myalgias, though not resolution.  Chest x-ray in 12/2023 showed suspected atelectasis and possible minimal pleural effusions.  CBC obtained at that time showed a downtrending hemoglobin of 9.6.  She was seen in our office in 02/2024 reporting stable chronic dyspnea.  She self discontinued furosemide  in 12/2023, reporting she felt like this was contributing to abdominal discomfort.  She was evaluated by pulmonology for chronic dyspnea on 02/2024 with high-resolution chest CT in 03/2024 showing small airway disease, small bilateral pleural effusions, possibly loculated, moderate paraesophageal hernia with intrathoracic position of the gastric body and fundus, compressive atelectasis of the left lung bases secondary to hernia sac.   She was admitted to the hospital in 04/2024 with chest and back pain with deep inspiration and exertional dyspnea.  BNP 712.  High-sensitivity troponin negative.  Hemoglobin 9.5.  She declined CTA chest.  Noncontrast chest CT showed lower  lobe peribronchial thickening, bilateral pleural effusions, and ground glass attenuation.  Echo during the admission showed an EF of 55 to 60%, no regional wall motion abnormalities, mild LVH, grade 2 diastolic dysfunction, normal RV systolic function and ventricular cavity size, moderately dilated left atrium, moderate mitral regurgitation with moderate mitral annular calcification, mild aortic insufficiency, and aortic valve sclerosis without evidence of stenosis.   She followed up with pulmonology in late 04/2024 feeling much better following hospitalization.  Point-of-care ultrasound showed only a small loculated pleural effusion on the left side.  It was felt the patient's symptoms were largely being driven by HFpEF compounded by moderate mitral regurgitation and in the setting of her hiatal hernia.  She was seen in our office in 05/2024 and was doing very well from a cardiac perspective, noting significant improvement in her breathing and had not taken any as needed furosemide .  It was felt her dyspnea was multifactorial including HFpEF and moderate mitral regurgitation which were exacerbated by anemia of chronic disease, as well as moderate hiatal hernia.  Addition of SGLT2 inhibitor has been deferred due to urinary frequency/urgency.  She was last seen in the office 09/07/2024 reporting chronic exertional dyspnea with minimal activity without frank chest pain, lower extremity swelling, or orthopnea.  Weight was stable at home.  She reported last having taken a furosemide  approximately 1 month prior.  She was advised to take furosemide  10 mg daily for the next week as she has previously not tolerated higher  doses due to orthostasis.  Labs obtained at that time showed a BNP of 444.  She comes in today accompanied by a friend and is doing much better, with significant improvement in dyspnea with breathing back to baseline.  She is now able to ambulate at her usual pace without cardiac limitation.  No chest  pain.  Intermittent dizziness stable.  No near-syncope or syncope.  No lower extremity swelling or progressive orthopnea.  Her weight is down 1 pound today when compared to her visit on 09/07/2024.  She took furosemide  10 mg daily for 10 days and has been off furosemide  for the past 3 days.  Overall feels well and is pleased with the improvement in her breathing.   Labs independently reviewed: 08/2024 - BNP 444, BUN 20, serum creatinine 1.34, potassium 4.3, albumin 4.3, AST/ALT normal, Hgb 10.1, PLT 474 04/2024 - A1c 5.4, TSH normal, TC 152, TG 60, HDL 58, LDL 82, magnesium  2.0  Past Medical History:  Diagnosis Date   Adopted person    Allergy    Anemia    Angina pectoris    Aortic atherosclerosis    Arthritis    Atrial fibrillation (HCC)    a. ) CHA2DS2-VASc = 7 (age x 2, sex, HTN, TIA x2, aortic plaque);  b.) rate/rhythm maintained on oral metoprolol  succinate; chronically anticoagulated using dose reduced apixaban .   Bilateral carotid artery disease    CAD (coronary artery disease)    a.) LHC 02/09/2007: EF 60%, LVEDP 25 mmHg, normal cors; b.) cCTA 11/27/2011: Ca score 129.73 --> 1.17 RCA, 16.36 LM, 112.21 LAD, 0 LCx; c.) MPI 03/13/2014 - normal.   Cataract    CKD (chronic kidney disease), stage IV (HCC)    Diastolic dysfunction 02/09/2008   a.) LHC 02/09/2008: EF 60%, LVEDP 25 mmHg; b.) TTE 08/07/2010: EF 65%, mild LVH, mild MR, G2DD; b.) TTE 08/20/2018: EF 45-50%, inferior HK, mild LAE; c.) TTE 08/11/2021: EF 45-50%. 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   Left carotid bruit    Long term current use of anticoagulant    a.) apixaban    Lumbar stenosis    Lymphocytic colitis    Osteoporosis    TIA (transient ischemic attack) 07/2018   TIA (transient ischemic attack) 08/2021   Vitamin B12 deficiency    Wears  hearing BILATERAL aids    Wears partial (bottom) dentures     Past Surgical History:  Procedure Laterality Date   BREAST EXCISIONAL BIOPSY Right 36 years ago   neg   BUNIONECTOMY Bilateral    COLONOSCOPY N/A 07/02/2008   KNEE ARTHROPLASTY Right 03/11/2022   Procedure: COMPUTER ASSISTED TOTAL KNEE ARTHROPLASTY;  Surgeon: Mardee Lynwood SQUIBB, MD;  Location: [facility] ORS;  Service: Orthopedics;  Laterality: Right;   KNEE ARTHROSCOPY Right 2006   LEFT HEART CATH AND CORONARY ANGIOGRAPHY Left 02/09/2007   Procedure: LEFT HEART CATH AND CORONARY ANGIOGRAPHY; Location: [facility]   TONSILLECTOMY AND ADENOIDECTOMY Bilateral    TOTAL ABDOMINAL HYSTERECTOMY W/ BILATERAL SALPINGOOPHORECTOMY N/A    TOTAL KNEE ARTHROPLASTY Left 04/26/2014   TUBAL LIGATION      Current Medications: Active Medications[1]  Allergies:   Lisinopril, Mixed grasses, Bee venom, Lactose, Azithromycin, Baclofen, Iodine, Morphine  and codeine, M. mercenaria, Oyster extract, and Sulfa antibiotics   Social History   Socioeconomic  History   Marital status: Widowed    Spouse name: Not on file   Number of children: Not on file   Years of education: Not on file   Highest education level: Not on file  Occupational History   Not on file  Tobacco Use   Smoking status: Never    Passive exposure: Past (60yrs of exporsure from parents growing up)   Smokeless tobacco: Never  Vaping Use   Vaping status: Never Used  Substance and Sexual Activity   Alcohol use: Not Currently   Drug use: Never   Sexual activity: Not Currently  Other Topics Concern   Not on file  Social History Narrative   Not on file   Social Drivers of Health   Tobacco Use: Low Risk (09/21/2024)   Patient History    Smoking Tobacco Use: Never    Smokeless Tobacco Use: Never    Passive Exposure: Past  Financial Resource Strain: Low Risk  (03/14/2024)   Received from [facility]   Overall Financial Resource Strain (CARDIA)    Difficulty  of Paying Living Expenses: Not hard at all  Food Insecurity: No Food Insecurity (07/04/2024)   Epic    Worried About Radiation Protection Practitioner of Food in the Last Year: Never true    Ran Out of Food in the Last Year: Never true  Transportation Needs: No Transportation Needs (07/04/2024)   Epic    Lack of Transportation (Medical): No    Lack of Transportation (Non-Medical): No  Physical Activity: Not on file  Stress: Not on file  Social Connections: Not on file  Depression (PHQ2-9): Low Risk (08/08/2024)   Depression (PHQ2-9)    PHQ-2 Score: 0  Alcohol Screen: Not on file  Housing: Low Risk (07/04/2024)   Epic    Unable to Pay for Housing in the Last Year: No    Number of Times Moved in the Last Year: 0    Homeless in the Last Year: No  Utilities: Not At Risk (07/04/2024)   Epic    Threatened with loss of utilities: No  Health Literacy: Not on file     Family History:  The patient's family history includes Heart disease in her sister; Stroke in her brother. She was adopted.  ROS:   12-point review of systems is negative unless otherwise noted in the HPI.   EKGs/Labs/Other Studies Reviewed:    Studies reviewed were summarized above. The additional studies were reviewed today:  2D echo 05/08/2024: 1. Left ventricular ejection fraction, by estimation, is 55 to 60%. The  left ventricle has normal function. The left ventricle has no regional  wall motion abnormalities. There is mild left ventricular hypertrophy.  Left ventricular diastolic parameters  are consistent with Grade II diastolic dysfunction (pseudonormalization).   2. Right ventricular systolic function is normal. The right ventricular  size is normal. Tricuspid regurgitation signal is inadequate for assessing  PA pressure.   3. Left atrial size was moderately dilated.   4. The mitral valve is normal in structure. Moderate mitral valve  regurgitation. No evidence of mitral stenosis. Moderate mitral annular  calcification.    5. The aortic valve is normal in structure. Aortic valve regurgitation is  mild. Aortic valve sclerosis/calcification is present, without any  evidence of aortic stenosis.   6. The inferior vena cava is normal in size with <50% respiratory  variability, suggesting right atrial pressure of 8 mmHg.  __________   Lexiscan  MPI 05/07/2023:   The study is normal. The  study is low risk.   No ST deviation was noted.   LV perfusion is normal. There is no evidence of ischemia. There is no evidence of infarction.   Left ventricular function is normal. End diastolic cavity size is normal. End systolic cavity size is normal.   CT attenuation images show evidence of aortic and coronary calcifications. __________   2D echo 10/02/2022: 1. Left ventricular ejection fraction, by estimation, is 60 to 65%. The  left ventricle has normal function. The left ventricle has no regional  wall motion abnormalities. There is mild left ventricular hypertrophy.  Left ventricular diastolic parameters  are consistent with Grade II diastolic dysfunction (pseudonormalization).  The average left ventricular global longitudinal strain is -8.0 %. The  global longitudinal strain is abnormal.   2. Right ventricular systolic function is normal. The right ventricular  size is normal. There is mildly elevated pulmonary artery systolic  pressure. The estimated right ventricular systolic pressure is 38.8 mmHg.   3. Left atrial size was moderately dilated.   4. The mitral valve is normal in structure. Mild to moderate mitral valve  regurgitation. No evidence of mitral stenosis.   5. Tricuspid valve regurgitation is mild to moderate.   6. The aortic valve is tricuspid. Aortic valve regurgitation is mild. No  aortic stenosis is present.   7. The inferior vena cava is normal in size with greater than 50%  respiratory variability, suggesting right atrial pressure of 3 mmHg.  __________   2D echo 08/11/2021: 1. Left ventricular  ejection fraction, by estimation, is 45 to 50%. The  left ventricle has mildly decreased function. The left ventricle has no  regional wall motion abnormalities. Left ventricular diastolic parameters  are indeterminate.   2. Right ventricular systolic function is normal. The right ventricular  size is normal.   3. Left atrial size was severely dilated.   4. Right atrial size was mildly dilated.   5. The mitral valve is normal in structure. Mild to moderate mitral valve  regurgitation.   6. Tricuspid valve regurgitation is mild to moderate.   7. The aortic valve is tricuspid. Aortic valve regurgitation is not  visualized.   8. The inferior vena cava is normal in size with <50% respiratory  variability, suggesting right atrial pressure of 8 mmHg.  __________   2D echo 08/20/2018: - Left ventricle: The cavity size was mildly dilated. Wall    thickness was normal. Systolic function was mildly reduced. The    estimated ejection fraction was in the range of 45% to 50%.    Hypokinesis of the inferior myocardium.  - Left atrium: The atrium was mildly dilated.   Impressions:   - Mildly reduced LVF    Inferior Hypokinesis    EF=45-50%    Normal Right side. No cardiac source of emboli was indentified.  __________   Carotid artery ultrasound 08/19/2018: IMPRESSION: No hemodynamically significant stenosis is noted in either cervical carotid artery.   EKG:  EKG is ordered today.  The EKG ordered today demonstrates A-fib, 70 bpm, incomplete RBBB, LVH, consistent with prior tracings  Recent Labs: 05/08/2024: Magnesium  2.0; TSH 3.433 09/05/2024: ALT 7; BUN 28; Creatinine 1.34; Hemoglobin 10.1; Platelet Count 474; Potassium 4.3; Sodium 142 09/07/2024: BNP 444.0  Recent Lipid Panel    Component Value Date/Time   CHOL 152 05/08/2024 0436   TRIG 60 05/08/2024 0436   HDL 58 05/08/2024 0436   CHOLHDL 2.6 05/08/2024 0436   VLDL 12 05/08/2024 0436   LDLCALC 82  05/08/2024 0436   LDLCALC 90  04/12/2023 0735   LDLDIRECT 58 09/04/2022 1129    PHYSICAL EXAM:    VS:  BP 120/70 (BP Location: Left Arm, Patient Position: Sitting, Cuff Size: Normal)   Pulse 70 Comment: 65 oximeter  Ht 4' 11 (1.499 m)   Wt 145 lb (65.8 kg)   SpO2 99%   BMI 29.29 kg/m   BMI: Body mass index is 29.29 kg/m.  Physical Exam Vitals reviewed.  Constitutional:      Appearance: She is well-developed.  HENT:     Head: Normocephalic and atraumatic.  Eyes:     General:        Right eye: No discharge.        Left eye: No discharge.  Cardiovascular:     Rate and Rhythm: Normal rate. Rhythm irregularly irregular.     Pulses:          Posterior tibial pulses are 2+ on the right side and 2+ on the left side.     Heart sounds: S1 normal and S2 normal. Heart sounds not distant. No midsystolic click and no opening snap. Murmur heard.     Systolic murmur is present with a grade of 1/6 at the upper left sternal border.     No friction rub.  Pulmonary:     Effort: Pulmonary effort is normal. No respiratory distress.     Breath sounds: Normal breath sounds. No decreased breath sounds, wheezing, rhonchi or rales.  Musculoskeletal:     Cervical back: Normal range of motion.     Right lower leg: No edema.     Left lower leg: No edema.  Skin:    General: Skin is warm and dry.     Nails: There is no clubbing.  Neurological:     Mental Status: She is alert and oriented to person, place, and time.  Psychiatric:        Speech: Speech normal.        Behavior: Behavior normal.        Thought Content: Thought content normal.        Judgment: Judgment normal.     Wt Readings from Last 3 Encounters:  09/21/24 145 lb (65.8 kg)  09/07/24 146 lb 12.8 oz (66.6 kg)  09/05/24 148 lb (67.1 kg)     ASSESSMENT & PLAN:   Permanent A-fib: Ventricular rates remain well-controlled on Toprol -XL 25 mg.  CHA2DS2-VASc at least 7 (HTN, age x 2, TIA x 2, vascular disease, sex category).  She remains on rivaroxaban  15 mg  daily with a creatinine clearance of 27.3.  No falls or symptoms concerning for bleeding.  Recent CBC demonstrated stable to improved hemoglobin.  HFpEF with chronic dyspnea: Dyspnea is much improved, breathing back to baseline following outpatient diuresis.  Lung sounds are normal bilaterally now.  Suspect her dyspnea is multifactorial including HFpEF and moderate mitral regurgitation which are exacerbated by anemia of chronic disease, as well as known moderate hiatal hernia.  She will start taking furosemide  10 mg on Mondays, Wednesdays, and Fridays rather than as needed, as she had not taken furosemide  for approximately 1 month leading to progressive dyspnea at her visit earlier this month.  Follow-up BMP in 1 to 2 weeks to ensure stable renal function.  Given improvement in dyspnea we have agreed to defer ischemic evaluation and/or RHC for now.  Not on MRA secondary to underlying CKD.  In follow-up, if breathing remains stable consider addition of SGLT2 inhibitor.  HTN:  Blood pressure is well-controlled in the office today.  She remains on Toprol -XL 25 mg daily.  HLD with statin intolerance: LDL 82 in 04/2024.  Longer on statin therapy secondary to myalgia.  Mitral regurgitation/aortic insufficiency: Echo in 04/2024 demonstrated moderate mitral regurgitation with moderate mitral annular calcification along with mild aortic insufficiency.  Monitor with periodic echo.  History of TIA: No new deficits.  Remains on renally dosed rivaroxaban .  CKD stage IIIb with normocytic anemia: Renal disease followed by nephrology.  Symptomatic anemia followed by hematology and remains on iron supplementation.  Trend BMP in 1 to 2 weeks as outlined above.    Disposition: F/u with Dr. Darliss or an APP in 2 months.   Medication Adjustments/Labs and Tests Ordered: Current medicines are reviewed at length with the patient today.  Concerns regarding medicines are outlined above. Medication changes, Labs and  Tests ordered today are summarized above and listed in the Patient Instructions accessible in Encounters.   Signed, Bernardino Bring, PA-C 09/21/2024 12:19 PM     [facility] [place] [address] [place], [place] [address] [phone]     [1]  Current Meds  Medication Sig   acetaminophen  (TYLENOL ) 500 MG tablet Take 1,000 mg by mouth 2 (two) times daily as needed.   allopurinol  (ZYLOPRIM ) 100 MG tablet TAKE 1/2 TABLET BY MOUTH ONCE DAILY   Cholecalciferol  (VITAMIN D ) 50 MCG (2000 UT) tablet Take 2,000 Units by mouth daily.   Cyanocobalamin  (B-12) 1000 MCG TABS TAKE ONE TABLET BY MOUTH ONCE DAILY   EPINEPHrine  0.3 mg/0.3 mL IJ SOAJ injection Inject 0.3 mg into the muscle as needed for anaphylaxis.   fluticasone  (FLONASE ) 50 MCG/ACT nasal spray Place 2 sprays into both nostrils as needed.   metoprolol  succinate (TOPROL -XL) 25 MG 24 hr tablet TAKE 1 TABLET BY MOUTH DAILY   olopatadine  (PATANOL) 0.1 % ophthalmic solution Place 1 drop into both eyes 2 (two) times daily as needed for allergies.   XARELTO  15 MG TABS tablet TAKE ONE TABLET BY MOUTH EVERY DAY WITH SUPPER   [DISCONTINUED] furosemide  (LASIX ) 20 MG tablet Take 0.5 tablets (10 mg total) by mouth daily. Take for 10 days and we'll assess at your 08/2724 appointment   "

## 2024-09-21 ENCOUNTER — Encounter: Payer: Self-pay | Admitting: Physician Assistant

## 2024-09-21 ENCOUNTER — Ambulatory Visit: Admitting: Physician Assistant

## 2024-09-21 VITALS — BP 120/70 | HR 70 | Ht 59.0 in | Wt 145.0 lb

## 2024-09-21 DIAGNOSIS — I4821 Permanent atrial fibrillation: Secondary | ICD-10-CM | POA: Diagnosis present

## 2024-09-21 DIAGNOSIS — I340 Nonrheumatic mitral (valve) insufficiency: Secondary | ICD-10-CM | POA: Insufficient documentation

## 2024-09-21 DIAGNOSIS — N1832 Chronic kidney disease, stage 3b: Secondary | ICD-10-CM | POA: Diagnosis present

## 2024-09-21 DIAGNOSIS — I10 Essential (primary) hypertension: Secondary | ICD-10-CM | POA: Diagnosis present

## 2024-09-21 DIAGNOSIS — Z789 Other specified health status: Secondary | ICD-10-CM | POA: Diagnosis present

## 2024-09-21 DIAGNOSIS — G459 Transient cerebral ischemic attack, unspecified: Secondary | ICD-10-CM | POA: Diagnosis present

## 2024-09-21 DIAGNOSIS — Z79899 Other long term (current) drug therapy: Secondary | ICD-10-CM | POA: Insufficient documentation

## 2024-09-21 DIAGNOSIS — I351 Nonrheumatic aortic (valve) insufficiency: Secondary | ICD-10-CM | POA: Diagnosis present

## 2024-09-21 DIAGNOSIS — T466X5A Adverse effect of antihyperlipidemic and antiarteriosclerotic drugs, initial encounter: Secondary | ICD-10-CM | POA: Insufficient documentation

## 2024-09-21 DIAGNOSIS — E785 Hyperlipidemia, unspecified: Secondary | ICD-10-CM | POA: Diagnosis present

## 2024-09-21 DIAGNOSIS — R0609 Other forms of dyspnea: Secondary | ICD-10-CM | POA: Insufficient documentation

## 2024-09-21 DIAGNOSIS — T466X5D Adverse effect of antihyperlipidemic and antiarteriosclerotic drugs, subsequent encounter: Secondary | ICD-10-CM

## 2024-09-21 DIAGNOSIS — D638 Anemia in other chronic diseases classified elsewhere: Secondary | ICD-10-CM | POA: Insufficient documentation

## 2024-09-21 DIAGNOSIS — M791 Myalgia, unspecified site: Secondary | ICD-10-CM | POA: Insufficient documentation

## 2024-09-21 DIAGNOSIS — I5032 Chronic diastolic (congestive) heart failure: Secondary | ICD-10-CM | POA: Diagnosis present

## 2024-09-21 MED ORDER — FUROSEMIDE 20 MG PO TABS: 10.0000 mg | ORAL_TABLET | ORAL | 3 refills | Status: AC

## 2024-09-21 NOTE — Patient Instructions (Signed)
 Medication Instructions:  Your physician recommends the following medication changes.  START TAKING: Lasix  10 mg every Monday, Wednesday, and Friday  *If you need a refill on your cardiac medications before your next appointment, please call your pharmacy*  Lab Work: Your provider would like for you to return in 1 to 2 weeks to have the following labs drawn: BMeT.   Please go to [facility] [address] ([facility] [address], [place] [address] You do not need an appointment.  They are open from 8 am- 4:30 pm.  Lunch from 1:00 pm- 2:00 pm You DO NOT need to be fasting.   You may also go to one of the following [facility]:  [address] [place], [place] [address] Phone: [phone] Lab hours: Mon-Fri 8 am- 5 pm    Lunch 12 pm- 1 pm  [address] [place],  [place]  [address]  [place]  Phone: [phone] Lab hours: 7 am- 4 pm Lunch 12 pm-1 pm   [address] [place],  [place]  [address]  [place]  Phone: [phone] Lab hours: Mon-Fri 8 am- 5 pm    Lunch 12 pm- 1 pm  If you have labs (blood work) drawn today and your tests are completely normal, you will receive your results only by: MyChart Message (if you have MyChart) OR A paper copy in the mail If you have any lab test that is abnormal or we need to change your treatment, we will call you to review the results.  Follow-Up: At [facility], you and your health needs are our priority.  As part of our continuing mission to provide you with exceptional heart care, our providers are all part of one team.  This team includes your primary Cardiologist (physician) and Advanced Practice Providers or APPs (Physician Assistants and Nurse Practitioners) who all work together to provide you with the care you need, when you need it.  Your next appointment:   1-2 month(s)  Provider:   You may see Redell Cave, MD or Bernardino Bring

## 2024-11-21 ENCOUNTER — Ambulatory Visit: Admitting: Nurse Practitioner

## 2024-11-28 ENCOUNTER — Ambulatory Visit: Admitting: Physician Assistant

## 2024-12-12 ENCOUNTER — Ambulatory Visit: Payer: Self-pay | Admitting: Nurse Practitioner

## 2025-01-03 ENCOUNTER — Inpatient Hospital Stay: Admitting: Internal Medicine

## 2025-01-03 ENCOUNTER — Inpatient Hospital Stay

## 2025-01-31 ENCOUNTER — Ambulatory Visit: Admitting: Family
